# Patient Record
Sex: Male | Born: 1940 | Race: White | Hispanic: No | Smoking: Current every day smoker
Health system: Southern US, Community
[De-identification: ages and names within clinical notes are randomized; demographics above are authoritative.]

## PROBLEM LIST (undated history)

## (undated) DIAGNOSIS — F99 Mental disorder, not otherwise specified: Secondary | ICD-10-CM

## (undated) DIAGNOSIS — G629 Polyneuropathy, unspecified: Secondary | ICD-10-CM

## (undated) DIAGNOSIS — F32A Depression, unspecified: Secondary | ICD-10-CM

## (undated) DIAGNOSIS — F329 Major depressive disorder, single episode, unspecified: Secondary | ICD-10-CM

## (undated) DIAGNOSIS — F419 Anxiety disorder, unspecified: Secondary | ICD-10-CM

## (undated) DIAGNOSIS — F4310 Post-traumatic stress disorder, unspecified: Secondary | ICD-10-CM

## (undated) DIAGNOSIS — I10 Essential (primary) hypertension: Secondary | ICD-10-CM

---

## 1997-10-08 ENCOUNTER — Emergency Department (HOSPITAL_COMMUNITY): Admission: EM | Admit: 1997-10-08 | Discharge: 1997-10-08 | Payer: Self-pay | Admitting: Emergency Medicine

## 1997-11-05 ENCOUNTER — Emergency Department (HOSPITAL_COMMUNITY): Admission: EM | Admit: 1997-11-05 | Discharge: 1997-11-05 | Payer: Self-pay | Admitting: Emergency Medicine

## 1998-11-03 ENCOUNTER — Emergency Department (HOSPITAL_COMMUNITY): Admission: EM | Admit: 1998-11-03 | Discharge: 1998-11-03 | Payer: Self-pay | Admitting: Emergency Medicine

## 1998-11-04 ENCOUNTER — Encounter: Payer: Self-pay | Admitting: Emergency Medicine

## 1998-11-04 ENCOUNTER — Emergency Department (HOSPITAL_COMMUNITY): Admission: EM | Admit: 1998-11-04 | Discharge: 1998-11-04 | Payer: Self-pay | Admitting: Emergency Medicine

## 2001-10-12 ENCOUNTER — Emergency Department (HOSPITAL_COMMUNITY): Admission: EM | Admit: 2001-10-12 | Discharge: 2001-10-13 | Payer: Self-pay

## 2002-04-26 ENCOUNTER — Emergency Department (HOSPITAL_COMMUNITY): Admission: EM | Admit: 2002-04-26 | Discharge: 2002-04-26 | Payer: Self-pay | Admitting: Emergency Medicine

## 2002-08-14 ENCOUNTER — Emergency Department (HOSPITAL_COMMUNITY): Admission: EM | Admit: 2002-08-14 | Discharge: 2002-08-14 | Payer: Self-pay

## 2002-09-21 ENCOUNTER — Encounter: Payer: Self-pay | Admitting: Emergency Medicine

## 2002-09-21 ENCOUNTER — Inpatient Hospital Stay (HOSPITAL_COMMUNITY): Admission: EM | Admit: 2002-09-21 | Discharge: 2002-09-27 | Payer: Self-pay

## 2002-12-31 ENCOUNTER — Emergency Department (HOSPITAL_COMMUNITY): Admission: AD | Admit: 2002-12-31 | Discharge: 2002-12-31 | Payer: Self-pay

## 2003-01-21 ENCOUNTER — Emergency Department (HOSPITAL_COMMUNITY): Admission: EM | Admit: 2003-01-21 | Discharge: 2003-01-21 | Payer: Self-pay | Admitting: Emergency Medicine

## 2003-10-07 ENCOUNTER — Inpatient Hospital Stay (HOSPITAL_COMMUNITY): Admission: EM | Admit: 2003-10-07 | Discharge: 2003-10-08 | Payer: Self-pay | Admitting: Emergency Medicine

## 2003-10-08 ENCOUNTER — Inpatient Hospital Stay (HOSPITAL_COMMUNITY): Admission: AD | Admit: 2003-10-08 | Discharge: 2003-10-12 | Payer: Self-pay | Admitting: Psychiatry

## 2003-10-08 ENCOUNTER — Encounter (INDEPENDENT_AMBULATORY_CARE_PROVIDER_SITE_OTHER): Payer: Self-pay | Admitting: *Deleted

## 2003-10-19 ENCOUNTER — Inpatient Hospital Stay (HOSPITAL_COMMUNITY): Admission: RE | Admit: 2003-10-19 | Discharge: 2003-10-24 | Payer: Self-pay | Admitting: Psychiatry

## 2003-10-20 ENCOUNTER — Emergency Department (HOSPITAL_COMMUNITY): Admission: EM | Admit: 2003-10-20 | Discharge: 2003-10-20 | Payer: Self-pay | Admitting: Emergency Medicine

## 2003-11-02 ENCOUNTER — Emergency Department (HOSPITAL_COMMUNITY): Admission: EM | Admit: 2003-11-02 | Discharge: 2003-11-03 | Payer: Self-pay | Admitting: Emergency Medicine

## 2003-11-23 ENCOUNTER — Inpatient Hospital Stay (HOSPITAL_COMMUNITY): Admission: EM | Admit: 2003-11-23 | Discharge: 2003-11-30 | Payer: Self-pay | Admitting: Psychiatry

## 2006-11-30 ENCOUNTER — Emergency Department (HOSPITAL_COMMUNITY): Admission: EM | Admit: 2006-11-30 | Discharge: 2006-12-01 | Payer: Self-pay | Admitting: Emergency Medicine

## 2006-12-01 ENCOUNTER — Ambulatory Visit: Payer: Self-pay | Admitting: *Deleted

## 2006-12-01 ENCOUNTER — Inpatient Hospital Stay (HOSPITAL_COMMUNITY): Admission: AD | Admit: 2006-12-01 | Discharge: 2006-12-08 | Payer: Self-pay | Admitting: *Deleted

## 2007-01-28 ENCOUNTER — Other Ambulatory Visit: Payer: Self-pay | Admitting: Emergency Medicine

## 2007-01-29 ENCOUNTER — Ambulatory Visit: Payer: Self-pay | Admitting: Psychiatry

## 2007-01-29 ENCOUNTER — Inpatient Hospital Stay (HOSPITAL_COMMUNITY): Admission: AD | Admit: 2007-01-29 | Discharge: 2007-02-04 | Payer: Self-pay | Admitting: Psychiatry

## 2007-02-27 ENCOUNTER — Other Ambulatory Visit: Payer: Self-pay

## 2007-02-28 ENCOUNTER — Ambulatory Visit: Payer: Self-pay | Admitting: Psychiatry

## 2007-02-28 ENCOUNTER — Inpatient Hospital Stay (HOSPITAL_COMMUNITY): Admission: AD | Admit: 2007-02-28 | Discharge: 2007-03-06 | Payer: Self-pay | Admitting: Psychiatry

## 2007-06-16 ENCOUNTER — Emergency Department (HOSPITAL_COMMUNITY): Admission: EM | Admit: 2007-06-16 | Discharge: 2007-06-16 | Payer: Self-pay | Admitting: Emergency Medicine

## 2007-07-19 ENCOUNTER — Emergency Department (HOSPITAL_COMMUNITY): Admission: EM | Admit: 2007-07-19 | Discharge: 2007-07-19 | Payer: Self-pay | Admitting: Emergency Medicine

## 2007-08-04 ENCOUNTER — Other Ambulatory Visit: Payer: Self-pay

## 2007-08-05 ENCOUNTER — Ambulatory Visit: Payer: Self-pay | Admitting: Psychiatry

## 2007-08-05 ENCOUNTER — Inpatient Hospital Stay (HOSPITAL_COMMUNITY): Admission: AD | Admit: 2007-08-05 | Discharge: 2007-08-07 | Payer: Self-pay | Admitting: Psychiatry

## 2007-08-05 ENCOUNTER — Other Ambulatory Visit: Payer: Self-pay | Admitting: Emergency Medicine

## 2007-08-06 ENCOUNTER — Encounter (HOSPITAL_COMMUNITY): Payer: Self-pay | Admitting: Psychiatry

## 2007-08-09 ENCOUNTER — Emergency Department (HOSPITAL_COMMUNITY): Admission: EM | Admit: 2007-08-09 | Discharge: 2007-08-10 | Payer: Self-pay | Admitting: Emergency Medicine

## 2007-09-21 ENCOUNTER — Encounter: Payer: Self-pay | Admitting: Emergency Medicine

## 2007-09-23 ENCOUNTER — Ambulatory Visit: Payer: Self-pay | Admitting: Psychiatry

## 2007-09-23 ENCOUNTER — Inpatient Hospital Stay (HOSPITAL_COMMUNITY): Admission: AD | Admit: 2007-09-23 | Discharge: 2007-09-24 | Payer: Self-pay | Admitting: Psychiatry

## 2007-09-30 ENCOUNTER — Other Ambulatory Visit: Payer: Self-pay | Admitting: Emergency Medicine

## 2007-10-01 ENCOUNTER — Inpatient Hospital Stay (HOSPITAL_COMMUNITY): Admission: AD | Admit: 2007-10-01 | Discharge: 2007-10-09 | Payer: Self-pay | Admitting: Psychiatry

## 2007-11-04 ENCOUNTER — Encounter: Payer: Self-pay | Admitting: Emergency Medicine

## 2007-11-05 ENCOUNTER — Inpatient Hospital Stay (HOSPITAL_COMMUNITY): Admission: AD | Admit: 2007-11-05 | Discharge: 2007-11-13 | Payer: Self-pay | Admitting: *Deleted

## 2008-03-16 ENCOUNTER — Ambulatory Visit: Payer: Self-pay | Admitting: *Deleted

## 2008-03-16 ENCOUNTER — Encounter: Payer: Self-pay | Admitting: Emergency Medicine

## 2008-03-16 ENCOUNTER — Inpatient Hospital Stay (HOSPITAL_COMMUNITY): Admission: RE | Admit: 2008-03-16 | Discharge: 2008-03-21 | Payer: Self-pay | Admitting: *Deleted

## 2008-03-28 ENCOUNTER — Emergency Department (HOSPITAL_COMMUNITY): Admission: EM | Admit: 2008-03-28 | Discharge: 2008-03-28 | Payer: Self-pay | Admitting: Emergency Medicine

## 2008-05-23 ENCOUNTER — Emergency Department (HOSPITAL_COMMUNITY): Admission: EM | Admit: 2008-05-23 | Discharge: 2008-05-23 | Payer: Self-pay | Admitting: Emergency Medicine

## 2008-11-21 ENCOUNTER — Ambulatory Visit: Payer: Self-pay | Admitting: Psychiatry

## 2008-11-21 ENCOUNTER — Inpatient Hospital Stay (HOSPITAL_COMMUNITY): Admission: AD | Admit: 2008-11-21 | Discharge: 2008-11-24 | Payer: Self-pay | Admitting: Psychiatry

## 2008-11-27 ENCOUNTER — Inpatient Hospital Stay (HOSPITAL_COMMUNITY): Admission: EM | Admit: 2008-11-27 | Discharge: 2008-12-01 | Payer: Self-pay | Admitting: Emergency Medicine

## 2008-12-01 ENCOUNTER — Inpatient Hospital Stay (HOSPITAL_COMMUNITY): Admission: RE | Admit: 2008-12-01 | Discharge: 2008-12-07 | Payer: Self-pay | Admitting: Psychiatry

## 2008-12-13 ENCOUNTER — Emergency Department (HOSPITAL_COMMUNITY): Admission: EM | Admit: 2008-12-13 | Discharge: 2008-12-13 | Payer: Self-pay | Admitting: Emergency Medicine

## 2008-12-18 ENCOUNTER — Inpatient Hospital Stay (HOSPITAL_COMMUNITY): Admission: EM | Admit: 2008-12-18 | Discharge: 2008-12-22 | Payer: Self-pay | Admitting: Emergency Medicine

## 2008-12-22 ENCOUNTER — Ambulatory Visit: Payer: Self-pay | Admitting: Psychiatry

## 2008-12-22 ENCOUNTER — Inpatient Hospital Stay (HOSPITAL_COMMUNITY): Admission: AD | Admit: 2008-12-22 | Discharge: 2009-01-04 | Payer: Self-pay | Admitting: Psychiatry

## 2008-12-28 ENCOUNTER — Other Ambulatory Visit: Payer: Self-pay | Admitting: Emergency Medicine

## 2008-12-29 ENCOUNTER — Other Ambulatory Visit (HOSPITAL_COMMUNITY): Payer: Self-pay | Admitting: Psychiatry

## 2008-12-30 ENCOUNTER — Other Ambulatory Visit (HOSPITAL_COMMUNITY): Payer: Self-pay | Admitting: Psychiatry

## 2008-12-31 ENCOUNTER — Other Ambulatory Visit (HOSPITAL_COMMUNITY): Payer: Self-pay | Admitting: Psychiatry

## 2009-01-01 ENCOUNTER — Other Ambulatory Visit (HOSPITAL_COMMUNITY): Payer: Self-pay | Admitting: Psychiatry

## 2009-01-02 ENCOUNTER — Other Ambulatory Visit (HOSPITAL_COMMUNITY): Payer: Self-pay | Admitting: Psychiatry

## 2009-01-03 ENCOUNTER — Other Ambulatory Visit (HOSPITAL_COMMUNITY): Payer: Self-pay | Admitting: Psychiatry

## 2009-01-04 ENCOUNTER — Other Ambulatory Visit (HOSPITAL_COMMUNITY): Payer: Self-pay | Admitting: Psychiatry

## 2009-01-05 ENCOUNTER — Encounter: Payer: Self-pay | Admitting: Emergency Medicine

## 2009-01-07 ENCOUNTER — Ambulatory Visit: Payer: Self-pay | Admitting: Psychiatry

## 2009-01-08 ENCOUNTER — Inpatient Hospital Stay (HOSPITAL_COMMUNITY): Admission: AD | Admit: 2009-01-08 | Discharge: 2009-01-13 | Payer: Self-pay | Admitting: Psychiatry

## 2009-01-15 ENCOUNTER — Emergency Department (HOSPITAL_COMMUNITY): Admission: EM | Admit: 2009-01-15 | Discharge: 2009-01-17 | Payer: Self-pay | Admitting: Emergency Medicine

## 2009-02-08 ENCOUNTER — Emergency Department (HOSPITAL_COMMUNITY): Admission: EM | Admit: 2009-02-08 | Discharge: 2009-02-11 | Payer: Self-pay | Admitting: Emergency Medicine

## 2009-02-11 ENCOUNTER — Emergency Department (HOSPITAL_COMMUNITY): Admission: EM | Admit: 2009-02-11 | Discharge: 2009-02-13 | Payer: Self-pay | Admitting: Emergency Medicine

## 2010-01-08 ENCOUNTER — Encounter: Admission: RE | Admit: 2010-01-08 | Discharge: 2010-01-08 | Payer: Self-pay | Admitting: Oral Surgery

## 2010-01-20 IMAGING — CT CT HEAD W/O CM
1 series · 16 of 30 positions shown, 20 images · non-contrast
Comparison: 12/13/2008

CLINICAL DATA: Headache.

CT HEAD WITHOUT CONTRAST
TECHNIQUE: Contiguous axial images were obtained from the base of
the skull through the vertex without contrast.

[Series 2: headseq 4.8 h45s · axial · 0.43mm/px · z∈[-217,-84]mm · 16 of 30 slices shown, 20 images]
[im 2/30  brain]
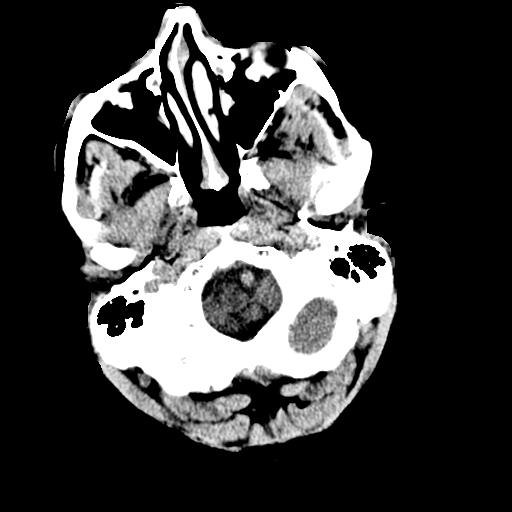
[im 2/30  bone]
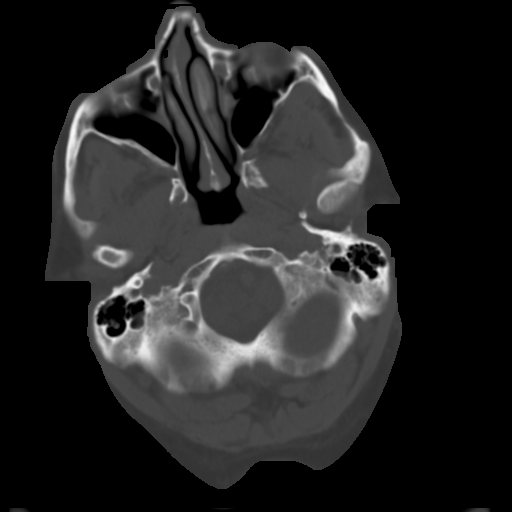
[im 4/30  brain]
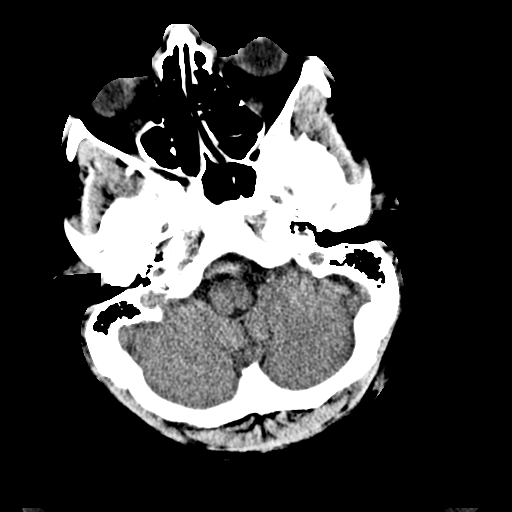
[im 6/30  brain]
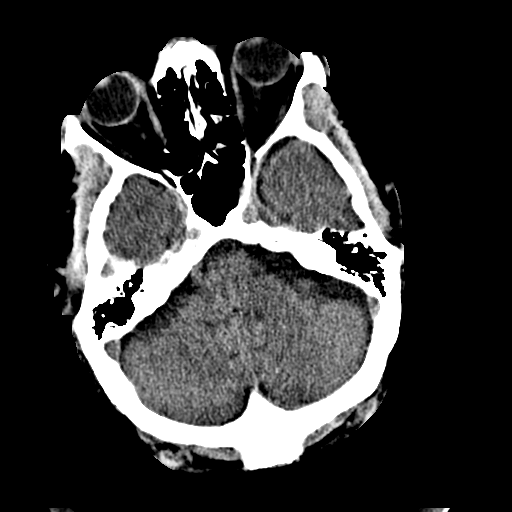
[im 8/30  brain]
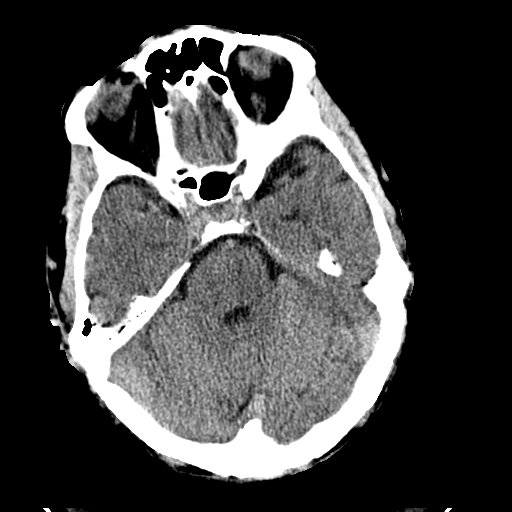
[im 9/30  brain]
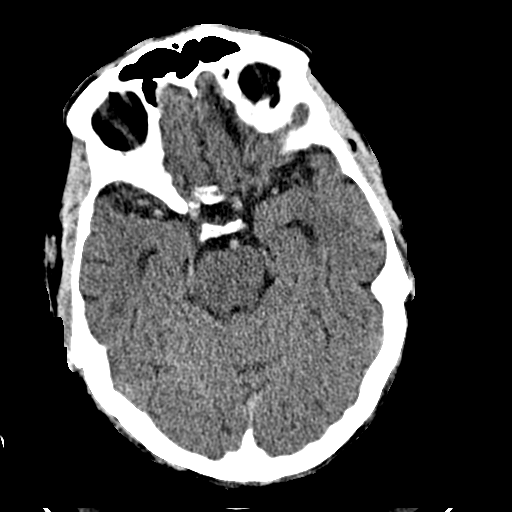
[im 9/30  bone]
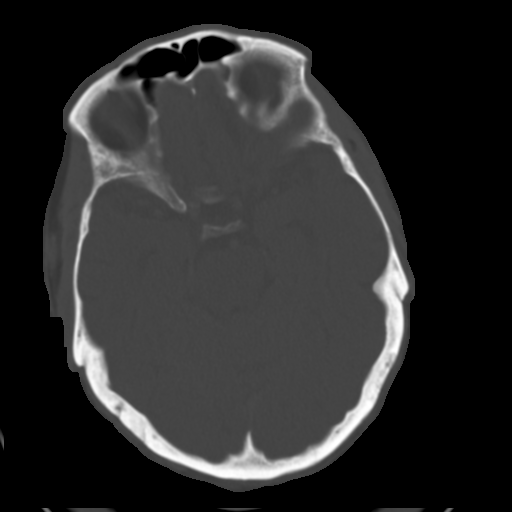
[im 11/30  brain]
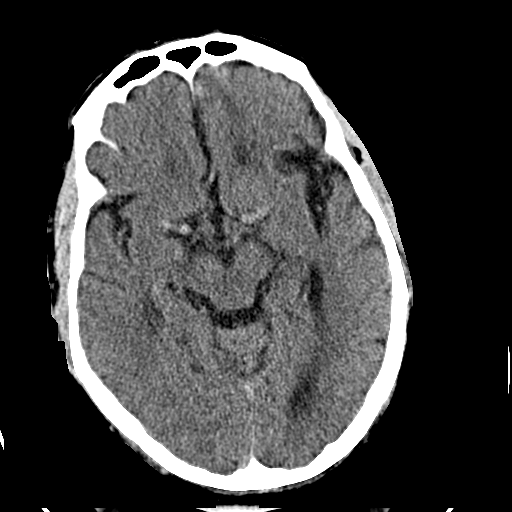
[im 13/30  brain]
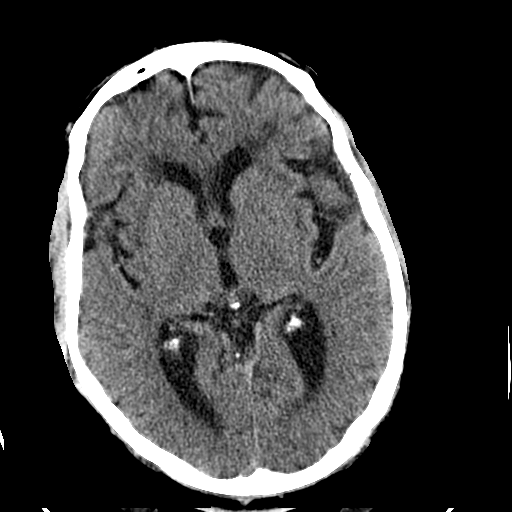
[im 15/30  brain]
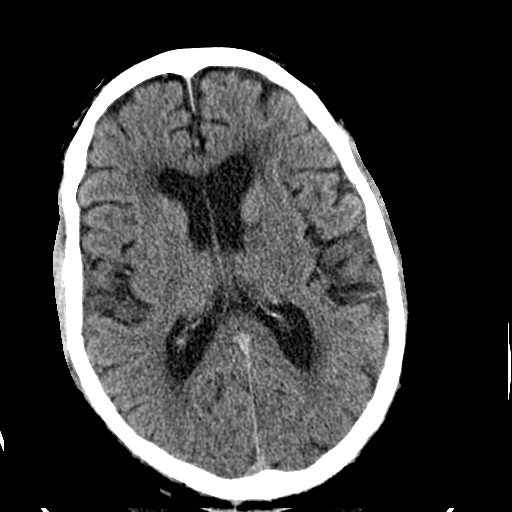
[im 16/30  brain]
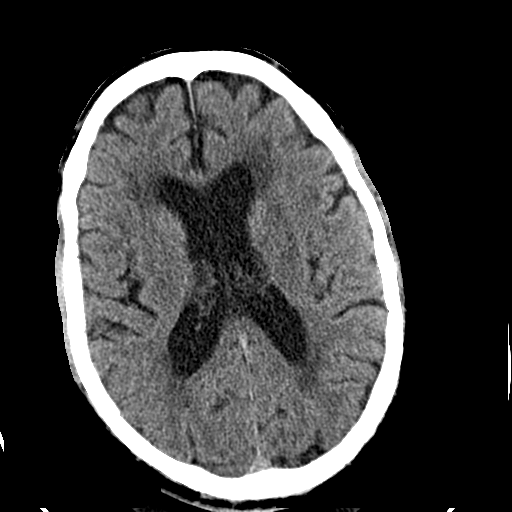
[im 16/30  bone]
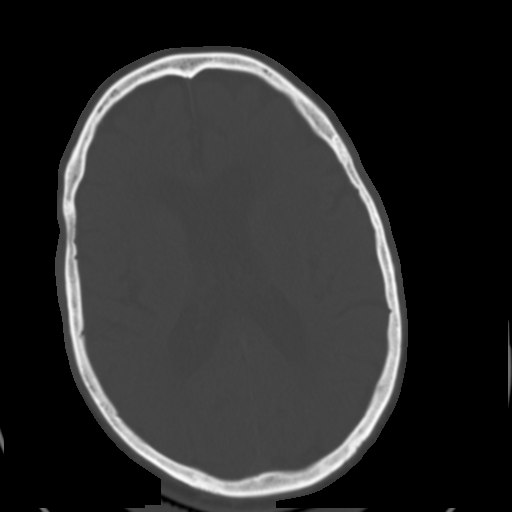
[im 18/30  brain]
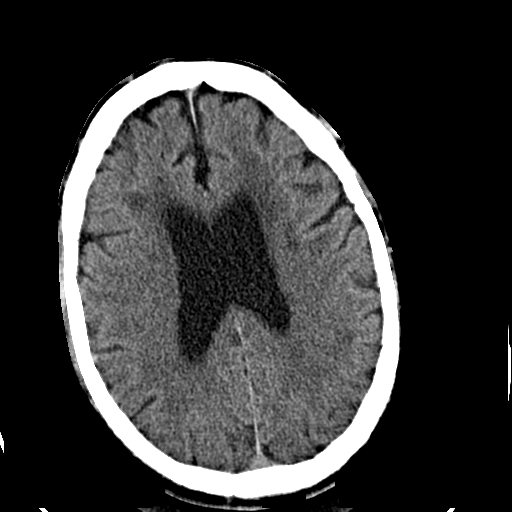
[im 20/30  brain]
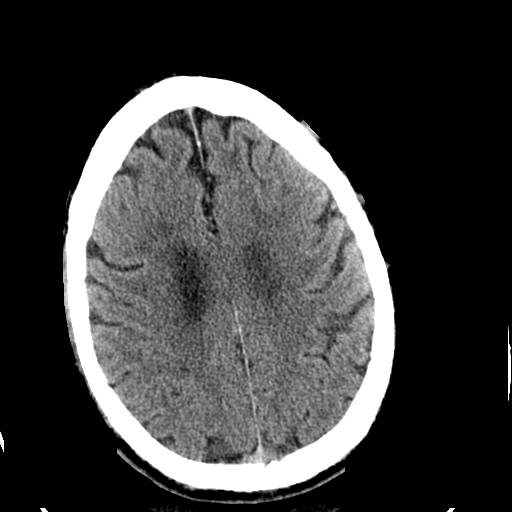
[im 22/30  brain]
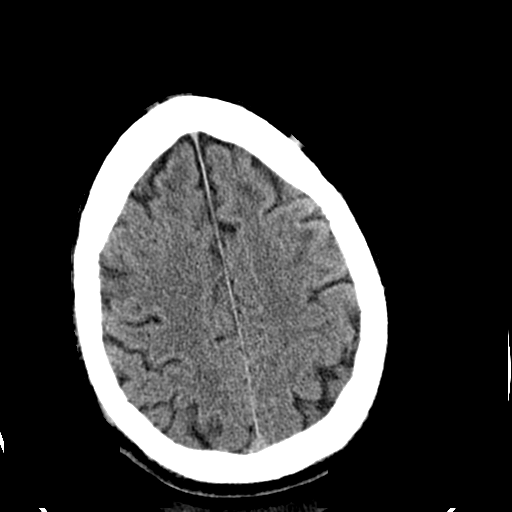
[im 23/30  brain]
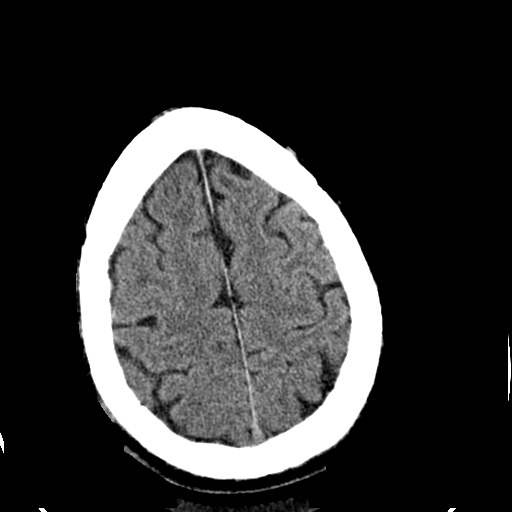
[im 23/30  bone]
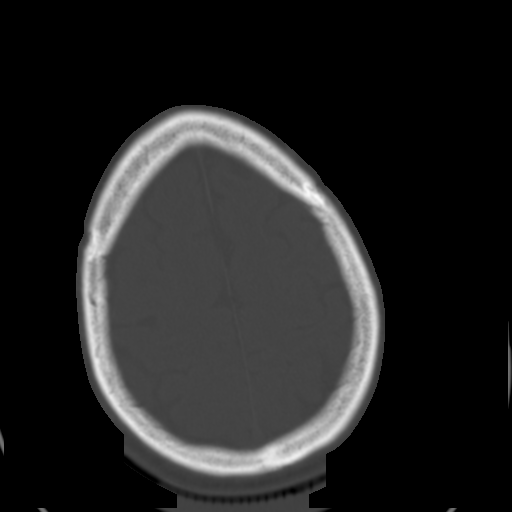
[im 25/30  brain]
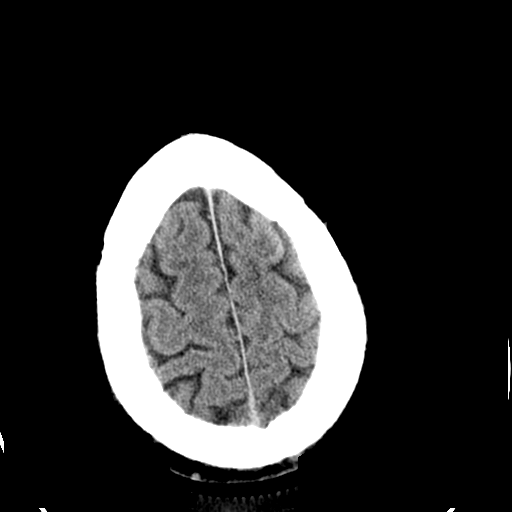
[im 27/30  brain]
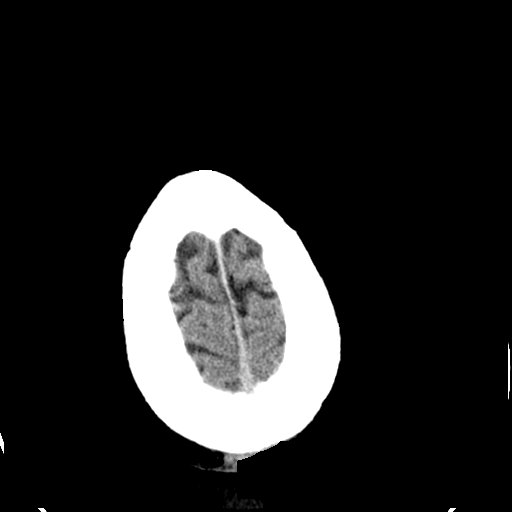
[im 29/30  brain]
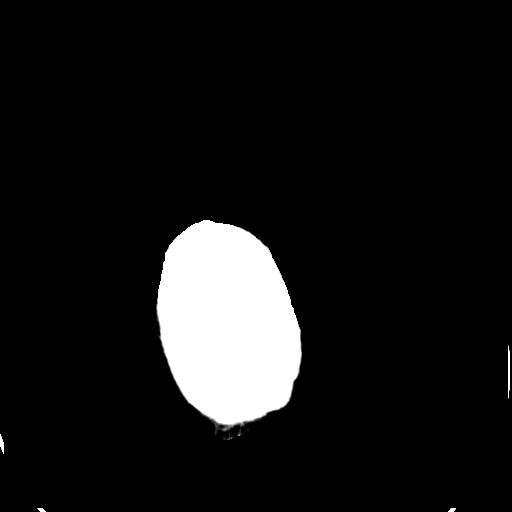

[16 of 30 positions shown; findings below may reference images not displayed]

FINDINGS: Inferior left frontal encephalomalacia and mild chronic
small vessel white matter ischemic changes are again noted.

No acute intracranial abnormalities are identified, including mass
lesion or mass effect, hydrocephalus, extra-axial fluid collection,
midline shift, hemorrhage, or acute infarction.  Please note that
acute infarction may be occult on CT for 24-48 hours.

The visualized bony calvarium is unremarkable.
IMPRESSION: No evidence of acute intracranial abnormality.

Stable chronic small vessel white matter ischemic changes and left
frontal encephalomalacia.

## 2010-02-19 ENCOUNTER — Inpatient Hospital Stay (HOSPITAL_COMMUNITY): Admission: EM | Admit: 2010-02-19 | Discharge: 2010-02-21 | Payer: Self-pay | Admitting: Emergency Medicine

## 2010-02-19 DIAGNOSIS — F1020 Alcohol dependence, uncomplicated: Secondary | ICD-10-CM

## 2010-02-21 DIAGNOSIS — F1020 Alcohol dependence, uncomplicated: Secondary | ICD-10-CM

## 2010-04-22 ENCOUNTER — Emergency Department (HOSPITAL_COMMUNITY)
Admission: EM | Admit: 2010-04-22 | Discharge: 2010-04-22 | Payer: Self-pay | Source: Home / Self Care | Admitting: Emergency Medicine

## 2010-04-23 ENCOUNTER — Emergency Department (HOSPITAL_COMMUNITY)
Admission: EM | Admit: 2010-04-23 | Discharge: 2010-04-23 | Payer: Self-pay | Source: Home / Self Care | Admitting: Emergency Medicine

## 2010-04-30 LAB — DIFFERENTIAL
Basophils Absolute: 0 10*3/uL (ref 0.0–0.1)
Basophils Relative: 0 % (ref 0–1)
Eosinophils Absolute: 0 10*3/uL (ref 0.0–0.7)
Eosinophils Relative: 0 % (ref 0–5)
Lymphocytes Relative: 14 % (ref 12–46)
Lymphs Abs: 1.7 10*3/uL (ref 0.7–4.0)
Monocytes Absolute: 0.7 10*3/uL (ref 0.1–1.0)
Monocytes Relative: 6 % (ref 3–12)
Neutro Abs: 9.5 10*3/uL — ABNORMAL HIGH (ref 1.7–7.7)
Neutrophils Relative %: 80 % — ABNORMAL HIGH (ref 43–77)

## 2010-04-30 LAB — CBC
HCT: 44.9 % (ref 39.0–52.0)
Hemoglobin: 15 g/dL (ref 13.0–17.0)
MCH: 29.6 pg (ref 26.0–34.0)
MCHC: 33.4 g/dL (ref 30.0–36.0)
MCV: 88.6 fL (ref 78.0–100.0)
Platelets: 324 10*3/uL (ref 150–400)
RBC: 5.07 MIL/uL (ref 4.22–5.81)
RDW: 18.2 % — ABNORMAL HIGH (ref 11.5–15.5)
WBC: 11.9 10*3/uL — ABNORMAL HIGH (ref 4.0–10.5)

## 2010-04-30 LAB — GLUCOSE, CAPILLARY: Glucose-Capillary: 169 mg/dL — ABNORMAL HIGH (ref 70–99)

## 2010-04-30 LAB — URINALYSIS, ROUTINE W REFLEX MICROSCOPIC
Bilirubin Urine: NEGATIVE
Hgb urine dipstick: NEGATIVE
Ketones, ur: NEGATIVE mg/dL
Leukocytes, UA: NEGATIVE
Nitrite: NEGATIVE
Protein, ur: NEGATIVE mg/dL
Specific Gravity, Urine: 1.027 (ref 1.005–1.030)
Urine Glucose, Fasting: >1000 mg/dL — AB
Urobilinogen, UA: 0.2 mg/dL (ref 0.0–1.0)
pH: 7 (ref 5.0–8.0)

## 2010-04-30 LAB — ETHANOL: Alcohol, Ethyl (B): <5 mg/dL (ref 0–10)

## 2010-04-30 LAB — URINE MICROSCOPIC-ADD ON

## 2010-04-30 LAB — BASIC METABOLIC PANEL
BUN: 10 mg/dL (ref 6–23)
CO2: 26 mEq/L (ref 19–32)
Calcium: 10 mg/dL (ref 8.4–10.5)
Chloride: 97 mEq/L (ref 96–112)
Creatinine, Ser: 0.98 mg/dL (ref 0.4–1.5)
GFR calc Af Amer: >60 mL/min (ref >60)
GFR calc non Af Amer: >60 mL/min (ref >60)
Glucose, Bld: 212 mg/dL — ABNORMAL HIGH (ref 70–99)
Potassium: 5 mEq/L (ref 3.5–5.1)
Sodium: 132 mEq/L — ABNORMAL LOW (ref 135–145)

## 2010-04-30 LAB — RAPID URINE DRUG SCREEN, HOSP PERFORMED
Amphetamines: NOT DETECTED
Barbiturates: NOT DETECTED
Benzodiazepines: POSITIVE — AB
Cocaine: NOT DETECTED
Opiates: NOT DETECTED
Tetrahydrocannabinol: NOT DETECTED

## 2010-05-04 ENCOUNTER — Inpatient Hospital Stay (HOSPITAL_COMMUNITY)
Admission: EM | Admit: 2010-05-04 | Discharge: 2010-05-10 | Disposition: A | Discharge status: Other Acute Inpatient Hospital | Payer: Self-pay | Source: Home / Self Care | Attending: Internal Medicine | Admitting: Internal Medicine

## 2010-05-06 ENCOUNTER — Encounter: Payer: Self-pay | Admitting: Emergency Medicine

## 2010-05-06 DIAGNOSIS — F1020 Alcohol dependence, uncomplicated: Secondary | ICD-10-CM

## 2010-05-06 DIAGNOSIS — F39 Unspecified mood [affective] disorder: Secondary | ICD-10-CM

## 2010-05-06 NOTE — Consult Note (Addendum)
NAME:  Ernest [name], FAUBLE NO.:  [id number]  MEDICAL RECORD NO.:  [id number]          PATIENT TYPE:  INP  LOCATION:  0102                         FACILITY:  [facility]  PHYSICIAN:  Orbie Hurst, Naiya Corral         DATE OF BIRTH:  [birth year]  DATE OF CONSULTATION: DATE OF DISCHARGE:                                CONSULTATION   REFERRING PHYSICIAN:  April Palumbo-Rasch, Dylin Ihnen  REASON FOR ADMISSION:  Acute respiratory failure, benzodiazepine overdose, and alcohol abuse.  HISTORY OF PRESENT ILLNESS:  The patient is a 70 year old Caucasian male with a past medical history of COPD, diabetes, dyslipidemia, hypertension, osteoarthritis, peripheral neuropathy, and history of chronic hyponatremia and mood disorder NOS, and history of suicidal attempts, who was admitted to [facility] due to altered mental status.  As per the medical records, the patient has history of alcohol abuse/dependence and a bottle of Xanax with prescription for 90 pills filled yesterday and only 12 pills remained in the bottle.  The patient presented to [facility] with altered mental status and he was intubated for airway protection.  Further workup showed urine tox screen positive for benzodiazepines and negative for alcohol.  There are no records of seizures or alcohol withdrawal symptoms.  He was recently admitted to the hospital in November/2011 due to suicidal ideation and relapse of alcohol abuse.  The ABG after intubation showed pH 7.31, pCO2 41, pO2 96, bicarb 22 on AC 15, respiratory rate 15, tidal volume 550, PEEP of 5, and FiO2 of 50%. Initial chest x-ray showed bibasilar infiltrates.  The patient was started on broad-spectrum antibiotics with vancomycin and Zosyn.  A pulmonary critical care consult was requested due to acute respiratory failure secondary to benzodiazepine overdose, and history of alcohol abuse.  REVIEW OF SYSTEMS:  All disease systems were negative except as  above in HPI.  PAST MEDICAL HISTORY: 1. COPD. 2. Diabetes. 3. Dyslipidemia. 4. Hypertension. 5. Osteoarthritis. 6. History of peripheral neuropathy. 7. History of hypernatremia. 8. History of suicidal attempts requiring hospital admissions and mood     disorder NOS. 9. History of alcohol abuse/dependence.  ALLERGIES:  No known drug allergies.  SOCIAL HISTORY:  The patient smokes as per medical records.  He drinks alcohol daily and there is no history of previous drug use.  He lives in nursing home.  FAMILY HISTORY:  Noncontributory.  HOME MEDICATIONS: 1. Advair Diskus. 2. Aleve. 3. Celebrex. 4. Cymbalta. 5. Folic acid. 6. Glipizide. 7. Lisinopril. 8. Magnesium oxide. 9. Metformin. 10.Neurontin. 11.Omega-3 fish oil. 12.Percocet. 13.Protonix. 14.Thiamine. 15.Trazodone. 16.Xanax.  PHYSICAL EXAM:  GENERAL:  The patient is intubated, on ventilatory support, critically ill,  on sedation. VITAL SIGNS:  Blood pressure 108/70, respiratory rate 20, heart rate 88, temperature 37.  O2 saturation 98% on AC 15/550/5/50% of O2. HEENT:  Oral mucosa is moist without JVD.  No cervical or supraclavicular lymphadenopathy. CARDIOVASCULAR:  Regular rhythm.  S1 and S2  normal. LUNGS:  Decreased breath sounds in pulmonary bases with positive rhonchi bilaterally and mild wheezing. ABDOMEN:  Soft, nontender, nondistended.  Bowel sounds active.  No peritoneal signs. EXTREMITIES:  No lower extremity  edema.  No calf tenderness. NEUROLOGIC:  The patient is under sedation with no focal symptoms.  LABORATORY AND IMAGING DATA:  CBC; WBC 9.4, hemoglobin 12.4, platelet count 240,000.  Urine drug screen positive for benzodiazepines, negative for amphetamines, cocaine, opiate, or tetrahydrocannabinol.  Alcohol level less than 6. CMP; sodium 129, potassium 4.4, chloride 100, bicarb 25, glucose 189, BUN 10, creatinine 0.83, total bilirubin 0.4, alkaline phosphatase 50, AST 14, ALT 18, calcium  9.3, albumin 3.6.  Acetaminophen less than 10, salicylate less than 4.  ABG 7.31, pCO2 45, pO2 96, bicarb 22 on AC 15/550/5/50%.  Chest x-ray portable 1 view  May 04, 2010 with endotracheal tube in appropriate position with bilateral lower lobe infiltrates.  ASSESSMENT:  The patient is a 70 year old male with past medical history of chronic obstructive pulmonary disease, alcohol abuse/dependence, history of diabetes, dyslipidemia, hypertension, osteoarthritis, who comes to [facility] due to altered mental status with benzodiazepines overdose and who needed to be intubated for airway protection and due to acute respiratory failure.  The initial chest x- ray shows bibasilar infiltrates and the patient was started on broad- spectrum antibiotics. 1. Acute respiratory failure.  Likely due to benzo overdose.  The     patient if now under sedation with propofol and he will be started     on fentanyl drip.  He will continue on ventilatory support for now.     We will recheck a chest x-ray in the morning.  We will get     respiratory cultures and blood cultures.  The patient will continue     vancomycin and Zosyn due to bibasilar infiltrates.  We have to rule    out aspiration pneumonitis due to altered mental status and history     of alcohol abuse.  The patient will get ABG in the morning. 2. Pneumonia.  We will get tracheal aspirate and sent for culture and     blood cultures.  Continue vancomycin and Zosyn for now.  Will     follow chest x-ray and ABG.  The patient will be treated for     hospital-acquired pneumonia because he was admitted to the hospital     in November of 2011.  Again, we have to rule out aspiration     pneumonitis due to history of altered mental status and alcohol     abuse. 3. History of chronic obstructive pulmonary disease.  The patient will     be started on Solu-Medrol and we will continue     albuterol/ipratropium.  Will continue pulmonary toilet. 4.  Diabetes.  The patient will be started on the ICU hyperglycemia     protocol.  We will get a hemoglobin A1c.  We will hold oral     medications for now. 5. Hyponatremia.  The patient has the most recent sodium level of 134     after IV fluids hydration.  Likely due to dehydration.  We will     follow BMP in the morning. 6. History of alcohol abuse.  The patient will continue on sedation     with propofol and fentanyl.  Then, the patient will be started on     CIWA protocol.  Will start a banana bag with thiamine and     multivitamins and folic acid.  We will check neuro checks every     hour.  As per medical records, the patient had the last drink on     June 14, 2010.  We will  be monitoring for alcohol withdrawal. 7. DVT and GI prophylaxis, Lovenox and Protonix IV.  CODE STATUS:  Full code.  TOTAL CRITICAL CARE TIME:  60 minutes.     Orbie Hurst, Hue Steveson     JR/MEDQ  D:  05/05/2010  T:  05/05/2010  Job:  [id number]  Electronically Signed by Orbie Hurst M.D. on 05/06/2010 10:35:46 PM

## 2010-05-07 LAB — BLOOD GAS, ARTERIAL
Bicarbonate: 22.2 mEq/L (ref 20.0–24.0)
Drawn by: 257701
FIO2: 0.8 %
MECHVT: 550 mL
O2 Saturation: 97.2 %
Patient temperature: 98.6
TCO2: 20.3 mmol/L (ref 0–100)
pCO2 arterial: 45.1 mmHg — ABNORMAL HIGH (ref 35.0–45.0)

## 2010-05-07 LAB — RAPID URINE DRUG SCREEN, HOSP PERFORMED
Amphetamines: NOT DETECTED
Benzodiazepines: POSITIVE — AB

## 2010-05-07 LAB — DIFFERENTIAL
Basophils Relative: 0 % (ref 0–1)
Lymphocytes Relative: 21 % (ref 12–46)
Monocytes Absolute: 0.6 10*3/uL (ref 0.1–1.0)
Monocytes Relative: 6 % (ref 3–12)
Neutro Abs: 6.4 10*3/uL (ref 1.7–7.7)

## 2010-05-07 LAB — URINALYSIS, ROUTINE W REFLEX MICROSCOPIC
Bilirubin Urine: NEGATIVE
Bilirubin Urine: NEGATIVE
Hgb urine dipstick: NEGATIVE
Hgb urine dipstick: NEGATIVE
Ketones, ur: NEGATIVE mg/dL
Protein, ur: NEGATIVE mg/dL
Protein, ur: NEGATIVE mg/dL
Urine Glucose, Fasting: 500 mg/dL — AB
Urobilinogen, UA: 0.2 mg/dL (ref 0.0–1.0)

## 2010-05-07 LAB — COMPREHENSIVE METABOLIC PANEL
Albumin: 3.6 g/dL (ref 3.5–5.2)
BUN: 10 mg/dL (ref 6–23)
Creatinine, Ser: 0.83 mg/dL (ref 0.4–1.5)
Glucose, Bld: 189 mg/dL — ABNORMAL HIGH (ref 70–99)
Total Bilirubin: 0.4 mg/dL (ref 0.3–1.2)
Total Protein: 6.7 g/dL (ref 6.0–8.3)

## 2010-05-07 LAB — CBC
HCT: 37.4 % — ABNORMAL LOW (ref 39.0–52.0)
Hemoglobin: 12.4 g/dL — ABNORMAL LOW (ref 13.0–17.0)
MCH: 29 pg (ref 26.0–34.0)
MCHC: 33.2 g/dL (ref 30.0–36.0)
MCV: 87.6 fL (ref 78.0–100.0)

## 2010-05-07 LAB — HEPATIC FUNCTION PANEL
ALT: 20 U/L (ref 0–53)
AST: 17 U/L (ref 0–37)
Albumin: 3.8 g/dL (ref 3.5–5.2)
Alkaline Phosphatase: 59 U/L (ref 39–117)
Total Protein: 7.1 g/dL (ref 6.0–8.3)

## 2010-05-07 LAB — GLUCOSE, CAPILLARY: Glucose-Capillary: 153 mg/dL — ABNORMAL HIGH (ref 70–99)

## 2010-05-07 LAB — POCT I-STAT, CHEM 8
BUN: 11 mg/dL (ref 6–23)
Creatinine, Ser: 1 mg/dL (ref 0.4–1.5)
Sodium: 134 mEq/L — ABNORMAL LOW (ref 135–145)
TCO2: 24 mmol/L (ref 0–100)

## 2010-05-07 LAB — ACETAMINOPHEN LEVEL: Acetaminophen (Tylenol), Serum: <10 ug/mL — ABNORMAL LOW (ref 10–30)

## 2010-05-07 LAB — SALICYLATE LEVEL: Salicylate Lvl: <4 mg/dL (ref 2.8–20.0)

## 2010-05-07 LAB — ETHANOL: Alcohol, Ethyl (B): 6 mg/dL (ref 0–10)

## 2010-05-08 LAB — BLOOD GAS, ARTERIAL
Acid-Base Excess: 0.1 mmol/L (ref 0.0–2.0)
Drawn by: 317871
FIO2: 0.5 %
MECHVT: 0.55 mL
O2 Saturation: 98.5 %
RATE: 18 resp/min
pO2, Arterial: 101 mmHg — ABNORMAL HIGH (ref 80.0–100.0)

## 2010-05-08 LAB — CBC
HCT: 36.7 % — ABNORMAL LOW (ref 39.0–52.0)
HCT: 35.9 % — ABNORMAL LOW (ref 39.0–52.0)
MCV: 87.4 fL (ref 78.0–100.0)
Platelets: 250 10*3/uL (ref 150–400)
RBC: 4.11 MIL/uL — ABNORMAL LOW (ref 4.22–5.81)
RDW: 17.6 % — ABNORMAL HIGH (ref 11.5–15.5)
RDW: 17.8 % — ABNORMAL HIGH (ref 11.5–15.5)
WBC: 9.3 10*3/uL (ref 4.0–10.5)
WBC: 11.3 10*3/uL — ABNORMAL HIGH (ref 4.0–10.5)

## 2010-05-08 LAB — GLUCOSE, CAPILLARY
Glucose-Capillary: 200 mg/dL — ABNORMAL HIGH (ref 70–99)
Glucose-Capillary: 202 mg/dL — ABNORMAL HIGH (ref 70–99)
Glucose-Capillary: 211 mg/dL — ABNORMAL HIGH (ref 70–99)
Glucose-Capillary: 228 mg/dL — ABNORMAL HIGH (ref 70–99)
Glucose-Capillary: 271 mg/dL — ABNORMAL HIGH (ref 70–99)
Glucose-Capillary: 188 mg/dL — ABNORMAL HIGH (ref 70–99)
Glucose-Capillary: 201 mg/dL — ABNORMAL HIGH (ref 70–99)
Glucose-Capillary: 204 mg/dL — ABNORMAL HIGH (ref 70–99)
Glucose-Capillary: 243 mg/dL — ABNORMAL HIGH (ref 70–99)
Glucose-Capillary: 251 mg/dL — ABNORMAL HIGH (ref 70–99)

## 2010-05-08 LAB — BASIC METABOLIC PANEL
BUN: 7 mg/dL (ref 6–23)
Chloride: 105 mEq/L (ref 96–112)
GFR calc non Af Amer: >60 mL/min (ref >60)
GFR calc non Af Amer: >60 mL/min (ref >60)
Glucose, Bld: 155 mg/dL — ABNORMAL HIGH (ref 70–99)
Potassium: 4.4 mEq/L (ref 3.5–5.1)
Potassium: 4.5 mEq/L (ref 3.5–5.1)
Sodium: 139 mEq/L (ref 135–145)

## 2010-05-08 LAB — CULTURE, RESPIRATORY W GRAM STAIN

## 2010-05-08 LAB — HEMOGLOBIN A1C
Hgb A1c MFr Bld: 7.5 % — ABNORMAL HIGH (ref <5.7)
Mean Plasma Glucose: 169 mg/dL — ABNORMAL HIGH (ref <117)

## 2010-05-08 LAB — URINE CULTURE: Colony Count: NO GROWTH

## 2010-05-09 DIAGNOSIS — Z046 Encounter for general psychiatric examination, requested by authority: Secondary | ICD-10-CM

## 2010-05-09 LAB — CBC
Platelets: 291 10*3/uL (ref 150–400)
RBC: 4.81 MIL/uL (ref 4.22–5.81)
RDW: 17 % — ABNORMAL HIGH (ref 11.5–15.5)
WBC: 8.8 10*3/uL (ref 4.0–10.5)

## 2010-05-09 LAB — DIFFERENTIAL
Basophils Absolute: 0 10*3/uL (ref 0.0–0.1)
Basophils Relative: 0 % (ref 0–1)
Eosinophils Absolute: 0.3 10*3/uL (ref 0.0–0.7)
Lymphs Abs: 2.4 10*3/uL (ref 0.7–4.0)
Neutrophils Relative %: 61 % (ref 43–77)

## 2010-05-09 LAB — COMPREHENSIVE METABOLIC PANEL
AST: 13 U/L (ref 0–37)
Albumin: 3.4 g/dL — ABNORMAL LOW (ref 3.5–5.2)
Alkaline Phosphatase: 37 U/L — ABNORMAL LOW (ref 39–117)
Chloride: 100 mEq/L (ref 96–112)
GFR calc Af Amer: >60 mL/min (ref >60)
Potassium: 4.1 mEq/L (ref 3.5–5.1)
Sodium: 135 mEq/L (ref 135–145)
Total Bilirubin: 0.6 mg/dL (ref 0.3–1.2)
Total Protein: 6.9 g/dL (ref 6.0–8.3)

## 2010-05-09 LAB — GLUCOSE, CAPILLARY
Glucose-Capillary: 307 mg/dL — ABNORMAL HIGH (ref 70–99)
Glucose-Capillary: 217 mg/dL — ABNORMAL HIGH (ref 70–99)
Glucose-Capillary: 217 mg/dL — ABNORMAL HIGH (ref 70–99)
Glucose-Capillary: 257 mg/dL — ABNORMAL HIGH (ref 70–99)
Glucose-Capillary: 213 mg/dL — ABNORMAL HIGH (ref 70–99)
Glucose-Capillary: 339 mg/dL — ABNORMAL HIGH (ref 70–99)
Glucose-Capillary: 162 mg/dL — ABNORMAL HIGH (ref 70–99)

## 2010-05-09 LAB — HEMOGLOBIN A1C: Hgb A1c MFr Bld: 7.8 % — ABNORMAL HIGH (ref <5.7)

## 2010-05-09 NOTE — Consult Note (Addendum)
NAME:  SELIG, [name] NO.:  [id number]  MEDICAL RECORD NO.:  [id number]          PATIENT TYPE:  INP  LOCATION:  1235                         FACILITY:  [facility]  PHYSICIAN:  Marlis Edelson, DO        DATE OF BIRTH:  [birth year]  DATE OF CONSULTATION:  05/06/2010 DATE OF DISCHARGE:                                CONSULTATION   REFERRING PHYSICIAN:  Dr. Delford Field.  REASON FOR CONSULTATION:  Involuntary commitment.  HISTORY OF CURRENT ILLNESS:  Ernest [name] is a 70 year old Caucasian male admitted to the [facility] on May 05, 2010 status post overdose..  The patient had been violent and aggressive in behaviors, required intubation due to acute respiratory failure secondary to benzodiazepine overdose.  It is reported that the gentleman apparently had taken a bottle of Xanax.  There was a prescription for 90 pills which had been filled prior to his admission and only 12 pills were remaining in the bottle.  He has had a history of numerous suicidal attempts and psychiatric admissions in the past and a longstanding history of alcohol dependence.  His urine drug screen was positive for benzodiazepines at the time of admission.  He had no alcohol in his system.  There was no known history of DTs or alcohol withdrawal seizures.  He had most recently been in the hospital in November 2011 due to suicidal ideation and relapsing to his alcohol abuse.  Ernest [name] was extubated on the morning of consultation.  He has been quite angry and cantankerous, had been progressive towards staff with a great deal of profanity and racial slurs towards minorities.  He reported to this examiner that he is currently been staying at [facility] and that he takes Cymbalta for depression and that it helps.  He had also been taking Percocet for pain and Xanax for his nerves, trazodone for sleep.  His biggest focus at the time of the interview was a  concern over his electric power chair.  He did not knew where the chair was at.  He was asking nursing staff to call the facility where he was residing in order to see if they could obtain the whereabouts of his chair.  He was minimally cooperative with the assessment.  He was a poor reporter.  PAST PSYCHIATRIC HISTORY:  Alcohol dependency, longstanding history of mood disorder likely secondary to his alcohol dependence, history of suicidal attempts,  numerous psychiatric admissions to the [facility].  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  ALLERGIES:  He is previously listed as having no known drug allergies. At present he has been restricted from MORPHINE SULFATE.  MEDICATIONS:  His home medications included Advair Diskus, Aleve, Celebrex, Cymbalta, folic acid, glipizide, lisinopril, magnesium oxide, metformin, Neurontin, omega-3 fish oil, Percocet, Protonix, thiamine, trazodone and Xanax.  SOCIAL HISTORY:  He relates that he is a widower.  He has no children. He is retired having formerly worked as a Psychologist, occupational.  He currently resides at the [facility].  He is a [place] [employer] veteran, having served 2 tours in [place].  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  Per the chart, he has had no significant legal history.  SUBSTANCE ABUSE HISTORY:  In addition to chronic alcohol consumption, the patient has been a smoker.  It does not appear that he has used illicit drugs.  FAMILY HISTORY:  When asking about family history, he stated that "I did not need to know about his family," and then he commented "they died of strokes."  MENTAL STATUS EXAM:  The patient was examined in his hospital room where he was  lying in the bed.  He had IV medications infusing.  He was wearing glasses and a gown.  His eye contact was fair.  Speech was clear, coherent; regular in rate, rhythm, volume and tone.  His mood was "I am not a happy camper."  His affect was constricted but he was noted to be quite labile.  He could become easily agitated and became cursing at any provocation.  His thought process was superficial.  Thought content, he denies current suicidal or homicidal thought, intent or plan.  He was denying perceptual symptoms, ideas of reference, or paranoia.  Judgment is impaired.  Insight shallow.  Psychomotor activity was within normal limits.  Orientation, he read from a calendar stating it is either the May 05, 2010 or May 06, 2010.  He did state it was 2012 and that we were in [facility].  He did not know the name the facility where he was currently at, nor the floor that he was on. He did state he was in [place], [place].  ASSESSMENT:  AXIS I: 1. Status post benzodiazepine overdose with respiratory failure. 2. Delirium secondary to overdose. 3. Chronic alcohol dependence. 4. Substance-induced mood disorder, chronic. AXIS II:  Deferred. AXIS III:  Per past medical history. AXIS IV:  Currently living in a health care facility.  I am uncertain if this is a nursing home or an assisted living facility. AXIS V:  20  RECOMMENDATIONS:  I do agree with his current medical management.  He will need ongoing hospitalization until stable.  In addition, he will need a social work consultation to determine his current living situation.  It appears by this gentleman's past history and current level of functioning that he needs to be in a facility where medications can be administered to him.  I am in disagreement with the use of the Xanax particularly given his history of overdose, his repetitive history of suicidal ideation and his longstanding history of drug dependency.  If he  was to require a benzodiazepine for anxiety, then medicine should be administered only if he is in a care facility where that can be administered by professional staff.  It is my understanding that the patient has been involuntarily committed and I concur with that decision based on his current mental status.  Thank you for allowing me to see Ernest [name].          ______________________________ Marlis Edelson, DO     DB/MEDQ  D:  05/06/2010  T:  05/06/2010  Job:  [id number]  Electronically Signed by Marlis Edelson MD on 05/09/2010 10:14:30 PM

## 2010-05-10 ENCOUNTER — Inpatient Hospital Stay (HOSPITAL_COMMUNITY)
Admission: AD | Admit: 2010-05-10 | Discharge: 2010-05-11 | Payer: Self-pay | Source: Ambulatory Visit | Attending: Psychiatry | Admitting: Psychiatry

## 2010-05-10 LAB — GLUCOSE, CAPILLARY: Glucose-Capillary: 212 mg/dL — ABNORMAL HIGH (ref 70–99)

## 2010-05-10 NOTE — Discharge Summary (Addendum)
NAME:  Ernest [name], Ernest [name] NO.:  [id number]  MEDICAL RECORD NO.:  [id number]          PATIENT TYPE:  INP  LOCATION:  1517                         FACILITY:  [facility]  PHYSICIAN:  Rock Nephew, MD       DATE OF BIRTH:  04-01-1941  DATE OF ADMISSION:  05/05/2010 DATE OF DISCHARGE:  05/10/2010                        DISCHARGE SUMMARY - REFERRING   PRIMARY CARE PHYSICIAN:  [facility] [place], [place].  DISCHARGE DIAGNOSES: 1. Status post ventilator-dependent respiratory failure secondary to benzodiazepine overdose, presumed suicide attempt. 2. Status post suicide attempt, no longer suicidal. 3. Alcohol dependence, stable. 4. History of aggressive behavior. 5. History of chronic obstructive pulmonary disease.  PRESUMED DIAGNOSES: 1. Diabetes mellitus. 2. Chronic pain. 3. History of hypertension. 4. History of tobacco abuse. 5. History of hyperlipidemia.  DISCHARGE MEDICATIONS: 1. Ipratropium bromide one-half mg inhaled every 6 hours as needed. 2. Multivitamin 1 tablet p.o. daily. 3. Nicotine patch 21 mg 24 hours. 4. Oxycodone 5 mg by mouth every 4 hours as needed for pain. 5. Advair 250/50 two puffs inhaled twice daily. 6. Aleve 220 mg 1 tablet every 8 hours as needed. 7. Biofreeze one application topically 3 times a day. 8. Cymbalta 60 mg 1 capsule by mouth daily. 9. Folic acid 1 mg p.o. daily. 10.Gabapentin 300 mg 2 capsules by mouth daily. 11.Garlic extract 5284 mg 1 tablet by mouth daily. 12.Ginger root 550 mg 1 tablet by mouth daily. 13.Glipizide 5 mg p.o. daily. 14.Lisinopril 10 mg p.o. daily. 15.Magnesium oxide 400 mg p.o. daily. 16.Metformin 500 mg 1 tablet by mouth twice daily. 17.Omega 3 acid esterase 1 tablet p.o. daily. 18.Protonix 40 mg p.o. daily. 19.Trazodone 100 mg p.o. q.h.s. p.r.n. sleep. 20.Vitamin B1 100 mg p.o. daily.  DISPOSITION:  The patient is discharged to [facility].  The patient's diet  is carbohydrate-modified.  FOLLOW UP:  The patient should follow up with Dr. Eustaquio Maize in about 1 week and further recommendations per [facility].  The patient should also have an outpatient follow up with a pulmonologist to have PFTs to establish a diagnosis of COPD.  CONSULTATIONS:  Eulogio Ditch, MD  Patient was previously on the pulmonary critical care service.  The patient has procedure is performed.  The patient has been intubated. The patient has had multiple chest x-rays done.  The patient was also on Precedex drip.  The patient has had a CT scan of the head, which showed no acute intracranial abnormality.  INITIAL HISTORY AND PHYSICAL:  A 70 year old Caucasian male with past medical history of COPD, diabetes, dyslipidemia, hypertension, osteoarthritis, peripheral neuropathy, history of chronic hyponatremia, mood disorder not otherwise specified.  History of suicidal attempts in the past was admitted to [facility] due to altered mental status.  The patient had history alcohol abuse and dependence.  He had monos Xanax prescription 90 pills filled on May 04, 2010.  The patient only had 12 pills remaining.  The patient came in to the hospital.  The patient was intubated for airway protection.  HOSPITAL COURSE:  1. Status post ventilator-dependent respiratory failure. The patient is intubated for airway protection.  He  was intubated for a repression secondary to benzos overdose.  The patient was then extubated without difficulty.  The patient was briefly on Precedex drip and the patient was transferred to the hospital service.  2.   Suicide attempt.  The patient carries a diagnosis of mood disorder, not      otherwise specified.  The patient is no longer suicidal.  He was      declared no longer suicidal by Dr. Rogers Blocker on May 09, 2009.  The      patient was agreeable to go to [facility] and the plan to send the patient to     [facility] further care.  3. Alcohol dependence.   The patient is no longer has any withdrawal    symptoms from alcohol right now.  4. History of aggressive behavior.  The patient has a history aggressive    behavior.  However, I have not observed during aggressive behavior the    patient in the 2 days and 2 days saw him on the on January 25 and    January 26.   5. Chronic obstructive pulmonary disease, chronic this is a     presumptive diagnosis. It is unclear, the patient has ever had     PFTs.  The patient's chest x-ray does not show hyperinflation and     does not look whether the typical COPD.  Patient takes Advair at     home.  The patient should have a follow-up with a pulmonologist,     have outpatient PFTs to establish a diagnosis of chronic     obstructive pulmonary disease. 6  Diabetes.  The patient has a history of diabetes mellitus.  The     patient's hemoglobin A1c was 7.7.  The patient takes oral     hypoglycemics at home and we will continue that when the patient     leaves the internal medicine service. 7  Chronic pain.  The patient history of chronic pain.  The patient     takes p.r.n. oxycodone. 8  Hypertension.  The patient has history of hypertension and the     patient takes antihypertensives of lisinopril at     home.  We will continue those when the patient leaves the medicine     service. 9   Tobacco abuse.  Patient is tobacco smoker a pack and half a day.     The patient was given a nicotine patch and counseled on tobacco     abuse.     Rock Nephew, MD     NH/MEDQ  D:  05/10/2010  T:  05/10/2010  Job:  [id number]  cc:   Parke Simmers  Eulogio Ditch, MD  Dr. Shan Levans  Electronically Signed by Rock Nephew MD on 05/10/2010 04:37:30 PM

## 2010-05-11 LAB — GLUCOSE, CAPILLARY: Glucose-Capillary: 225 mg/dL — ABNORMAL HIGH (ref 70–99)

## 2010-05-14 NOTE — H&P (Signed)
NAME:  Ernest [name], Ernest [name] NO.:  [id number]  MEDICAL RECORD NO.:  [id number]          PATIENT TYPE:  IPS  LOCATION:  0401                          FACILITY:  [facility]  PHYSICIAN:  Eulogio Ditch, MD DATE OF BIRTH:  [birth year]  DATE OF ADMISSION:  05/10/2010 DATE OF DISCHARGE:                      PSYCHIATRIC ADMISSION ASSESSMENT   This is on a 70 year old male who was admitted on May 10, 2010.  HISTORY OF PRESENT ILLNESS:  The patient was seen in conjunction with Dr. Rogers Blocker.  Patient is here after transfer from the medical unit after the patient had overdosed on benzodiazepines and requiring intubation due to acute respiratory failure.  It is reported that the patient had taken approximately a bottle of Xanax.  The patient states he did relapse on alcohol.  The patient states he has "no intention" to kill himself.  He states that he is having problems with his dexterity and had dropped the pills and did not actually take as many as is stated.  He again denies any suicidal thoughts and is anxious to go home.  PAST PSYCHIATRIC HISTORY:  The patient has had several admissions to [facility] for alcohol use.  No current outpatient mental health therapy.  SOCIAL HISTORY:  The patient lives alone, single.  FAMILY HISTORY:  None.  ALCOHOL/DRUG HISTORY:  Again, a history of alcohol use.  Denies any other substance use.  PRIMARY CARE PROVIDER:  Dr. Parke Simmers in [place], [place].  MEDICAL PROBLEMS:  A history of back surgery, diabetes, hypertension and hyperlipidemia.  MEDICATIONS:  The patient was continued on his transfer medications which include:  1. Multivitamin 1 tablet daily. 2. Nicotine patch daily. 3. Oxycodone 5 mg q.4 h p.r.n. for pain. 4. Advair 250/50 2 puffs b.i.d. 5. Aleve 250 one tablets q.8 h p.r.n. 6. Cymbalta 60 mg once daily. 7. Folic acid 1 mg daily. 8. Gabapentin 300 mg 2 capsules by mouth daily. 9.  Glipizide 5 mg daily. 10.Lisinopril 10 mg daily. 11.Magnesium oxide 400 mg daily. 12.Metformin 500 mg 1 tablet b.i.d. 13.Omega 3 one tablet daily. 14.Protonix 40 mg daily. 15.Trazodone 100 mg at bedtime p.r.n. sleep. 16.Vitamin B1 daily.  MENTAL STATUS EXAM:  The patient is fully alert, disheveled, has an open shirt.  He has a wet stain on his trousers.  States he had spilled coffee earlier.  He has good eye contact.  His speech is loud.  He is very angry.  He states he has been disturbed over lies and the treatment that he has received.  States they had taken away his radio and he has to wear only certain clothing.  His thought processes are coherent, goal directed.  He promises safety.  Does not appear psychotic.  We also met with Dr. Electa Sniff and he felt the patient could be discharged.  His cognitive functioning is intact.  He appears well aware of himself and situation and place.  Insight is poor as to alcohol use.  AXIS I:  Alcohol dependence.  Mood disorder. AXIS II:  Deferred.AXIS III:  History of hypertension, hyperlipidemia, diabetes and back pain. AXIS IV:  Medical problems.  Other psychosocial problems related to chronic  substance use. AXIS V:  Current is 45.  PLAN:  Our plan is to continue patient's transfer medications, clarify his living situation.  The patient is to follow up with his primary providers as needed and to be abstinent from alcohol.  Tentative length of stay is 1-2 days.     Landry Corporal, N.P.   ______________________________ Eulogio Ditch, MD    JO/MEDQ  D:  05/11/2010  T:  05/11/2010  Job:  [id number]  Electronically Signed by Limmie PatriciaP. on 05/14/2010 03:43:30 PM Electronically Signed by Eulogio Ditch  on 05/14/2010 06:22:50 PM

## 2010-05-15 ENCOUNTER — Emergency Department (HOSPITAL_COMMUNITY)
Admission: EM | Admit: 2010-05-15 | Discharge: 2010-05-15 | Payer: Self-pay | Source: Home / Self Care | Admitting: Emergency Medicine

## 2010-05-15 LAB — ETHANOL: Alcohol, Ethyl (B): 130 mg/dL — ABNORMAL HIGH (ref 0–10)

## 2010-05-15 LAB — CBC
Hemoglobin: 13.3 g/dL (ref 13.0–17.0)
MCH: 29.2 pg (ref 26.0–34.0)
MCHC: 33.9 g/dL (ref 30.0–36.0)
Platelets: 320 10*3/uL (ref 150–400)
RBC: 4.56 MIL/uL (ref 4.22–5.81)

## 2010-05-15 LAB — BASIC METABOLIC PANEL
BUN: 7 mg/dL (ref 6–23)
Calcium: 9.3 mg/dL (ref 8.4–10.5)
Chloride: 97 mEq/L (ref 96–112)
Creatinine, Ser: 0.87 mg/dL (ref 0.4–1.5)
GFR calc Af Amer: >60 mL/min (ref >60)
GFR calc non Af Amer: >60 mL/min (ref >60)

## 2010-06-06 NOTE — Discharge Summary (Signed)
NAME:  Ernest [name], Ernest [name] NO.:  [id number]  MEDICAL RECORD NO.:  [id number]           PATIENT TYPE:  LOCATION:                                 FACILITY:  PHYSICIAN:  Eulogio Ditch, MD DATE OF BIRTH:  [birth year]  DATE OF ADMISSION: DATE OF DISCHARGE:                              DISCHARGE SUMMARY   IDENTIFYING INFORMATION:  This is a 70 year old male that was admitted on May 10, 2010.  REASON FOR HOSPITALIZATION:  This is a patient that has had multiple admissions to [facility] for alcohol abuse. Pt was a transfer from the medical unit after the patient had overdosed on benzodiazepines which required intubation due to acute respiratory failure.  The patient had also taken approximately a bottle of Xanax although the patient states that he had no intention to kill himself.  He states that he was having some problems with his dexterity and had actually dropped the pills and did not take as many as it is stated.  He denied any suicidal thoughts.  Significant laboratory data: Hemoglobin A1c of 7.5, sodium of 126, hemoglobin 12.4, 37.4 hematocrit, alcohol level was 6, glucose of 173.  FINAL DIAGNOSES:  AXIS I:  Mood disorder NOS, alcohol dependence. AXIS II:  Deferred. AXIS III:  Back pain, diabetes, peripheral neuropathy, hypertension, diabetes, chronic obstructive pulmonary disease, hyperlipidemia. AXIS IV:  Medical problems, possible other psychosocial problems. AXIS V: Current is 60.  SIGNIFICANT FINDINGS:  This is a fully alert, disheveled male with good eye contact and loud speech but clear and not pressured.  Did not all appear psychotic and he was  coherent and goal-directed was well aware of himself, situation and place.  Judgment was fair.  Insight does seem to be poor as to his use of alcohol and a history of relapsing.  CONDITION OF THE PATIENT ON DISCHARGE:  The patient again was fully alert, disheveled. Was seen in conjunction with  Dr. Rogers Blocker and Dr. Electa Sniff.  We felt the patient was stable enough to be discharge. He was denying any suicidal thoughts.  He had a plan for safety.  The patient was to follow up with his primary care provider.  Again, he became more agreeable when he was known to be discharged and he was agreeable to be abstinent from alcohol.  Instructions given.  The patient was discharged on the following medications:  A multivitamin 1 daily, oxycodone 5 mg q.4 hours p.r.n. for pain, Advair 250/50 two puffs b.i.d., Aleve 250 one q.8 hours p.r.n. for pain, Cymbalta 60 mg daily, folic acid 1 mg daily, gabapentin 300 mg 2 capsules by mouth daily, glipizide 5 mg daily, lisinopril 10 mg daily, magnesium oxide 400 mg daily, metformin 500 mg 1 tablet b.i.d., Omega-3 one tablet daily, Protonix 40 mg daily, trazodone 100 mg q.h.s. p.r.n. for sleep, vitamin B one daily.  The patient was also to follow up with his primary care providers as needed, to remain abstinent from alcohol, to return for any thoughts of suicidal or homicidal ideation.     Landry Corporal, N.P.   ______________________________ Eulogio Ditch, MD    JO/MEDQ  D:  05/30/2010  T:  05/30/2010  Job:  [id number]  Electronically Signed by Limmie Patricia.P. on 06/04/2010 09:30:46 AM Electronically Signed by Eulogio Ditch  on 06/06/2010 04:47:22 AM

## 2010-06-14 ENCOUNTER — Emergency Department (HOSPITAL_COMMUNITY)
Admission: EM | Admit: 2010-06-14 | Discharge: 2010-06-15 | Discharge status: Against Medical Advice | Payer: Medicare Other | Attending: Emergency Medicine | Admitting: Emergency Medicine

## 2010-06-14 DIAGNOSIS — M549 Dorsalgia, unspecified: Secondary | ICD-10-CM | POA: Insufficient documentation

## 2010-06-19 ENCOUNTER — Emergency Department (HOSPITAL_COMMUNITY)
Admission: EM | Admit: 2010-06-19 | Discharge: 2010-06-21 | Disposition: A | Discharge status: Other Acute Inpatient Hospital | Payer: Medicare Other | Attending: Emergency Medicine | Admitting: Emergency Medicine

## 2010-06-19 DIAGNOSIS — R45851 Suicidal ideations: Secondary | ICD-10-CM | POA: Insufficient documentation

## 2010-06-19 DIAGNOSIS — Z5900 Homelessness unspecified: Secondary | ICD-10-CM | POA: Insufficient documentation

## 2010-06-19 DIAGNOSIS — I10 Essential (primary) hypertension: Secondary | ICD-10-CM | POA: Insufficient documentation

## 2010-06-19 DIAGNOSIS — E7800 Pure hypercholesterolemia, unspecified: Secondary | ICD-10-CM | POA: Insufficient documentation

## 2010-06-19 DIAGNOSIS — F3289 Other specified depressive episodes: Secondary | ICD-10-CM | POA: Insufficient documentation

## 2010-06-19 DIAGNOSIS — M1990 Unspecified osteoarthritis, unspecified site: Secondary | ICD-10-CM | POA: Insufficient documentation

## 2010-06-19 DIAGNOSIS — Z79899 Other long term (current) drug therapy: Secondary | ICD-10-CM | POA: Insufficient documentation

## 2010-06-19 DIAGNOSIS — F329 Major depressive disorder, single episode, unspecified: Secondary | ICD-10-CM | POA: Insufficient documentation

## 2010-06-19 DIAGNOSIS — E119 Type 2 diabetes mellitus without complications: Secondary | ICD-10-CM | POA: Insufficient documentation

## 2010-06-19 LAB — DIFFERENTIAL
Basophils Absolute: 0 10*3/uL (ref 0.0–0.1)
Basophils Relative: 0 % (ref 0–1)
Lymphocytes Relative: 23 % (ref 12–46)
Monocytes Absolute: 1 10*3/uL (ref 0.1–1.0)
Neutro Abs: 8.6 10*3/uL — ABNORMAL HIGH (ref 1.7–7.7)
Neutrophils Relative %: 68 % (ref 43–77)

## 2010-06-19 LAB — CBC
HCT: 38.8 % — ABNORMAL LOW (ref 39.0–52.0)
Hemoglobin: 13.3 g/dL (ref 13.0–17.0)
MCHC: 34.3 g/dL (ref 30.0–36.0)
RBC: 4.48 MIL/uL (ref 4.22–5.81)
WBC: 12.6 10*3/uL — ABNORMAL HIGH (ref 4.0–10.5)

## 2010-06-19 LAB — POCT I-STAT, CHEM 8
HCT: 44 % (ref 39.0–52.0)
Hemoglobin: 15 g/dL (ref 13.0–17.0)
Potassium: 4.2 mEq/L (ref 3.5–5.1)
Sodium: 133 mEq/L — ABNORMAL LOW (ref 135–145)
TCO2: 25 mmol/L (ref 0–100)

## 2010-06-19 LAB — ETHANOL: Alcohol, Ethyl (B): <5 mg/dL (ref 0–10)

## 2010-06-20 DIAGNOSIS — F1910 Other psychoactive substance abuse, uncomplicated: Secondary | ICD-10-CM

## 2010-06-20 LAB — RAPID URINE DRUG SCREEN, HOSP PERFORMED
Amphetamines: NOT DETECTED
Barbiturates: NOT DETECTED
Benzodiazepines: NOT DETECTED
Opiates: NOT DETECTED

## 2010-06-20 LAB — GLUCOSE, CAPILLARY: Glucose-Capillary: 268 mg/dL — ABNORMAL HIGH (ref 70–99)

## 2010-06-21 LAB — GLUCOSE, CAPILLARY: Glucose-Capillary: 288 mg/dL — ABNORMAL HIGH (ref 70–99)

## 2010-06-26 LAB — BASIC METABOLIC PANEL
CO2: 18 mEq/L — ABNORMAL LOW (ref 19–32)
CO2: 24 mEq/L (ref 19–32)
CO2: 24 mEq/L (ref 19–32)
Calcium: 8.9 mg/dL (ref 8.4–10.5)
Calcium: 9.6 mg/dL (ref 8.4–10.5)
Chloride: 92 mEq/L — ABNORMAL LOW (ref 96–112)
Chloride: 92 mEq/L — ABNORMAL LOW (ref 96–112)
Chloride: 98 mEq/L (ref 96–112)
Creatinine, Ser: 0.66 mg/dL (ref 0.4–1.5)
Creatinine, Ser: 0.7 mg/dL (ref 0.4–1.5)
Creatinine, Ser: 1.15 mg/dL (ref 0.4–1.5)
GFR calc Af Amer: >60 mL/min (ref >60)
GFR calc Af Amer: >60 mL/min (ref >60)
GFR calc Af Amer: >60 mL/min (ref >60)
GFR calc Af Amer: >60 mL/min (ref >60)
GFR calc non Af Amer: >60 mL/min (ref >60)
Glucose, Bld: 158 mg/dL — ABNORMAL HIGH (ref 70–99)
Potassium: 4.4 mEq/L (ref 3.5–5.1)
Potassium: 4.5 mEq/L (ref 3.5–5.1)
Sodium: 124 mEq/L — ABNORMAL LOW (ref 135–145)
Sodium: 129 mEq/L — ABNORMAL LOW (ref 135–145)

## 2010-06-26 LAB — COMPREHENSIVE METABOLIC PANEL
ALT: 20 U/L (ref 0–53)
Albumin: 3.6 g/dL (ref 3.5–5.2)
Albumin: 3.7 g/dL (ref 3.5–5.2)
BUN: 5 mg/dL — ABNORMAL LOW (ref 6–23)
Calcium: 9.4 mg/dL (ref 8.4–10.5)
Chloride: 90 mEq/L — ABNORMAL LOW (ref 96–112)
Creatinine, Ser: 0.67 mg/dL (ref 0.4–1.5)
GFR calc Af Amer: >60 mL/min (ref >60)
GFR calc non Af Amer: >60 mL/min (ref >60)
Glucose, Bld: 193 mg/dL — ABNORMAL HIGH (ref 70–99)
Potassium: 4.6 mEq/L (ref 3.5–5.1)
Sodium: 128 mEq/L — ABNORMAL LOW (ref 135–145)
Total Bilirubin: 0.6 mg/dL (ref 0.3–1.2)
Total Protein: 6.7 g/dL (ref 6.0–8.3)

## 2010-06-26 LAB — DIFFERENTIAL
Basophils Absolute: 0 10*3/uL (ref 0.0–0.1)
Basophils Absolute: 0.1 10*3/uL (ref 0.0–0.1)
Basophils Relative: 1 % (ref 0–1)
Eosinophils Absolute: 0.1 10*3/uL (ref 0.0–0.7)
Lymphocytes Relative: 24 % (ref 12–46)
Monocytes Absolute: 0.5 10*3/uL (ref 0.1–1.0)
Neutro Abs: 4.6 10*3/uL (ref 1.7–7.7)
Neutrophils Relative %: 56 % (ref 43–77)

## 2010-06-26 LAB — PROTIME-INR
INR: 0.98 (ref 0.00–1.49)
INR: 1 (ref 0.00–1.49)
Prothrombin Time: 13.2 seconds (ref 11.6–15.2)
Prothrombin Time: 13.4 seconds (ref 11.6–15.2)

## 2010-06-26 LAB — CBC
HCT: 40 % (ref 39.0–52.0)
MCH: 27.3 pg (ref 26.0–34.0)
MCH: 27.5 pg (ref 26.0–34.0)
MCHC: 32.8 g/dL (ref 30.0–36.0)
MCHC: 33.1 g/dL (ref 30.0–36.0)
MCHC: 33.2 g/dL (ref 30.0–36.0)
MCV: 82.7 fL (ref 78.0–100.0)
Platelets: 344 10*3/uL (ref 150–400)
Platelets: 349 10*3/uL (ref 150–400)
Platelets: 361 10*3/uL (ref 150–400)
RBC: 5.01 MIL/uL (ref 4.22–5.81)
RDW: 16.3 % — ABNORMAL HIGH (ref 11.5–15.5)
WBC: 8.6 10*3/uL (ref 4.0–10.5)

## 2010-06-26 LAB — RAPID URINE DRUG SCREEN, HOSP PERFORMED
Cocaine: NOT DETECTED
Tetrahydrocannabinol: NOT DETECTED

## 2010-06-26 LAB — MAGNESIUM: Magnesium: 1.8 mg/dL (ref 1.5–2.5)

## 2010-06-26 LAB — GLUCOSE, CAPILLARY
Glucose-Capillary: 137 mg/dL — ABNORMAL HIGH (ref 70–99)
Glucose-Capillary: 155 mg/dL — ABNORMAL HIGH (ref 70–99)
Glucose-Capillary: 174 mg/dL — ABNORMAL HIGH (ref 70–99)
Glucose-Capillary: 184 mg/dL — ABNORMAL HIGH (ref 70–99)
Glucose-Capillary: 189 mg/dL — ABNORMAL HIGH (ref 70–99)
Glucose-Capillary: 204 mg/dL — ABNORMAL HIGH (ref 70–99)
Glucose-Capillary: 209 mg/dL — ABNORMAL HIGH (ref 70–99)
Glucose-Capillary: 222 mg/dL — ABNORMAL HIGH (ref 70–99)
Glucose-Capillary: 230 mg/dL — ABNORMAL HIGH (ref 70–99)

## 2010-06-26 LAB — LIPID PANEL
HDL: 35 mg/dL — ABNORMAL LOW (ref >39)
Total CHOL/HDL Ratio: 4.7 RATIO
Triglycerides: 181 mg/dL — ABNORMAL HIGH (ref <150)
VLDL: 36 mg/dL (ref 0–40)

## 2010-06-26 LAB — HEPATIC FUNCTION PANEL
Albumin: 3.8 g/dL (ref 3.5–5.2)
Total Protein: 7.3 g/dL (ref 6.0–8.3)

## 2010-06-26 LAB — URINALYSIS, ROUTINE W REFLEX MICROSCOPIC
Ketones, ur: NEGATIVE mg/dL
Leukocytes, UA: NEGATIVE
Nitrite: NEGATIVE
Protein, ur: NEGATIVE mg/dL
pH: 6 (ref 5.0–8.0)

## 2010-06-26 LAB — HEMOGLOBIN A1C: Hgb A1c MFr Bld: 7.2 % — ABNORMAL HIGH (ref <5.7)

## 2010-06-26 LAB — OSMOLALITY, URINE: Osmolality, Ur: 410 mOsm/kg (ref 390–1090)

## 2010-06-26 LAB — ACETAMINOPHEN LEVEL: Acetaminophen (Tylenol), Serum: <10 ug/mL — ABNORMAL LOW (ref 10–30)

## 2010-06-26 LAB — URINE MICROSCOPIC-ADD ON: Urine-Other: NONE SEEN

## 2010-06-26 LAB — OSMOLALITY: Osmolality: 278 mOsm/kg (ref 275–300)

## 2010-06-26 LAB — PHOSPHORUS: Phosphorus: 2 mg/dL — ABNORMAL LOW (ref 2.3–4.6)

## 2010-07-19 LAB — GLUCOSE, CAPILLARY
Glucose-Capillary: 159 mg/dL — ABNORMAL HIGH (ref 70–99)
Glucose-Capillary: 169 mg/dL — ABNORMAL HIGH (ref 70–99)
Glucose-Capillary: 98 mg/dL (ref 70–99)
Glucose-Capillary: 188 mg/dL — ABNORMAL HIGH (ref 70–99)

## 2010-07-19 LAB — DIFFERENTIAL
Basophils Relative: 0 % (ref 0–1)
Basophils Relative: 0 % (ref 0–1)
Basophils Relative: 1 % (ref 0–1)
Eosinophils Absolute: 0.1 10*3/uL (ref 0.0–0.7)
Eosinophils Absolute: 0.1 10*3/uL (ref 0.0–0.7)
Eosinophils Absolute: 0 10*3/uL (ref 0.0–0.7)
Eosinophils Relative: 1 % (ref 0–5)
Eosinophils Relative: 1 % (ref 0–5)
Lymphs Abs: 1.4 10*3/uL (ref 0.7–4.0)
Lymphs Abs: 2.8 10*3/uL (ref 0.7–4.0)
Monocytes Absolute: 0.4 10*3/uL (ref 0.1–1.0)
Monocytes Absolute: 0.6 10*3/uL (ref 0.1–1.0)
Monocytes Relative: 4 % (ref 3–12)
Monocytes Relative: 5 % (ref 3–12)
Monocytes Relative: 6 % (ref 3–12)
Neutrophils Relative %: 71 % (ref 43–77)
Neutrophils Relative %: 78 % — ABNORMAL HIGH (ref 43–77)

## 2010-07-19 LAB — BASIC METABOLIC PANEL
BUN: 10 mg/dL (ref 6–23)
BUN: 11 mg/dL (ref 6–23)
CO2: 25 mEq/L (ref 19–32)
CO2: 23 mEq/L (ref 19–32)
Chloride: 100 mEq/L (ref 96–112)
Chloride: 99 mEq/L (ref 96–112)
Chloride: 94 mEq/L — ABNORMAL LOW (ref 96–112)
Creatinine, Ser: 0.83 mg/dL (ref 0.4–1.5)
GFR calc Af Amer: >60 mL/min (ref >60)
GFR calc Af Amer: >60 mL/min (ref >60)
GFR calc non Af Amer: >60 mL/min (ref >60)
Glucose, Bld: 145 mg/dL — ABNORMAL HIGH (ref 70–99)
Potassium: 4.2 mEq/L (ref 3.5–5.1)
Potassium: 4 mEq/L (ref 3.5–5.1)
Sodium: 133 mEq/L — ABNORMAL LOW (ref 135–145)

## 2010-07-19 LAB — COMPREHENSIVE METABOLIC PANEL
ALT: 21 U/L (ref 0–53)
AST: 24 U/L (ref 0–37)
Albumin: 4.2 g/dL (ref 3.5–5.2)
Alkaline Phosphatase: 59 U/L (ref 39–117)
Calcium: 9.2 mg/dL (ref 8.4–10.5)
GFR calc Af Amer: >60 mL/min (ref >60)
Glucose, Bld: 144 mg/dL — ABNORMAL HIGH (ref 70–99)
Potassium: 4.2 mEq/L (ref 3.5–5.1)
Sodium: 129 mEq/L — ABNORMAL LOW (ref 135–145)
Total Protein: 7.3 g/dL (ref 6.0–8.3)

## 2010-07-19 LAB — CBC
HCT: 45 % (ref 39.0–52.0)
Hemoglobin: 14.2 g/dL (ref 13.0–17.0)
MCHC: 34.6 g/dL (ref 30.0–36.0)
MCHC: 33.3 g/dL (ref 30.0–36.0)
MCV: 89.3 fL (ref 78.0–100.0)
MCV: 89.7 fL (ref 78.0–100.0)
Platelets: 262 10*3/uL (ref 150–400)
RBC: 5.02 MIL/uL (ref 4.22–5.81)
RDW: 18.1 % — ABNORMAL HIGH (ref 11.5–15.5)
RDW: 18.2 % — ABNORMAL HIGH (ref 11.5–15.5)
WBC: 9.7 10*3/uL (ref 4.0–10.5)

## 2010-07-19 LAB — MAGNESIUM: Magnesium: 2.5 mg/dL (ref 1.5–2.5)

## 2010-07-19 LAB — URINALYSIS, ROUTINE W REFLEX MICROSCOPIC
Bilirubin Urine: NEGATIVE
Glucose, UA: >1000 mg/dL — AB
Ketones, ur: NEGATIVE mg/dL
Leukocytes, UA: NEGATIVE
Nitrite: NEGATIVE
Protein, ur: NEGATIVE mg/dL
pH: 6 (ref 5.0–8.0)

## 2010-07-19 LAB — RAPID URINE DRUG SCREEN, HOSP PERFORMED
Amphetamines: NOT DETECTED
Amphetamines: NOT DETECTED
Barbiturates: NOT DETECTED
Opiates: NOT DETECTED
Tetrahydrocannabinol: NOT DETECTED
Tetrahydrocannabinol: NOT DETECTED
Tetrahydrocannabinol: NOT DETECTED

## 2010-07-19 LAB — FOLATE: Folate: >20 ng/mL

## 2010-07-19 LAB — ETHANOL
Alcohol, Ethyl (B): 131 mg/dL — ABNORMAL HIGH (ref 0–10)
Alcohol, Ethyl (B): 221 mg/dL — ABNORMAL HIGH (ref 0–10)

## 2010-07-19 LAB — VITAMIN B1: Vitamin B1 (Thiamine): 23 nmol/L (ref 9–44)

## 2010-07-20 LAB — GLUCOSE, CAPILLARY
Glucose-Capillary: 153 mg/dL — ABNORMAL HIGH (ref 70–99)
Glucose-Capillary: 170 mg/dL — ABNORMAL HIGH (ref 70–99)
Glucose-Capillary: 181 mg/dL — ABNORMAL HIGH (ref 70–99)
Glucose-Capillary: 74 mg/dL (ref 70–99)
Glucose-Capillary: 133 mg/dL — ABNORMAL HIGH (ref 70–99)
Glucose-Capillary: 143 mg/dL — ABNORMAL HIGH (ref 70–99)
Glucose-Capillary: 151 mg/dL — ABNORMAL HIGH (ref 70–99)
Glucose-Capillary: 156 mg/dL — ABNORMAL HIGH (ref 70–99)
Glucose-Capillary: 163 mg/dL — ABNORMAL HIGH (ref 70–99)
Glucose-Capillary: 188 mg/dL — ABNORMAL HIGH (ref 70–99)
Glucose-Capillary: 228 mg/dL — ABNORMAL HIGH (ref 70–99)
Glucose-Capillary: 78 mg/dL (ref 70–99)
Glucose-Capillary: 92 mg/dL (ref 70–99)
Glucose-Capillary: 129 mg/dL — ABNORMAL HIGH (ref 70–99)
Glucose-Capillary: 136 mg/dL — ABNORMAL HIGH (ref 70–99)
Glucose-Capillary: 158 mg/dL — ABNORMAL HIGH (ref 70–99)
Glucose-Capillary: 161 mg/dL — ABNORMAL HIGH (ref 70–99)
Glucose-Capillary: 165 mg/dL — ABNORMAL HIGH (ref 70–99)
Glucose-Capillary: 169 mg/dL — ABNORMAL HIGH (ref 70–99)
Glucose-Capillary: 197 mg/dL — ABNORMAL HIGH (ref 70–99)
Glucose-Capillary: 79 mg/dL (ref 70–99)
Glucose-Capillary: 149 mg/dL — ABNORMAL HIGH (ref 70–99)
Glucose-Capillary: 175 mg/dL — ABNORMAL HIGH (ref 70–99)
Glucose-Capillary: 182 mg/dL — ABNORMAL HIGH (ref 70–99)
Glucose-Capillary: 216 mg/dL — ABNORMAL HIGH (ref 70–99)

## 2010-07-20 LAB — URINALYSIS, ROUTINE W REFLEX MICROSCOPIC
Bilirubin Urine: NEGATIVE
Glucose, UA: 500 mg/dL — AB
Glucose, UA: NEGATIVE mg/dL
Hgb urine dipstick: NEGATIVE
Ketones, ur: NEGATIVE mg/dL
Nitrite: NEGATIVE
Nitrite: NEGATIVE
Protein, ur: NEGATIVE mg/dL
Protein, ur: NEGATIVE mg/dL
Specific Gravity, Urine: 1.01 (ref 1.005–1.030)
Specific Gravity, Urine: 1.009 (ref 1.005–1.030)
Urobilinogen, UA: 0.2 mg/dL (ref 0.0–1.0)
Urobilinogen, UA: 0.2 mg/dL (ref 0.0–1.0)
pH: 5.5 (ref 5.0–8.0)

## 2010-07-20 LAB — NA AND K (SODIUM & POTASSIUM), RAND UR
Potassium Urine: 5 mEq/L
Sodium, Ur: 35 mEq/L

## 2010-07-20 LAB — COMPREHENSIVE METABOLIC PANEL
ALT: 22 U/L (ref 0–53)
ALT: 23 U/L (ref 0–53)
AST: 38 U/L — ABNORMAL HIGH (ref 0–37)
Albumin: 4.2 g/dL (ref 3.5–5.2)
Alkaline Phosphatase: 56 U/L (ref 39–117)
BUN: 10 mg/dL (ref 6–23)
BUN: 10 mg/dL (ref 6–23)
CO2: 20 mEq/L (ref 19–32)
CO2: 23 mEq/L (ref 19–32)
Calcium: 9.1 mg/dL (ref 8.4–10.5)
Calcium: 9.2 mg/dL (ref 8.4–10.5)
Chloride: 101 mEq/L (ref 96–112)
Creatinine, Ser: 0.74 mg/dL (ref 0.4–1.5)
Creatinine, Ser: 0.62 mg/dL (ref 0.4–1.5)
Creatinine, Ser: 0.78 mg/dL (ref 0.4–1.5)
GFR calc Af Amer: >60 mL/min (ref >60)
GFR calc non Af Amer: >60 mL/min (ref >60)
GFR calc non Af Amer: >60 mL/min (ref >60)
Glucose, Bld: 102 mg/dL — ABNORMAL HIGH (ref 70–99)
Glucose, Bld: 79 mg/dL (ref 70–99)
Potassium: 4 mEq/L (ref 3.5–5.1)
Sodium: 120 mEq/L — ABNORMAL LOW (ref 135–145)
Total Bilirubin: 0.5 mg/dL (ref 0.3–1.2)
Total Protein: 7.3 g/dL (ref 6.0–8.3)

## 2010-07-20 LAB — BASIC METABOLIC PANEL
BUN: 11 mg/dL (ref 6–23)
BUN: 7 mg/dL (ref 6–23)
BUN: 8 mg/dL (ref 6–23)
BUN: 8 mg/dL (ref 6–23)
CO2: 25 mEq/L (ref 19–32)
Calcium: 9.4 mg/dL (ref 8.4–10.5)
Chloride: 93 mEq/L — ABNORMAL LOW (ref 96–112)
Chloride: 97 mEq/L (ref 96–112)
Chloride: 99 mEq/L (ref 96–112)
Creatinine, Ser: 0.69 mg/dL (ref 0.4–1.5)
Creatinine, Ser: 0.79 mg/dL (ref 0.4–1.5)
Creatinine, Ser: 0.93 mg/dL (ref 0.4–1.5)
GFR calc Af Amer: >60 mL/min (ref >60)
GFR calc non Af Amer: >60 mL/min (ref >60)
GFR calc non Af Amer: >60 mL/min (ref >60)
Glucose, Bld: 149 mg/dL — ABNORMAL HIGH (ref 70–99)
Glucose, Bld: 161 mg/dL — ABNORMAL HIGH (ref 70–99)
Potassium: 4.2 mEq/L (ref 3.5–5.1)
Potassium: 4.5 mEq/L (ref 3.5–5.1)

## 2010-07-20 LAB — DIFFERENTIAL
Basophils Absolute: 0.2 10*3/uL — ABNORMAL HIGH (ref 0.0–0.1)
Basophils Relative: 1 % (ref 0–1)
Eosinophils Absolute: 0.2 10*3/uL (ref 0.0–0.7)
Eosinophils Absolute: 0.3 10*3/uL (ref 0.0–0.7)
Eosinophils Relative: 2 % (ref 0–5)
Lymphocytes Relative: 20 % (ref 12–46)
Lymphocytes Relative: 29 % (ref 12–46)
Lymphs Abs: 2.7 10*3/uL (ref 0.7–4.0)
Lymphs Abs: 2.9 10*3/uL (ref 0.7–4.0)
Monocytes Absolute: 0.5 10*3/uL (ref 0.1–1.0)
Monocytes Absolute: 0.9 10*3/uL (ref 0.1–1.0)
Monocytes Relative: 7 % (ref 3–12)
Neutro Abs: 9.1 10*3/uL — ABNORMAL HIGH (ref 1.7–7.7)
Neutro Abs: 6 10*3/uL (ref 1.7–7.7)
Neutrophils Relative %: 71 % (ref 43–77)
Neutrophils Relative %: 61 % (ref 43–77)

## 2010-07-20 LAB — HEMOGLOBIN A1C: Hgb A1c MFr Bld: 7.5 % — ABNORMAL HIGH (ref 4.6–6.1)

## 2010-07-20 LAB — CBC
HCT: 42.4 % (ref 39.0–52.0)
HCT: 40.7 % (ref 39.0–52.0)
HCT: 40.9 % (ref 39.0–52.0)
Hemoglobin: 14.1 g/dL (ref 13.0–17.0)
Hemoglobin: 13.6 g/dL (ref 13.0–17.0)
MCHC: 33.4 g/dL (ref 30.0–36.0)
MCHC: 33.7 g/dL (ref 30.0–36.0)
MCHC: 34 g/dL (ref 30.0–36.0)
MCV: 89.5 fL (ref 78.0–100.0)
MCV: 88.9 fL (ref 78.0–100.0)
MCV: 89 fL (ref 78.0–100.0)
MCV: 89.5 fL (ref 78.0–100.0)
Platelets: 234 10*3/uL (ref 150–400)
Platelets: 294 10*3/uL (ref 150–400)
Platelets: 373 10*3/uL (ref 150–400)
RBC: 4.57 MIL/uL (ref 4.22–5.81)
RBC: 4.89 MIL/uL (ref 4.22–5.81)
RDW: 18.9 % — ABNORMAL HIGH (ref 11.5–15.5)
WBC: 12.9 10*3/uL — ABNORMAL HIGH (ref 4.0–10.5)
WBC: 6.6 10*3/uL (ref 4.0–10.5)

## 2010-07-20 LAB — RAPID URINE DRUG SCREEN, HOSP PERFORMED
Amphetamines: NOT DETECTED
Amphetamines: NOT DETECTED
Barbiturates: NOT DETECTED
Benzodiazepines: NOT DETECTED
Benzodiazepines: NOT DETECTED
Cocaine: NOT DETECTED
Cocaine: NOT DETECTED
Opiates: NOT DETECTED

## 2010-07-20 LAB — URINE CULTURE: Colony Count: NO GROWTH

## 2010-07-20 LAB — OSMOLALITY, URINE: Osmolality, Ur: 156 mOsm/kg — ABNORMAL LOW (ref 390–1090)

## 2010-07-20 LAB — LIPASE, BLOOD: Lipase: 18 U/L (ref 11–59)

## 2010-07-20 LAB — ETHANOL: Alcohol, Ethyl (B): 169 mg/dL — ABNORMAL HIGH (ref 0–10)

## 2010-07-21 LAB — COMPREHENSIVE METABOLIC PANEL
ALT: 24 U/L (ref 0–53)
AST: 24 U/L (ref 0–37)
Albumin: 3.7 g/dL (ref 3.5–5.2)
Albumin: 3.4 g/dL — ABNORMAL LOW (ref 3.5–5.2)
Alkaline Phosphatase: 52 U/L (ref 39–117)
Alkaline Phosphatase: 57 U/L (ref 39–117)
BUN: 10 mg/dL (ref 6–23)
BUN: 8 mg/dL (ref 6–23)
BUN: 5 mg/dL — ABNORMAL LOW (ref 6–23)
Calcium: 9 mg/dL (ref 8.4–10.5)
Chloride: 108 mEq/L (ref 96–112)
Chloride: 96 mEq/L (ref 96–112)
Creatinine, Ser: 0.83 mg/dL (ref 0.4–1.5)
Creatinine, Ser: 0.71 mg/dL (ref 0.4–1.5)
GFR calc Af Amer: >60 mL/min (ref >60)
GFR calc Af Amer: >60 mL/min (ref >60)
GFR calc non Af Amer: >60 mL/min (ref >60)
Glucose, Bld: 126 mg/dL — ABNORMAL HIGH (ref 70–99)
Potassium: 3.7 mEq/L (ref 3.5–5.1)
Potassium: 4.1 mEq/L (ref 3.5–5.1)
Sodium: 132 mEq/L — ABNORMAL LOW (ref 135–145)
Total Bilirubin: 0.5 mg/dL (ref 0.3–1.2)
Total Bilirubin: 0.4 mg/dL (ref 0.3–1.2)
Total Protein: 6.7 g/dL (ref 6.0–8.3)
Total Protein: 6.8 g/dL (ref 6.0–8.3)

## 2010-07-21 LAB — DIFFERENTIAL
Basophils Absolute: 0.1 10*3/uL (ref 0.0–0.1)
Basophils Absolute: 0 10*3/uL (ref 0.0–0.1)
Basophils Absolute: 0.1 10*3/uL (ref 0.0–0.1)
Basophils Relative: 1 % (ref 0–1)
Basophils Relative: 1 % (ref 0–1)
Eosinophils Absolute: 0 10*3/uL (ref 0.0–0.7)
Eosinophils Relative: 1 % (ref 0–5)
Eosinophils Relative: 1 % (ref 0–5)
Lymphocytes Relative: 26 % (ref 12–46)
Lymphs Abs: 1.7 10*3/uL (ref 0.7–4.0)
Monocytes Absolute: 0.7 10*3/uL (ref 0.1–1.0)
Neutro Abs: 5.1 10*3/uL (ref 1.7–7.7)
Neutro Abs: 7 10*3/uL (ref 1.7–7.7)
Neutrophils Relative %: 64 % (ref 43–77)
Neutrophils Relative %: 70 % (ref 43–77)

## 2010-07-21 LAB — CBC
HCT: 39.2 % (ref 39.0–52.0)
HCT: 38.9 % — ABNORMAL LOW (ref 39.0–52.0)
HCT: 40.6 % (ref 39.0–52.0)
HCT: 42.9 % (ref 39.0–52.0)
Hemoglobin: 13.2 g/dL (ref 13.0–17.0)
Hemoglobin: 14.1 g/dL (ref 13.0–17.0)
MCHC: 32.9 g/dL (ref 30.0–36.0)
MCHC: 33.5 g/dL (ref 30.0–36.0)
MCHC: 33.6 g/dL (ref 30.0–36.0)
MCV: 89.5 fL (ref 78.0–100.0)
MCV: 89.7 fL (ref 78.0–100.0)
MCV: 90.2 fL (ref 78.0–100.0)
Platelets: 289 10*3/uL (ref 150–400)
Platelets: 264 10*3/uL (ref 150–400)
Platelets: 307 10*3/uL (ref 150–400)
RBC: 4.34 MIL/uL (ref 4.22–5.81)
RDW: 18.6 % — ABNORMAL HIGH (ref 11.5–15.5)
RDW: 18.8 % — ABNORMAL HIGH (ref 11.5–15.5)
RDW: 17.2 % — ABNORMAL HIGH (ref 11.5–15.5)
RDW: 18.9 % — ABNORMAL HIGH (ref 11.5–15.5)
WBC: 8 10*3/uL (ref 4.0–10.5)

## 2010-07-21 LAB — GLUCOSE, CAPILLARY
Glucose-Capillary: 116 mg/dL — ABNORMAL HIGH (ref 70–99)
Glucose-Capillary: 130 mg/dL — ABNORMAL HIGH (ref 70–99)
Glucose-Capillary: 150 mg/dL — ABNORMAL HIGH (ref 70–99)
Glucose-Capillary: 164 mg/dL — ABNORMAL HIGH (ref 70–99)
Glucose-Capillary: 145 mg/dL — ABNORMAL HIGH (ref 70–99)
Glucose-Capillary: 161 mg/dL — ABNORMAL HIGH (ref 70–99)
Glucose-Capillary: 162 mg/dL — ABNORMAL HIGH (ref 70–99)
Glucose-Capillary: 166 mg/dL — ABNORMAL HIGH (ref 70–99)
Glucose-Capillary: 182 mg/dL — ABNORMAL HIGH (ref 70–99)
Glucose-Capillary: 225 mg/dL — ABNORMAL HIGH (ref 70–99)
Glucose-Capillary: 104 mg/dL — ABNORMAL HIGH (ref 70–99)
Glucose-Capillary: 146 mg/dL — ABNORMAL HIGH (ref 70–99)
Glucose-Capillary: 168 mg/dL — ABNORMAL HIGH (ref 70–99)
Glucose-Capillary: 92 mg/dL (ref 70–99)
Glucose-Capillary: 96 mg/dL (ref 70–99)
Glucose-Capillary: 107 mg/dL — ABNORMAL HIGH (ref 70–99)
Glucose-Capillary: 122 mg/dL — ABNORMAL HIGH (ref 70–99)
Glucose-Capillary: 166 mg/dL — ABNORMAL HIGH (ref 70–99)

## 2010-07-21 LAB — POCT I-STAT, CHEM 8
Calcium, Ion: 1.11 mmol/L — ABNORMAL LOW (ref 1.12–1.32)
Chloride: 90 mEq/L — ABNORMAL LOW (ref 96–112)
Glucose, Bld: 120 mg/dL — ABNORMAL HIGH (ref 70–99)
HCT: 42 % (ref 39.0–52.0)
Hemoglobin: 14.3 g/dL (ref 13.0–17.0)
TCO2: 17 mmol/L (ref 0–100)

## 2010-07-21 LAB — LIPID PANEL
HDL: 41 mg/dL (ref >39)
Triglycerides: 96 mg/dL (ref <150)

## 2010-07-21 LAB — OSMOLALITY
Osmolality: 276 mOsm/kg (ref 275–300)
Osmolality: 280 mOsm/kg (ref 275–300)

## 2010-07-21 LAB — HEPATIC FUNCTION PANEL
Alkaline Phosphatase: 52 U/L (ref 39–117)
Bilirubin, Direct: <0.1 mg/dL (ref 0.0–0.3)
Total Bilirubin: 0.5 mg/dL (ref 0.3–1.2)

## 2010-07-21 LAB — RAPID URINE DRUG SCREEN, HOSP PERFORMED
Benzodiazepines: NOT DETECTED
Cocaine: NOT DETECTED
Tetrahydrocannabinol: NOT DETECTED

## 2010-07-21 LAB — TRICYCLICS SCREEN, URINE: TCA Scrn: NOT DETECTED

## 2010-07-21 LAB — HEMOGLOBIN A1C: Mean Plasma Glucose: 151 mg/dL

## 2010-07-21 LAB — CREATININE, URINE, RANDOM: Creatinine, Urine: 27.7 mg/dL

## 2010-07-21 LAB — ETHANOL
Alcohol, Ethyl (B): 165 mg/dL — ABNORMAL HIGH (ref 0–10)
Alcohol, Ethyl (B): <5 mg/dL (ref 0–10)

## 2010-07-21 LAB — BASIC METABOLIC PANEL
CO2: 21 mEq/L (ref 19–32)
Calcium: 9.5 mg/dL (ref 8.4–10.5)
Glucose, Bld: 134 mg/dL — ABNORMAL HIGH (ref 70–99)
Sodium: 127 mEq/L — ABNORMAL LOW (ref 135–145)

## 2010-07-21 LAB — TSH
TSH: 4.114 u[IU]/mL (ref 0.350–4.500)
TSH: 1.084 u[IU]/mL (ref 0.350–4.500)

## 2010-07-21 LAB — SALICYLATE LEVEL: Salicylate Lvl: <4 mg/dL (ref 2.8–20.0)

## 2010-07-31 LAB — PROTIME-INR
INR: 0.9 (ref 0.00–1.49)
Prothrombin Time: 12.3 seconds (ref 11.6–15.2)

## 2010-07-31 LAB — CBC
MCHC: 33.6 g/dL (ref 30.0–36.0)
RDW: 13.5 % (ref 11.5–15.5)

## 2010-07-31 LAB — ETHANOL: Alcohol, Ethyl (B): 239 mg/dL — ABNORMAL HIGH (ref 0–10)

## 2010-08-19 ENCOUNTER — Emergency Department: Payer: Self-pay | Admitting: Unknown Physician Specialty

## 2010-08-20 ENCOUNTER — Emergency Department: Payer: Self-pay | Admitting: Emergency Medicine

## 2010-08-22 ENCOUNTER — Inpatient Hospital Stay: Payer: Self-pay | Admitting: Psychiatry

## 2010-08-28 NOTE — Discharge Summary (Signed)
NAME:  Ernest [name], Ernest [name] NO.:  [id number]   MEDICAL RECORD NO.:  [id number]          PATIENT TYPE:  IPS   LOCATION:  0405                          FACILITY:  [facility]   PHYSICIAN:  Geoffery Lyons, M.D.      DATE OF BIRTH:  1940/08/06   DATE OF ADMISSION:  09/23/2007  DATE OF DISCHARGE:  09/24/2007                               DISCHARGE SUMMARY   CHIEF COMPLAINT/HISTORY OF PRESENT ILLNESS:  This was one of several  admissions to [facility] for this 70 year old male who  relapsed on alcohol, made a statement about suicidal ideations, on  several drinking binges.  Alcohol use for the last 40 years.  Denied  suicidal ideas.  Endorsed that it is only when he gets intoxicated that  he starts talking like that.  He had been drinking six 40 ounces for the  last 4 weeks, also using Xanax p.r.n.   PAST PSYCHIATRIC HISTORY:  Last admission April 2009.   ALCOHOL AND DRUG HISTORY:  Persistent use of alcohol.   MEDICAL HISTORY:  Hypertension, non-insulin-dependent diabetes mellitus,  gastroesophageal reflux.   MEDICATIONS:  Celebrex 200 mg per day, Zantac 150 mg per day, Xanax that  he takes on a p.r.n. basis.   PHYSICAL EXAMINATION:  Failed to show any acute findings.   LABORATORY WORKUP:  Results not available in the chart.   MENTAL STATUS EXAM:  Alert, cooperative male.  Mood anxious.  Affect  anxious.  Thought processes logical, coherent and relevant.  Endorsed  that he relapsed,  but he was feeling stable enough that he wanted to be  discharged.  There were no active suicidal or homicidal ideas, no  hallucinations or  delusions.  Cognition well-preserved.   ADMISSION DIAGNOSES:  AXIS I:  Alcohol dependence, alcohol-induced  depression.  AXIS II:  No diagnosis.  AXIS III:  Hypertension, non-insulin-dependent diabetes mellitus,  gastroesophageal reflux.  AXIS IV:  Moderate.  AXIS V:  On admission 45, highest GAF in the last year 60.   COURSE IN THE  HOSPITAL:  He was admitted, started in individual and  group psychotherapy.  We pursued detox with Librium.  He was given  trazodone for sleep.  In less than 24 hours he endorsed that he was  ready to go, had some business to take care of, claimed that he was  committed.  Endorsed that he knew everything that had to be known to  stay sober, he just needed to do it.  As he was not in active  withdrawal, he was not suicidal or homicidal, we went ahead and  discharged to outpatient followup.   DISCHARGE DIAGNOSES:  AXIS I:  Alcohol dependence, alcohol-induced  depression.  AXIS II:  No diagnosis.  AXIS III:  Non-insulin-dependent diabetes mellitus, gastroesophageal  reflux, hypertension.  AXIS IV:  Moderate.  AXIS V:  On discharge 55.   Discharged on Celebrex 200 mg per day, glipizide, metformin 5/500 twice  daily, Zantac 150 mg per day, aspirin 81 mg per day, Exforge 5/160  daily.   Follow up with local mental health center, going to AA and reconnecting  with the sponsor.      Geoffery Lyons, M.D.  Electronically Signed     IL/MEDQ  D:  10/21/2007  T:  10/21/2007  Job:  [id number]

## 2010-08-28 NOTE — H&P (Signed)
NAME:  Ernest [name], Ernest [name]            ACCOUNT NO.:  [id number]   MEDICAL RECORD NO.:  [id number]          PATIENT TYPE:  IPS   LOCATION:  0306                          FACILITY:  [facility]   PHYSICIAN:  Anselm Jungling, MD  DATE OF BIRTH:  [birth year]   DATE OF ADMISSION:  12/01/2006  DATE OF DISCHARGE:                       PSYCHIATRIC ADMISSION ASSESSMENT   TIME:  10:30 a.m.   IDENTIFYING INFORMATION:  This is a 70 year old widowed white male.  This is a voluntary admission.   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  Today, he says that he has  been drinking for about the past 6 weeks and is not sure what caused him  to relapse.  His drinking was sharply escalated about 10 days ago with  the death of his ex-wife.  He reports even though they have been apart  for many, many, many years, they had still been recently speaking to  each other daily.  She had been hospitalized for a lung disease and her  death was a shock to him.  Alcohol level in the emergency room was 229.  His urine drug screen was positive for benzodiazepines and barbiturates.  He is denying suicidal thought today,  denying hallucinations.  Denying  formication.  He does admit to being feeling anxious, tremulous and  having some withdrawal symptoms.  No homicidal thoughts.   PAST PSYCHIATRIC HISTORY:  This is the patient's third or fourth  admission to [facility], with his last one  being November 23, 2003 to November 30, 2003 for a mood disorder and alcohol  abuse.  At that time, he was detoxed on Librium and had been stabilized  with lithium carbonate for mood swings, and also had some issues with  agitation that was treated with some Seroquel.  He reports he has not  seen a  psychiatrist in many years and had been treated with Lexapro by  his primary care physician, Dr. Archie Balboa, who prescribed him Lexapro.  He  says he has taken no medications regularly in some time.  He has a  history of prior admissions to [facility], [facility], and [facility].  In 2005, we referred  him to [facility] in [place] where he stayed until  [facility] came.  He has a history of alcohol dependence since  age 1 with a history of blackouts and severe alcohol abuse.  He does  have a history of longest abstinence 7 years from alcohol.   SOCIAL HISTORY:  A former [place] [name].  Lives in [place]  currently in his own apartment; has not been paying the rent and they  have asked him to leave.  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  He has [employer].  He  has now been widowed twice, and  he denies any current legal problems.  He has 2 sisters in the area that are supportive of him.   FAMILY HISTORY:  Father was an alcoholic.   MEDICAL HISTORY:  The patient is followed by Dr. Archie Balboa, MD at [facility].   CURRENT MEDICAL PROBLEMS:  1. Hypertension.  2. Diabetes mellitus type 2.  3. Tobacco abuse, 1-1/2 half packs per day.  4. Also has history of a hepatitis C.  5. History of peptic ulcer.  6. Possible history of chronic obstructive pulmonary disease.   CURRENT MEDICATIONS:  1. Lexapro, dose unclear.  2. Ranitidine hydrochloride twice a day.  3. Celebrex 200 mg daily.  4. Glipizide 5/500 one twice a day.  5. Metformin 500 mg 1 twice a day.  6. Also on Advicor 500/20.  7. Mucinex DM.  8. He has use p.r.n. indiplon tab 7.5 mg daily.   DRUG ALLERGIES:  None.   Medication compliance is unclear.   POSITIVE PHYSICAL FINDINGS:  A full physical exam was done in the  emergency room where his alcohol level was 229 mg/dL, temperature  04.5,  pulse 78, respirations 18, blood pressure 106/65.   DIAGNOSTIC STUDIES:  CBC:  WBC 7.8, hemoglobin 12.7, hematocrit 38.2,  platelets 331,000 and MCV 86.9.  His chemistry:  sodium 134, potassium  3.4, chloride 101, carbon dioxide 23, BUN 5, creatinine 0.74, random  glucose 86.  He actually at one point was hypoglycemic in the emergency  room, shaking and sweating, and random CBG was 57.  His liver enzymes  are within normal limits.   MENTAL STATUS EXAM:  Fully alert male, blunted affect.  Some psychomotor  slowing, but pleasant, cooperative.  He is complaining of feeling rather  shaky and anxious, but has been up and has been in the shower.  A bit  disheveled but cooperative.  Affect appropriate.  Speech is normal in  pace and tone.  He is forthcoming with his history, relatively  articulate.  Mood is depressed, ashamed of his drinking.  Quite a bit of  grief.  Still trying to recover from the shock of his ex-wife's death  which came rather suddenly.  Thought process is coherent.  Insight is  adequate.  He is denying any suicidal thought today.  Denying homicidal  thought. No evidence of psychosis.  His insight is adequate.  He is  requesting the support of maybe going to a 30-day program.  He feels  that [facility] well for him before and he was able  to get several months of sobriety together.  So we will look into a 30-  day program.  Cognition is completely intact.   AXIS I:  Depressive disorder not otherwise specified; rule out major  depression, recurrent, severe.  EtOH abuse and dependence.  AXIS II:  No diagnosis.  AXIS III:  Diabetes mellitus type 2, history of hepatitis C, history of  peptic ulcer, hypertension.  AXIS IV:  Severe issues with social isolation.  AXIS V:  Current 30, past year 35.   PLAN:  Voluntarily admit the patient with q. 15-minute checks in place.  We are going to hold because he was quite hypoglycemic in the emergency  room,  and he is still not eating properly.  We are going to hold off on  his glyburide today and reevaluate him tomorrow.  We are going to  decrease his Lexapro 5 mg daily and hold off on restarting his Advicor,  which he has  not been taking regularly.  We will restart him on 1  enteric-coated aspirin and Lexapro 5 mg  daily, trazodone 100 mg h.s. p.r.n. insomnia.  He has already spoken  with his sister and left her a message, and he hopes to get her involved  in helping close out his apartment.  We will also check a TSH on him.  Hopefully, his sister can come to a family session.  Estimated length of  stay is 5 days.      Margaret A. Lorin Picket, N.P.      Anselm Jungling, MD  Electronically Signed    MAS/MEDQ  D:  12/01/2006  T:  12/02/2006  Job:  [id number]

## 2010-08-28 NOTE — H&P (Signed)
NAME:  Ernest [name], Ernest [name] NO.:  [id number]   MEDICAL RECORD NO.:  [id number]          PATIENT TYPE:  IPS   LOCATION:  0403                          FACILITY:  [facility]   PHYSICIAN:  Anselm Jungling, MD  DATE OF BIRTH:  1940/05/20   DATE OF ADMISSION:  12/01/2008  DATE OF DISCHARGE:                       PSYCHIATRIC ADMISSION ASSESSMENT   IDENTIFICATION:  This is a 70 year old Caucasian male.  This is a  voluntary admission.   HISTORY OF PRESENT ILLNESS:  This is one of several [facility] admissions for  this 70 year old with a history of alcohol dependence.  He recently was  discharged from our unit on November 24, 2008.  He went out and  immediately relapsed on alcohol, drinking heavily, and presented back in  the emergency room.  At that time he was found to be hyponatremic with a  sodium of 121, and was admitted to the medical unit for stabilization.  They have evaluated his medication regimen and made some changes.  Today  he is fully alert, asking for help with his alcohol dependency.  No  active suicidal thoughts.   PAST PSYCHIATRIC HISTORY:  Multiple admissions to be [facility] for alcohol  abuse.  He is widowed.  Has several sisters in town.  One is listed as  his emergency contact.  States that he has a burned bridges with his  family, due to his alcohol use.  Has lived previously in assisted  living.  He is currently homeless.  No legal charges.  He endorses very  little support system.   FAMILY HISTORY:  He denies a family history of mental illness or  substance abuse.   PAST MEDICAL HISTORY:  No regular primary care [facility].  1. Medical problems include diabetes mellitus, type 2, now stabilized.  2. Previously uncontrolled hyperlipidemia.  3. Hypertension.  4. Chronic osteoarthritis.  5. Chronic pain.  6. History of gastroesophageal reflux disease.  7. Hepatitis-C per history.  8. Peripheral neuropathy.   PHYSICAL EXAMINATION:  Was done in the emergency room and  is noted in  the record.   DIAGNOSTIC STUDIES:  Were remarkable for a sodium of 121.  His CBG in  the medical unit ranged from 116 to 164.  Hemoglobin A1c of 6.9.  His  cholesterol was evaluated and it was determined that he did not require  a medication regimen for his cholesterol, so his statin was  discontinued.  He was not taking it regularly.  TSH 4.114.   MEDICATIONS:  1. At the time of transfer were aspirin:  It is discontinued.  2. Cymbalta 30 mg daily.  3. B+, one daily.  4. Prilosec 20 mg daily.  5. Celebrex 200 mg daily.  6. Glucophage 500 mg twice a day.  7. Neurontin 300 mg daily.  8. Trazodone 50 mg q.h.s.  9. Clonidine 0.1 mg twice a day.  10.Vistaril 25 mg q.4 h. p.r.n. for itching.   NOTATION:  It should be noted that his glipizide was discontinued.  Exforge was discontinued and Fioricet was discontinued.  His ranitidine  was discontinued, and he was started on Prilosec.   DRUG ALLERGIES:  Are none.   MENTAL STATUS EXAM:  A fully alert male, pleasant cooperative, asking  for help with his alcohol addiction, fully oriented.  No signs of  delirium.  No acute signs of withdrawal.  Insight is adequate.  Cognition preserved.   DIAGNOSES:  AXIS I:  Alcohol dependence, rule out depressive disorder  not otherwise specified.  AXIS II:  No diagnosis.  AXIS III:  1.  Diabetes mellitus, type 2.  1. Hypertension.  2. Osteoarthritis with chronic pain.  3. Gastroesophageal reflux disease.  4. Hepatitis-C by history.  5. Peripheral neuropathy.  AXIS IV:  Severe issues with homelessness, of lack of social supports,  social issues.  AXIS V:  Current 46.  Highest in the past year not known.   PLAN:  Is to voluntarily admit him to our dual-diagnosis unit with a  goal of stabilizing his mood and setting him up on a relapse prevention  program.      Young Berry. Lorin Picket, N.P.      Anselm Jungling, MD  Electronically Signed    MAS/MEDQ  D:  12/01/2008  T:   12/01/2008  Job:  [id number]

## 2010-08-28 NOTE — Discharge Summary (Signed)
NAME:  Ernest [name], Ernest [name] NO.:  [id number]   MEDICAL RECORD NO.:  [id number]          PATIENT TYPE:  IPS   LOCATION:  0305                          FACILITY:  [facility]   PHYSICIAN:  Jasmine Pang, M.D. DATE OF BIRTH:  Mar 13, 1941   DATE OF ADMISSION:  03/16/2008  DATE OF DISCHARGE:  03/21/2008                               DISCHARGE SUMMARY   IDENTIFICATION:  This is a 70 year old widowed white male who was  admitted on a voluntary basis on March 16, 2008.   HISTORY OF PRESENT ILLNESS:  The patient presents with a history of  depression and suicidal thoughts stating that he I just gave up on  life.  He states he was in an assisted living facility, was unhappy  there.  He decided to leave and got a ride to [facility] to look for  another place.  He was staying with friends and began drinking.  He  states he did drink four 40-ounce beers.  He states he has been  depressed for the past 3 months and states that it is a progressive  thing.  His sleep has been decreased.  His appetite has been  satisfactory.   PAST PSYCHIATRIC HISTORY:  The patient was here in the past for alcohol  use and detox.  He does not have any psychiatric followup at this time.   FAMILY HISTORY:  Father had alcohol problems.   ALCOHOL AND DRUG HISTORY:  As above.  He denies any drug use.  Urine  drug screen was positive for cocaine, however.   MEDICAL PROBLEMS:  Type 2 diabetes, hypertension, arthritis, and acid  reflux.   MEDICATIONS:  1. Glipizide 5 mg p.o. b.i.d.  2. Pravastatin 40 mg p.o. q.h.s.  3. Amlodipine - valsartan  5/160 mg daily.  4. Niacin and lovastatin 500/20 mg daily.  5. Campral 666 mg 3 times daily.  6. Aspirin 81 mg daily.  7. Celebrex 200 mg b.i.d.  8. Gabapentin 300 mg t.i.d.  9. Metformin 1000 mg daily.  10.Trazodone 100 mg at bedtime.  11.Lorazepam 1 mg p.o. q.i.d.  12.Oxycodone 7.5 mg/325 mL t.i.d.  13.Ranitidine 150 mg daily.   The patient reports  compliance with his medications.   DRUG ALLERGIES:  No known drug allergies.   PHYSICAL FINDINGS:  The patient was fully assessed at [facility].  There were no acute physical or medical problems noted.   ADMISSION LABORATORY:  CBC was within normal limits.  Alcohol level was  136, BUN was 5.  Urinalysis was negative.  UDS was positive for cocaine.  Chest x-ray revealed right lower lobe infiltrate or atelectasis.   HOSPITAL COURSE:  Upon admission, the patient was continued on  ranitidine 150 mg p.o. daily, oxycodone/APAP 7.5/325 mg p.o. t.i.d.,  trazodone 100 mg p.o. q.h.s., aspirin 81 mg daily, Celebrex 200 mg  daily, Exforge 5/160 mg daily, glipizide 5/500 mg twice daily, fish oil  1000 mg daily, metformin 500 mg p.o. b.i.d., gabapentin 100 mg t.i.d.,  trazodone 100 mg p.o. q.h.s., and Celexa 10 mg daily was begun.  The  patient tolerated these medications well  with no significant side  effects though he was somewhat constipated.  He required MiraLax 1  packet daily p.r.n. constipation.  In individual sessions, the patient  was friendly and cooperative.  He states he has been depressed over 3  months.  He began to drink a lot of beer.  He went to the ED because he  felt suicidal.  He feels like burned his bridges with his 2 sisters.  He  states at this time, he just used alcohol for a day and half.  On  March 18, 2008, the patient was still anxious and depressed.  He  discussed how he left his assisted-living and began to drink.  He feels  bad that he had done this and states he does not want to drink again.  On March 19, 2008, the patient's Celexa was increased to 20 mg p.o.  daily to get to a therapeutic dose.  He was also started on Nature's  Tear eye drops p.r.n. to his eyes for which he states he uses at home.  On March 20, 2008, the patient was less depressed, less anxious.  He  planned to return to his assisted living in [place].  However, on  March 21, 2008, the assisted living stated they did not want him to  return there.  They were going to hold his clothes for him and he was  going to be able to pick them up.  On March 21, 2008, mental status  had improved markedly from admission status.  Sleep was good, appetite  was good.  Mood was less depressed, less anxious.  Affect consistent  with mood.  There was no suicidal or homicidal ideation.  No thoughts of  self-injurious behavior.  No auditory or visual hallucinations.  No  paranoia or delusions.  Thoughts were logical and goal-directed, thought  content.  No predominant theme.  Cognitive was grossly intact.  Insight  good, judgment good, impulse control good.  The patient felt ready for  discharge.  As indicated above, his assisted-living [facility] stated that  he could not return to their establishment.  He was going to be  interviewed by [facility] and was planning to go there  instead.  He felt happy with this decision as he had wanted to move out  of them, [facility] any way.   DISCHARGE DIAGNOSES:  Axis I:  Major depressive disorder, recurrent,  severe, without psychosis.  Alcohol abuse.  AXIS II:  Features of personality disorder, not otherwise specified.  Axis III:  Hypertension, type 2 diabetes, arthritis, and acid reflux.  Axis IV:  Severe (problems with housing, psychosocial problems, medical  problems, burden of psychiatric and chemical dependence illness).  Axis V:  Global assessment of functioning was 50 upon discharge.  GAF  was 40 upon admission.  GAF highest past year was 65.   DISCHARGE PLANS:  His activity level was restricted by his need to walk  with a cane.  Diet is restricted to an ADA diabetic diet due to his  diabetes mellitus.   POSTHOSPITAL CARE PLANS:  The patient will go to [facility] for assisted  living today.  He will follow up at the [facility] for medication  management with a psychiatrist.   DISCHARGE  MEDICATIONS:  1. Glipizide/metformin 5/500 mg 1 twice a day.  2. Celebrex 200 mg daily.  3. Aspirin 81 mg daily over-the-counter.  4. Fish oil one daily.  5. Exforge 5/160 mg daily.  6.  Neurontin 100 mg 3 times a day.  7. Trazodone 100 mg at bedtime as needed for sleep.  8. Ranitidine 150 mg daily.  9. Celexa 20 mg daily.   The patient is to see his doctor for medical problems in 1 to 2 weeks  and further refills of his pain medications and resuming niacin and  pravastatin.  He is to stop the Campral and Ativan.      Jasmine Pang, M.D.  Electronically Signed     BHS/MEDQ  D:  03/21/2008  T:  03/22/2008  Job:  [id number]

## 2010-08-28 NOTE — Discharge Summary (Signed)
NAME:  Ernest [name], Ernest [name]            ACCOUNT NO.:  [id number]   MEDICAL RECORD NO.:  [id number]          PATIENT TYPE:  INP   LOCATION:  1520                         FACILITY:  [facility]   PHYSICIAN:  Renee Ramus, MD       DATE OF BIRTH:  July 13, 1940   DATE OF ADMISSION:  11/27/2008  DATE OF DISCHARGE:                               DISCHARGE SUMMARY   PRIMARY DISCHARGE DIAGNOSIS:  Alcohol use and dependence as well as  alcohol abuse.   SECONDARY DIAGNOSES:  1. Depression.  2. Diabetes mellitus type 2, uncontrolled.  3. Hyperlipidemia.  4. Hypertension.  5. Chronic osteoarthritis with chronic pain.  6. Gastroesophageal reflux disease.  7. Hepatitis C per history.  8. Peripheral neuropathy.   HOSPITAL COURSE:  1. Alcohol abuse and dependence.  The patient is a 70 year old male      with longstanding history of alcohol abuse, recently discharged      from the psychiatric center.  The patient's left the psychiatric      center and went on a drinking binge, and came to the emergency      department.  He was found to be hyponatremic with sodium of 121,      this is now corrected and he is being discharged back to the      psychiatric center.  Psychiatry saw him in consult and believed      that he did need to return for further evaluation and treatment.      The patient likely suffering from depression and posttraumatic      stress disorder.  The patient is now stable for discharge and we      are arranging his discharge pending on psychiatric bed.  2. Depression.  The patient will continue his SSRI.  3. Diabetes mellitus type 2.  The patient will continue his metformin.      We are discontinuing his other medications in an attempt to      simplify his drug regimen.  The patient's sugars just on metformin      have ran between 116-150 and believe this is adequately controlled      currently.  The patient did have a hemoglobin A1c of 6.9 which we      feel is close to baseline.  4.  Hyperlipidemia.  The patient is not currently taking statin      therapy.  He did have a full lipid panel done that showed no      evidence of elevated cholesterol to a degree that required      treatment.  The patient is not currently on a statin and will not      require statin treatment at this time.  5. Hypertension.  The patient will continue on clonidine, but we will      not continue him on Exforge.  We have had relatively good control      of his blood pressure with this and believe that we should simplify      his medications list as much as possible.  6. Osteoarthritis.  The patient will be on pain medication,  but would      strongly urge an interval approach, otherwise the patient has a      very likelihood of developing tolerance and addiction to higher and      higher levels of pain medication.  7. Gastroesophageal reflux disease.  The patient will continue proton      pump inhibitor.  8. Migraine headaches.  We are going to hold Fioricet at this time.   LABORATORY DATA:  Labs of note:  1. Normal CBC.  2. Blood glucose ranging between 116-164.  3. Initial hyponatremia with sodium 127 increasing to 132.  4. Hemoglobin A1c of 6.9.  5. Cholesterol panel with total cholesterol 145, HDL 41, LDL 85,      triglycerides 96.  6. TSH of 1.084.  7. Cortisol level 7.9.  8. Alcohol initially 126 decreasing to less than 5 in 24 hours.  9. Urine osmolality showing low at 114 consistent with dilutional      effects of alcohol.   DISCHARGE MEDICATIONS:  1. Aspirin which we are discontinuing.  The patient does not have      known coronary artery disease history.  Although he has diabetes      which is a risk factor, he also has a history of peptic ulcer      disease and believe that the harm would outweigh benefit on this      medication.  2. Cymbalta 30 mg p.o. daily.  3. Exforge 5/320 which we are discontinuing.  4. Folbee Plus 1 p.o. daily.  5. Prilosec 20 mg p.o. daily.  6.  Celebrex 200 mg p.o. daily.  7. Glipizide which we are asking him to discontinue.  8. Glucophage 500 mg p.o. b.i.d.  9. Neurontin 300 mg p.o. daily.  10.Trazodone 50 mg p.o. q.h.s.  11.Catapres 0.1 mg which we are changing to Clonidine 0.1 mg p.o.      b.i.d.  12.Fioricet which we are discontinuing.  13.Vistaril 25 mg p.o. q.6 h. p.r.n. pruritus.   There are no labs or studies pending at the time of discharge.   CONDITION ON DISCHARGE:  Stable condition and cranky.   Time spent 35 minutes.      Renee Ramus, MD  Electronically Signed     JF/MEDQ  D:  11/29/2008  T:  11/29/2008  Job:  [id number]

## 2010-08-28 NOTE — Discharge Summary (Signed)
NAME:  Ernest [name], Ernest [name]            ACCOUNT NO.:  [id number]   MEDICAL RECORD NO.:  [id number]          PATIENT TYPE:  INP   LOCATION:                                 FACILITY:   PHYSICIAN:  Isidor Holts, M.D.  DATE OF BIRTH:  [birth year]   DATE OF ADMISSION:  11/05/2007  DATE OF DISCHARGE:                               DISCHARGE SUMMARY   DATE OF DISCHARGE:  To be determined.   DISCHARGE DIAGNOSES:  1. Acute alcoholic intoxication.  2. Chronic alcohol abuse.  3. Alcohol withdrawal syndrome.  4. Depression/suicidal ideation.  5. Hypertension.  6. Osteoarthritis.  7. Type 2 diabetes mellitus.   DISCHARGE MEDICATIONS:  1. Multivitamin one p.o. daily.  2. Thiamine 100 mg p.o. daily.  3. Visine eye drops 0.05% solution 1 drop each eye as needed daily.  4. Exforge (5/160) one p.o. daily..  5. Aspirin 81 mg p.o. daily.  6. Protonix 40 mg p.o. daily.  7. Glipizide/metformin 5/500 one p.o. b.i.d.  8. Darvocet-N 100 one p.o. p.r.n. t.i.d.  9. Ativan 1 mg p.o. b.i.d. on November 09, 2007, then 1 mg on November 10, 2007, x1 dose then stop.  10.Celexa 20 mg p.o. daily.  11.Fish oil 1000 mg p.o. daily.   Note:  Ranitidine and Lexapro have been discontinued.   PROCEDURES:  1. Chest x-ray dated November 04, 2007.  This showed stable mild      cardiomegaly, no active lung disease.  2. Maxillofacial CT scan dated November 04, 2007.  This showed no acute      fracture maxillofacial region.  Nasal bone appears intact.      Zygomatic arches and orbital rims are intact.  Paranasal sinuses      are clear.  Mandibular condyle seen to be in normal position.  3. Head CT scan dated November 04, 2007. No acute intracranial      abnormalilites.   CONSULTATION:  [name], psychiatrist.   ADMISSION HISTORY:  As in H and P notes of November 05, 2007, dictated by  Dr. Lucita Ferrara.  However, in brief this is a 70 year old male, with  known history of multiple psychiatric admissions for alcohol abuse,  intoxication and withdrawal, hypertension, GERD, osteoarthritis,  transferred from [facility] ED to [facility], for alcoholic  intoxication and severe hyponatremia.  Reportedly, the patient had been  drinking constantly for the 4 days prior, and at the time of initial  evaluation in the emergency department expressed suicidal ideation.  He  was admitted for further evaluation, investigation and management.   CLINICAL COURSE:  1. Acute alcoholic intoxication.  The patient at the time of      presentation, had a blood alcohol level of 186.  He also reeked of      alcohol.  He was managed with intravenous fluid hydration, vitamin      supplementation, and by November 05, 2007, he appeared much more lucid      and oriented.  Alcohol level dropped to <5 on November 06, 2007, and      acute intoxication had resolved.  1. Chronic alcohol abuse/alcohol withdrawal syndrome.  The patient is      known to be a chronic alcohol abuser, and has had multiple      hospitalizations with alcoholic intoxication and withdrawal      phenomena.  He was therefore started on alcohol withdrawal      protocol.  He appeared quite disoriented on November 06, 2007, but      subsequently became much less agitated, more lucid, calm and      oriented.  By November 08, 2007, he was no longer tremulous and showed      no symptoms whatsoever of alcohol withdrawal.  It is anticipated      that Ativan protocol will be completed by November 10, 2007.   1. Hyponatremia.  This was likely secondary to volume depletion      against a background of chronic alcoholism.  The patient's serum      sodium level was 122 at the time of presentation with a chloride      level of 89.  He was managed with intravenous infusion of normal      saline and we are pleased to note that on November 08, 2007, sodium was      much improved at 130 and chloride was 98. Intravenous fluids have      been discontinued.   1. Hypertension.  This required multiple  antihypertensive medications      to control, including Norvasc, Diovan as well as Clonidine patch.      However, we were able to satisfactorily control the patient's BP      and on November 08, 2007, his blood pressure was normal at 137/80 mmHg.   1. Type 2 diabetes mellitus.  This was managed with carbohydrate      modified diet, as well as oral hypoglycemic medications in pre-      admission dosage.  The patient remained euglycemic during the      course of this hospitalization.   1. GERD.  The patient was asymptomatic from this viewpoint.  He      continues on proton pump inhibitor.   1. Suicidal ideation/depression.  As mentioned in admission history      above, the patient expressed suicidal ideation at the time of      initial evaluation in the emergency department.  He was therefore      placed on one-on-one sitter.  Psychiatric consultation was called,      which was kindly provided by [name].  For details of      his consultation, refer to consultation notes of November 05, 2007, and      November 06, 2007.  Conclusion, was that the patient had mood disorder      otherwise unspecified, was depressed and had alcohol dependence as      well as major depressive disorder recurrent and severe.  He      recommended commencing the patient on Celexa.  He initially      considered admitting the patient to the psychiatric ward for      antidepression and alcohol dependence treatment.  However, on      reevaluation November 06, 2007, he has opined that the patient may be      managed in a facility such as [facility], for residential chemical      dependency rehabilitation with a 12-step program.  He opined also,      that the patient did not appear to  be at risk of harming himself or      others and discontinued one-on-one sitter.   DISPOSITION:  The patient was on November 08, 2007, sufficiently clinically  recovered and stable for discharge to be contemplated.   PLAN:  The plan is to  discharge him as soon as appropriate placement is  effected.   DIET:  No restrictions.   ACTIVITY:  As tolerated.   FOLLOWUP INSTRUCTIONS:  The patient is recommended to continue to follow  up with his primary MD in [place], [place], or alternatively  establish a primary MD in [place].      Isidor Holts, M.D.  Electronically Signed     CO/MEDQ  D:  11/08/2007  T:  11/08/2007  Job:  [id number]

## 2010-08-28 NOTE — H&P (Signed)
NAME:  Ernest [name], Ernest [name] NO.:  [id number]   MEDICAL RECORD NO.:  [id number]          PATIENT TYPE:  IPS   LOCATION:  0305                          [facility]ACILITY:  [facility]   PHYSICIAN:  Jasmine Pang, M.D. DATE OF BIRTH:  March 09, 1941   DATE OF ADMISSION:  03/16/2008  DATE OF DISCHARGE:                       PSYCHIATRIC ADMISSION ASSESSMENT   PATIENT IDENTIFICATION:  A 70 year old male voluntarily admitted.   HISTORY OF PRESENT ILLNESS:  The patient presents with a history of  depression and suicidal thoughts stating that he just gave up on life.  He states he was in assisted living facility, unhappy there.  Went ahead  and got a ride to [place] to look for another place, was staying  with some friends, began drinking.  He states he did drink four 40-ounce  beers.  He states that he has been depressed for the past 3 months and  states that it is a progressive thing.  His sleep has been decreased,  his appetite has been satisfactory.   PAST PSYCHIATRIC HISTORY:  The patient has been here prior for alcohol  use and detox.   SOCIAL HISTORY:  This is a 70 year old male.  He is widowed.  He states  he has sisters that are supportive, but states he has burned bridges  with them.  [place]   FAMILY HISTORY:  Father with alcohol problems.   ALCOHOL AND DRUG HABITS:  As above.  Denies any drug use.  Urine drug  screen is positive for cocaine.   PRIMARY CARE Pragya Lofaso:  Dr. Noelle Penner.  Medical Director at the assisted  living facility.   MEDICAL PROBLEMS:  Type 2 diabetes, hypertension, arthritis and acid  reflux.   MEDICATIONS:  1. Glipizide 5 mg b.i.d.  2. Pravastatin 40 mg at bedtime.  3. Amlodipine.  4. Valsartan 5/160 daily.  5. Niacin.  6. Lovastatin 500/20 daily.  7. Campral 333 mg 2 tablets t.i.d.  8. Aspirin 81 mg daily.  9. Celebrex 200 mg b.i.d.  10.Gabapentin 300 mg t.i.d.  11.Metformin 1000 mg daily.  12.Trazodone 100 mg at bedtime.  13.Lorazepam 1 mg q.i.d.  14.Oxycodone 7.5/325 t.i.d.  15.Ranitidine 150 mg daily.   The patient reports compliance with his medications.   DRUG ALLERGIES:  No known allergies.   PHYSICAL EXAMINATION:  GENERAL:  Patient was fully assessed at [facility].  The patient did receive IV fluids Rocephin IM and Zofran.  X-ray  that showed a right lower lobe infiltrate or atelectasis.  His physical  exam was reviewed.  VITAL SIGNS:  Temperature 97.6, 69 heart rate, 21 respirations, blood  pressure is 123/75, 202 pounds, 6 feet 2 inches tall.  His laboratory  data shows a CBC within normal limits.  Alcohol level of 136, BUN 5.  Urine drug screen is positive for cocaine.  His chest x-ray right lower  lobe infiltrate or atelectasis.   MENTAL STATUS EXAM:  The patient at this time is in the bed.  He is  cooperative, fair eye contact.  He is shirtless and in jeans.  Speech is  clear, normal  pace and tone.  The patient's mood is depressed,  complaining of pain as well.  Affect again is cooperative, flat.  Thought process coherent.  No evidence of any psychotic symptoms.  Cognitive function intact.  Memory appears to be fair.  Judgment and  insight is fair.   DIAGNOSES:  AXIS I:  Major depressive disorder and alcohol abuse rule  out dependence, cocaine abuse.  AXIS II:  Deferred.  AXIS III:  Hypertension, type 2 diabetes, arthritis and acid reflux.  AXIS IV:  Problems with housing, psychosocial problems, medical  problems.  AXIS V:  Current is 40.   PLAN:  Detox the patient and work on relapse prevention.  Patient is  considered a fall risk.  Will address his substance use and medication  compliance and follow-up.  Case manager will review placement to another  facility if the patient desires.  We will initiate an antidepressant.  The patient reports he has not been on an antidepressant in the past.  We will start Celexa with risk and benefits discussed.   Will also check  a CBG during his stay.  His tentative length of stay at this time is 3-5  days.      Landry Corporal, N.P.      Jasmine Pang, M.D.  Electronically Signed    JO/MEDQ  D:  03/17/2008  T:  03/17/2008  Job:  [id number]

## 2010-08-28 NOTE — Discharge Summary (Signed)
NAME:  Ernest, [name] NO.:  [id number]   MEDICAL RECORD NO.:  [id number]          PATIENT TYPE:  IPS   LOCATION:  0403                          [facility]ACILITY:  [facility]   PHYSICIAN:  Ernest Jungling, MD  DATE OF BIRTH:  05-18-1940   DATE OF ADMISSION:  12/01/2008  DATE OF DISCHARGE:  12/07/2008                               DISCHARGE SUMMARY   IDENTIFYING DATA AND REASON FOR ADMISSION:  This is one of many  inpatient psychiatric admissions for Ernest [name], a 70 year old, unmarried  Caucasian male who again presented with depression and, as he put it  self-medication with beer.  He came to [place] via the Emergency Department  in [facility].  Please refer to the admission note for further  details pertaining to the symptoms, circumstances and history that led  to his hospitalization.  He was given an initial Axis I diagnosis of  alcohol dependence and depressive disorder NOS.   MEDICAL AND LABORATORY:  The patient came to [place] with a history of  diabetes mellitus, GERD, hypertension and chronic pain secondary to  osteoarthritis.  He was continued on his usual Glucophage, Prilosec,  Catapres, Neurontin, Celebrex, fish oil, and p.r.n. oxycodone.  There  were no acute medical issues during his stay.   HOSPITAL COURSE:  The patient was admitted to the adult inpatient  psychiatric service.  He presented as an affable, pleasant gentleman who  was mildly tremulous, alert, fully oriented and nonpsychotic.  He  reported that he had been drinking approximately 10 beers per day.  He  denied suicidal ideation and verbalized a strong desire for help.  He  was placed on a Librium based withdrawal protocol.  He participated in  therapeutic groups and activities.  He worked closely with case  management towards finding an assisted-living facility for him.  His  detoxification proceeded uneventfully and he remained absent of suicidal  ideation.  On the 8th hospital day, he was  discharged, having been  offered a bed at [facility].  He was to follow-up with Dr. Benna [name],  the in-house physician at [facility].  He agreed to the following  aftercare plan, otherwise.   AFTERCARE:  The patient is to follow-up with Dr. Benna [name] at [facility], first contact to be December 07, 2008 in the afternoon.   DISCHARGE MEDICATIONS:  1. Glucophage 500 mg b.i.d.  2. Prilosec 20 mg daily.  3. Catapres 0.1 mg b.i.d.  4. Trazodone 50 mg q.h.s.  5. Cymbalta 30 mg daily.  6. Neurontin 400 mg t.i.d.  7. Celebrex 200 mg b.i.d.  8. Fish oil 1 gram, 3 capsules daily.  9. Vistaril 25 mg t.i.d. as needed for agitation.  10.Oxycodone 5 mg q.6 h as needed for pain, not to exceed 2 per day.  11.Multivitamin with iron daily.   DISCHARGE DIAGNOSES:  AXIS I: Alcohol dependence, early remission and  substance-induced mood disorder.  AXIS II: Deferred.  AXIS III: History of diabetes mellitus, gastroesophageal reflux disease,  hypertension, chronic pain, osteoarthritis.  AXIS IV: Stressors severe.  AXIS V: Global assessment of functioning on discharge 55.  Ernest Jungling, MD  Electronically Signed     SPB/MEDQ  D:  12/08/2008  T:  12/08/2008  Job:  [id number]

## 2010-08-28 NOTE — Consult Note (Signed)
NAME:  Ernest [name]            ACCOUNT NO.:  [id number]   MEDICAL RECORD NO.:  [id number]          PATIENT TYPE:  INP   LOCATION:  1504                         FACILITY:  [facility]   PHYSICIAN:  Antonietta Breach, M.D.  DATE OF BIRTH:  Jan 16, 1941   DATE OF CONSULTATION:  11/13/2007  DATE OF DISCHARGE:  11/13/2007                                 CONSULTATION   INPATIENT CONSULTATION FOLLOWUP   Ernest [name] slept well with trazodone 25 mg q.h.s.   The patient has no thoughts of harming himself.  No thoughts of harming  others.  He has no delusions or hallucinations.  He is socially  appropriate and cooperative.   His interests are intact.  He has constructive future goals.  He is  motivated for chemical dependency rehabilitation.   REVIEW OF SYSTEMS:  GASTROINTESTINAL:  No loose stools or nausea from  his psychotropic agents, trazodone and Celexa.   LABORATORY DATA:  Sodium 134, potassium 4.6, BUN 14, creatinine 1.04.   PHYSICAL EXAMINATION:  VITAL SIGNS:  Temperature 97, pulse 74,  respiratory rate 18, blood pressure 124/72, O2 saturation on room air  96%.   MENTAL STATUS EXAM:  [name] is alert.  He is oriented to all  spheres.  His eye contact is good.  His affect is broad and appropriate.  Mood within normal limits.  Memory function is intact.  Speech is  normal.  Thought process logical, coherent, goal-directed.  No looseness  of associations.  Thought content:  No thoughts of harming himself.  No  thoughts of harming others.  No delusions, no hallucinations.  Judgment  is intact.  Insight is intact.  The patient is socially appropriate and  cooperative.   ASSESSMENT:  AXIS I:  1. 293.83, mood disorder not otherwise specified.  2. 296.35, major depressive disorder, recurrent, in partial remission.  3. Alcohol dependence.   RECOMMENDATIONS:  1. Would continue his Celexa trial at 20 mg daily for anti-depression.  2. Would continue his trazodone at 50 mg q.h.s. for  insomnia.  No      driving if drowsy.  3. Preliminary discharge planning:  The patient is psychiatrically      cleared for discharge.  Would recommend that he attend an inpatient      chemical dependency program and proceed with 12-step method and      group.   The patient agrees to call emergency services immediately for thoughts  of harming himself, thoughts of harming others or other psychiatric  emergency symptoms.      Antonietta Breach, M.D.  Electronically Signed     JW/MEDQ  D:  11/13/2007  T:  11/13/2007  Job:  [id number]

## 2010-08-28 NOTE — H&P (Signed)
NAME:  Ernest [name], Ernest [name]            ACCOUNT NO.:  [id number]   MEDICAL RECORD NO.:  [id number]          PATIENT TYPE:  IPS   LOCATION:  0404                          FACILITY:  [facility]   PHYSICIAN:  Anselm Jungling, MD  DATE OF BIRTH:  Feb 16, 1941   DATE OF ADMISSION:  02/28/2007  DATE OF DISCHARGE:                       PSYCHIATRIC ADMISSION ASSESSMENT   HISTORY OF PRESENT ILLNESS:  The patient reports with a history of  alcohol dependence. He states he relapsed on the day of discharge when  he was last hospitalized in October.  He reports a fall at home, hurting  his left arm.  He denies any history of seizures.  He denies any  suicidal thoughts but when he did present in the emergency room he  states that he guessed he was suicidal.  He reports drinking a 40-  ounce beer.   PAST PSYCHIATRIC HISTORY:  The patient has had several admissions to  [facility] for alcohol, recently discharged in October 2008 for  exacerbation of alcohol use.   PSYCHOSOCIAL HISTORY:  This is a 70 year old male who is widowed.  He  has been living in a boarding house on disability.   FAMILY HISTORY:  None.   ALCOHOL AND DRUG HISTORY:  As above.  No drug use.   PRIMARY CARE Tashon Capp:  In October the patient was seeing a Dr. Ivory Broad at  [facility].   MEDICAL PROBLEMS:  1. Non-insulin-dependent diabetes mellitus.  2. Hypertension.  3. GERD.  4. Knee pain.   MEDICATIONS:  The patient has been on:  1. Lexapro 20 mg daily.  2. Vistaril 25 mg b.i.d.  3. Deplin 7.5 daily.  4. Protonix 40 daily.  5. Singulair 10 daily.  6. Glucophage 500 mg b.i.d.  7. Norvasc 10 mg daily.   DRUG ALLERGIES:  NO KNOWN ALLERGIES.   PHYSICAL EXAM:  The patient is very sleepy.  He is resting in the bed at  this time.  Purplish bruises noted to his left arm.  He is not in any  other distress at this time.  The patient was fully assessed at [facility] where he received dextrose 50%.  His temperature is  97, 16  respirations, blood pressure 111/67.  His alcohol level was 293.  He was  hyponatremic with a sodium of 128.  CBG was 70.   MENTAL STATUS EXAM:  Again, he is resting, his speech is clear, he does  state a few words.  No overt evidence of any thought disorder.  He seems  aware of his situation.   Axis I:  Alcohol dependence; benzodiazepine abuse; depressive disorder,  not otherwise specified; rule out substance use; mood disorder.  Axis II:  Deferred.  Axis III:  Non-insulin-dependent diabetes mellitus, hypertension,  arthritis, hyponatremia, and gastroesophageal reflux disease.  Axis IV:  Psychosocial problems, medical problems.  Axis V:  Current is 35-40.   PLAN:  To detox the patient, work on relapse prevention.  Initiate the  Librium protocol, encourage fluids.  Will check a CBG.  The patient is  considered a fall risk.  Will also recheck his sodium.  Case  manager is  to assess his living situation and will obtain more history, identify  triggers.  Tentative length of stay is 5-7 days.      Landry Corporal, N.P.      Anselm Jungling, MD  Electronically Signed    JO/MEDQ  D:  03/01/2007  T:  03/02/2007  Job:  [id number]
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
## 2010-08-28 NOTE — Consult Note (Signed)
NAME:  Ernest Ernest [name], Ernest [name] NO.:  [id number]   MEDICAL RECORD NO.:  [id number]          PATIENT TYPE:  INP   LOCATION:  1504                         FACILITY:  [facility]   PHYSICIAN:  Antonietta Breach, M.D.  DATE OF BIRTH:  1940/12/01   DATE OF CONSULTATION:  11/11/2007  DATE OF DISCHARGE:                                 CONSULTATION   Mr. Ernest Ernest [name] Ernest [name] is still having insomnia.  He has not received trazodone  yet. He has continued to require Ativan 1 mg today and yesterday for  feeling on edge.   He has intact orientation and memory function.  He is cooperative with  healthcare. He has no thoughts of harming himself or others.  He has no  delusions or hallucinations.   REVIEW OF SYSTEMS:  GASTROINTESTINAL:  No nausea or loose stools with  the Celexa.   LABORATORY DATA:  Sodium 129, BUN 12, creatinine 0.85, glucose 151.   EXAMINATION:  VITAL SIGNS:  Temperature 97.8, pulse 70, respiratory rate  20, blood pressure 139/89, O2 saturation on room air 97%.   MENTAL STATUS EXAM:  Mr. Ernest Ernest [name] is alert.  He is oriented to all  spheres.  Eye contact is good.  Affect still mildly constricted.  Mood  is within normal limits.  His memory function is intact to immediate  recent and remote.  Thought process is logical, coherent, goal-directed.  Thought content- no thoughts of harming himself.  No thoughts of harming  others. No delusions, no hallucinations.  Insight intact. Judgment  intact.   ASSESSMENT:  1.)  293.83  Mood disorder, not otherwise specified.  2.)  296.3  Major depressive disorder.  3.)  293.84  Anxiety disorder, not otherwise specified.  4.)  Alcohol dependence.   RECOMMENDATIONS:  1. Will start trazodone 25 mg nightly with 25 mg repeated in 1 hour if      insomnia still present.  2. No change in Celexa 20 mg daily for antidepression/antianxiety.      Antonietta Breach, M.D.  Electronically Signed     JW/MEDQ  D:  11/11/2007  T:  11/11/2007  Job:  [id number]

## 2010-08-28 NOTE — Discharge Summary (Signed)
NAME:  Ernest [name], Ernest [name]            ACCOUNT NO.:  [id number]   MEDICAL RECORD NO.:  [id number]          PATIENT TYPE:  INP   LOCATION:  1520                         FACILITY:  [facility]   PHYSICIAN:  Peggye Pitt, M.D. DATE OF BIRTH:  1940/06/21   DATE OF ADMISSION:  11/27/2008  DATE OF DISCHARGE:  12/01/2008                               DISCHARGE SUMMARY   ADDENDUM:  This is an addendum to a discharge summary dictated by Dr.  Janice Norrie on November 29, 2008.  This is just to amend the discharge date.  Patient stayed in the hospital an extra 2 days secondary to no beds  being available at [facility].  A bed has opened today and he  will be discharged to [facility] today.      Peggye Pitt, M.D.  Electronically Signed     EH/MEDQ  D:  12/01/2008  T:  12/01/2008  Job:  [id number]
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
## 2010-08-28 NOTE — H&P (Signed)
NAME:  REMY, VOILES NO.:  [id number]   MEDICAL RECORD NO.:  [id number]          PATIENT TYPE:  INP   LOCATION:  0101                         FACILITY:  [facility]   PHYSICIAN:  Massie Maroon, MD        DATE OF BIRTH:  November 03, 1940   DATE OF ADMISSION:  11/27/2008  DATE OF DISCHARGE:                              HISTORY & PHYSICAL   The patient is unassigned.   CHIEF COMPLAINT:  Hyponatremia, alcohol intoxication.   HISTORY OF PRESENT ILLNESS:  A 70 year old male with a history of  alcohol abuse, last dependence, depression, complains of worsening  depression.  He wants alcohol detox and so he came to the ER.  He was  noted to have a sodium of 121.  Psychiatry would not accept the patient  and need medical clearance.  His alcohol level was 126.  The patient  notes that he does occasionally get shaky when he stops drinking.  He is  a heavy drinker.   The patient was noted to have a clean urine drug screen.  There was no  tricyclics in his urine drug screen as well.  His salicylate level was  normal.  His Tylenol level was normal.  His BUN and creatinine were  within normal limits and his hemoglobin was intact to 14.3.  The patient  noted that he had thought about hurting himself but was not really  serious about the situation; however, we will direct him to one-to-one  observation for safety.  The patient has no suicidal ideation or  homicidal ideation at the current time.   PAST MEDICAL HISTORY:  1. Alcohol abuse/dependence.  2. Depression.  3. Diabetes.  4. Hyperlipidemia.  5. Hypertension.  6. Osteoarthritis.  7. GERD.  8. (?)  Hepatitis C per history and physical September 21, 2002.  9. History of fracture of the right C3 articular pill and right      lamina.  10.Lumbar 1 and lumbar 2 transverse process fractures.  11.Neuropathy.   PAST SURGICAL HISTORY:  Unknown, patient unable to provide due to  intoxication.   SOCIAL HISTORY:  The patient is a current  smoker.  He is a heavy  drinker.   FAMILY HISTORY:  The patient is unable to provide clearly due to  intoxication.   REVIEW OF SYSTEMS:  Negative for all 10 organ systems except for  pertinent positives stated above.   ALLERGIES:  No known drug allergies.   MEDICATIONS:  Per ER MEDICAL RECORD NUMBER 1. Glipizide.  2. Metformin,  3. Exforge.  4. Percocet.  5. Lipitor  (All unknown doses and once again the patient is unable to provide clear  information due to intoxication).   PHYSICAL EXAMINATION:  VITAL SIGNS:  Temperature 98.5, pulse 79,  respiratory rate 20, blood pressure 100/56, pulse ox 92% on room air.  HEENT: Anicteric, EOMI, no nystagmus, pupils 1.5 mm, symmetric, direct,  consensual, near reflexes intact.  Mucous membranes moist.  NECK:  No JVD, no bruit, no thyromegaly, no adenopathy.  HEART:  Regular rate and rhythm.  S1-S2. No murmurs, gallops or rubs.  LUNGS:  Lungs are clear to auscultation bilaterally.  ABDOMEN: Soft, nontender, nondistended.  Positive bowel sounds.  EXTREMITIES: No cyanosis, clubbing or edema.  SKIN: No rashes.  LYMPH NODES:  No adenopathy.  NEURO EXAM:  Nonfocal, cranial nerves II-XII intact, reflexes 2+,  symmetric, diffuse with downgoing toes bilaterally, motor strength 5/5  in all four extremities, pinprick intact   LABORATORY DATA:  Urine drug screen negative. Salicylate less than 4.0.  Alcohol level 126.  Tylenol level less than 10.0. Sodium 121, potassium  3.8, chloride 90, bicarb 17, BUN 5, creatinine 0.7.  WBC 8.8, hemoglobin  13.1, platelet count 307.   ASSESSMENT/PLAN:  1. Hyponatremia:  Probably secondary to alcohol intoxication; however,      will check the serum osmolarity, urine osmolarity, urine sodium,      cortisol, and TSH.  Will hydrate gently with normal saline.  2. Alcohol intoxication:  Librium 50 mg p.o. x1 and then Librium 25 mg      p.o. q. day x1 day also Ativan 1 mg IV/p.o. q.4 h. p.r.n.      agitation/withdrawal  symptoms.  3. Diabetes:  The patient is possibly on some oral agents; however, we      are not sure about his medications so we will cover with      fingerstick blood sugars a.c. and h.s. and insulin sliding scale      with NovoLog sensitive sliding scale.  4. Hyperlipidemia:  The patient appears to be on Lipitor; however,      once again unknown dose.  We will hold off on ordering any      cholesterol medication until we find out what his liver function      tests appear like.  Physicians in the morning can determine what      his clear medications are when he is less intoxicated.  5. Hypertension:  The patient appears to be on Exforge; however, once      again he is not clear about his medications.  We will cover with      hydralazine 10 mg IV q.6 h p.r.n. systolic blood pressure greater      than 160/110.  6. Depression/?  bipolar disorder:  The patient does not have been      depression medication listed on his med rec.  We will abstain from      providing him with any medications at this time.  Physicians in the      morning can obtain a psychiatry consult.  He also needs psychiatric      input so he can go to detox.  There is some question early about      some suicidal ideation.  He does not appear suicidal at this time.      However, we will have him sit with a 1:1 direct observation.  7. DVT prophylaxis:  SCDs and TEDs.      Massie Maroon, MD  Electronically Signed     JYK/MEDQ  D:  11/27/2008  T:  11/27/2008  Job:  [id number]

## 2010-08-28 NOTE — Discharge Summary (Signed)
NAME:  Ernest [name], Ernest [name] NO.:  [id number]   MEDICAL RECORD NO.:  [id number]          PATIENT TYPE:  IPS   LOCATION:  0403                          [facility]ACILITY:  [facility]   PHYSICIAN:  Anselm Jungling, MD  DATE OF BIRTH:  Apr 14, 1941   DATE OF ADMISSION:  02/28/2007  DATE OF DISCHARGE:  03/06/2007                               DISCHARGE SUMMARY   IDENTIFYING DATA AND REASON FOR ADMISSION:  This was an inpatient  psychiatric admission for Ernest [name], a 70 year old white male, one of  many admissions to our inpatient psychiatric facility.  He had most  recently been treated here in October 2008 for alcohol dependence and  detoxification.  He returned this time, having relapsed on alcohol, and  having depression and suicidal ideation.  Please refer to the admission  note for further details pertaining to the symptoms, circumstances and  history that led to his hospitalization.  He was given an initial Axis I  diagnosis of alcohol dependence.   MEDICAL AND LABORATORY:  The patient was medically and physically  assessed by the psychiatric nurse practitioner.  He came to [place] with a  history of GERD, chronic knee pain, seasonal allergies, non-insulin  dependent diabetes mellitus, and hypertension.  He was continued on his  usual __________  7.5 mg daily, Protonix, Singulair, Glucophage, and  Norvasc.   HOSPITAL COURSE:  The patient was admitted to the adult inpatient  psychiatric service.  He presented as a well-nourished, well-developed  male who walked with a cane.  He was initially disheveled and  disorganized.  He was placed on a Librium withdrawal protocol, and his  detoxification proceeded uneventfully.  He participated in various  therapeutic groups and activities including those geared towards 12-step  recovery.   The patient worked closely with case manager and the undersigned towards  realizing a plan in which he could go directly to a sober living  facility.  This took  some doing, but it was felt that the additional  time he spent in our program was put to good use.  By the time of his  discharge, he was looking much better, stronger, clearer, and in good  spirits.   He had been continued on Lexapro 10 mg daily to address depressive  symptoms.   AFTERCARE:  The patient was to follow-up with Dr. Ivory Broad at [facility]  family practice for walk-in appointment following discharge.  This was  to address his medical problems.  He was encouraged to  attend 12-step  meetings as often as possible.   DISCHARGE DIAGNOSES:  AXIS I: Alcohol dependence, and depressive  disorder NOS.  AXIS II: Deferred.  AXIS III: History of GERD, seasonal allergies, non-insulin-dependent  diabetes mellitus, hypertension, chronic pain.  AXIS IV: Stressors severe.  AXIS V: GAF on discharge 55.      Anselm Jungling, MD  Electronically Signed     SPB/MEDQ  D:  03/06/2007  T:  03/06/2007  Job:  [id number]

## 2010-08-28 NOTE — Consult Note (Signed)
NAME:  Ernest [name], Ernest [name]            ACCOUNT NO.:  [id number]   MEDICAL RECORD NO.:  [id number]          PATIENT TYPE:  IPS   LOCATION:  NA                            FACILITY:  [facility]   PHYSICIAN:  Antonietta Breach, M.D.  DATE OF BIRTH:  1940/04/20   DATE OF CONSULTATION:  09/23/2007  DATE OF DISCHARGE:  09/21/2007                                 CONSULTATION   REASON FOR CONSULTATION:  1. Depression.  2. Anxiety.  3. Alcohol dependence.   HISTORY OF PRESENT ILLNESS:  Ernest [name] is a 71 year old male  presenting with several weeks of depressed mood, decreased energy,  difficulty concentrating, anhedonia and not wanting to live anymore.  He  has been drinking six 40 ounces beers per day.  He has also been taking  Xanax, an unknown amount.   He is socially appropriate and cooperative.  He does not have any  hallucinations or delusions.  He is motivated for care.   His depressive symptoms have been occurring for approximately 4 weeks  and increasing.   The patient has been started on some Ativan 2 mg q.6 h., which he is  responding to.   PAST PSYCHIATRIC HISTORY:  The patient was admitted to the [facility] in April 2009.  At that time, he had relapsed  on alcohol, and he was also saying that he had, had 5-6 months of  sobriety before his binge.   The patient has had multiple admissions to the [facility].  He has been removed from a halfway house in the past due to alcohol  relapse.   FAMILY PSYCHIATRIC HISTORY:  His father was an alcoholic.   SOCIAL HISTORY:  Ernest [name] has been living in a house with housemates.  He is widowed.  He is retired.  He has no history of illegal drugs.   PAST MEDICAL HISTORY:  Alcohol intoxication.  The patient has also been  having epigastric pain.  He has a history of asthma, hypertension,  diabetes and arthritis.   ALLERGIES:  NO KNOWN DRUG ALLERGIES.   MEDICATIONS:  MAR is reviewed.  The patient is on Ativan  2 mg q.6 h.,  hold for sedation.   LABORATORY DATA:  The alcohol was 268 when first arriving.  BUN 7,  creatinine is 0.64, SGOT 31, SGPT 35.   REVIEW OF SYSTEMS:  Constitutional, head, eyes, ears, nose, throat,  mouth neurologic, psychiatric cardiovascular, respiratory,  gastrointestinal, genitourinary, skin, musculoskeletal, hematologic  lymphatic, endocrine and metabolic all unremarkable.   PHYSICAL EXAMINATION:  VITAL SIGNS:  Temperature 97.3, pulse 70,  respiratory rate 17, blood pressure 112/71.  GENERAL APPEARANCE:  On hand extension, Ernest [name] does not have any  tremor.  He is not having any sweats.   MENTAL STATUS EXAM:  Thought content, please see above.  Ernest [name] is  oriented to all spheres.  His eye contact is good.  His attention span  is mildly decreased.  Concentration mildly decreased.  Memory is intact  to immediate, recent and remote.  Affect constricted.  Mood depressed.  Speech is mildly impoverished with a mildly flat  prosody.  No  dysarthria.  Thought process logical, coherent, goal-directed.  No  looseness of associations.  Insight is partial, judgment is intact.   ASSESSMENT:  Axis I:  1. 293.83, mood disorder, not otherwise specified (functional and      general medical factors), depressed.  2. 296.33, major depressive disorder, recurrent, severe.  3. 293.84, anxiety disorder, not otherwise specified.  4. Alcohol dependence  Axis II:  Deferred.  Axis III:  See past medical history.  Axis IV:  Primary support group.  Axis V:  Global Assessment of Functioning 30.   Ernest [name] would benefit from an inpatient dual diagnosis psychiatric  program.  He is at risk to harm self.   RECOMMENDATIONS:  1. Would continue with the Ativan protocol, including thiamine 100 mg      daily.  2. Will defer other psychotropic medications at this time until he is      on the inpatient psychiatric unit.  3. The patient will have eventual access to 12-step groups and  would      be a candidate for a chemical dependency inpatient residential      program after his acute inpatient psychiatric stay.      Antonietta Breach, M.D.  Electronically Signed     JW/MEDQ  D:  09/23/2007  T:  09/23/2007  Job:  [id number]
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
## 2010-08-28 NOTE — H&P (Signed)
NAME:  Ernest [name], POWER NO.:  [id number]   MEDICAL RECORD NO.:  [id number]          PATIENT TYPE:  INP   LOCATION:  1413                         FACILITY:  [facility]   PHYSICIAN:  Lucita Ferrara, MD         DATE OF BIRTH:  March 21, 1941   DATE OF ADMISSION:  11/05/2007  DATE OF DISCHARGE:                              HISTORY & PHYSICAL   PRIMARY CARE PHYSICIAN:  The primary care doctor is unassigned.   CHIEF COMPLAINT AND HISTORY OF THE PRESENT ILLNESS:  The patient is a 70-  year-old transferred from [facility] at [facility] after  being found to have alcohol intoxication and severe hyponatremia.  Thus  the request was made for transfer prior to any inpatient psychiatric  evaluation.  The patient apparently has been drinking constantly for the  last 4 days and says that I fell upon the dog house and suffered.  Currently in the examining room the patient is very belligerent, and is  yelling and screaming.  The rest of the history cannot be obtained  secondary to this type of behavior and the patient not being  cooperative.   PAST MEDICAL HISTORY:  The patient has a past medical history  significant for:  1. Multiple psychiatric admissions for alcohol abuse, alcohol      intoxication and alcohol withdrawal.  2. Hypertension.  3. Gastroesophageal reflux disease.  4. Osteoarthritis.  5. Per the ED records the patient is also exhibiting suicidal      ideations.   CURRENT MEDICATIONS:  1. Acetaminophen.  2. Aspirin.  3. Celebrex.  4. Exforge.  5. Fish oil.  6. Glipizide.  7. Lexapro.  8. Ranitidine.  9. Ophthalmic drops.   REVIEW OF SYSTEMS:  The review of systems is as per the HPI and  otherwise is not obtainable.   PHYSICAL EXAMINATION:  GENERAL APPEARANCE:  Patient is very anxious and  belligerent, and is yelling and screaming.  VITAL SIGNS:  Blood pressure is 123/75, pulse is 73, respirations are 20  and temperature is 98.2.  HEENT:  The head  is normocephalic and atraumatic.  HEART:  Cardiovascular - S1 and S2 with regular rate and rhythm.  No  murmurs, rubs or clicks.  ABDOMEN:  The abdomen is soft, nontender and nondistended.  Positive  bowel sounds.  LUNGS:  The lungs are clear to auscultation bilaterally.  No rhonchi,  rales or wheezes.  EXTREMITIES:  The extremities reveal no clubbing, cyanosis or edema.   LABORATORY DATA:  The EKG showed normal sinus rhythm, pulse 71 and ST-T  wave changes.  I believe the Q-waves on the EKG performed at [facility] are located in the anterior leads; however, there is no  EKG to take a look at right here.  The hospital course/ED course at [facility] included the patient having a CT scan of  the maxillofacial area, which shows atrophy and small vessel disease.  There are no acute fractures.  CT scan of the head shows no acute  abnormalities.  There is atrophy and small  small-vessel disease as noted  previously.  Basic metabolic panel; sodium 122 and chloride 89.  Urine  drug screen was essentially negative.  Blood alcohol level was 186.  INR  1.0.  CBC normal.   ASSESSMENT/PLAN:  The patient is a 70 year old with;  1. Alcohol intoxication.  2. Facial abrasions.  3. Suicidal ideations.  4. Mild hyponatremia.   DISCUSSION AND PLAN:  It does appear that the patient has had multiple  similar events with alcohol intoxication and multiple psychiatric  evaluations for suicidal ideations.  Given these findings we will go  ahead and admit the patient for observation until transfer to  psychiatric facility.  As far as the rest of his medical problems are  concerned the seem to be quite stable at this point.  We will continue  his the Glucophage for his diabetes.  We will continue aspirin and  thiamine.  We will put him on multi-vitamin, thiamine, folate and  continue Diovan for his hypertension.  We will initiate Ativan withdrawl  protocal.  A  one-on-one sitter is essential at this point given his  suicidal ideation.  We will monitor his lytes and hyponatremia, which is  122 now and we will continue to monitor.  We will also monitor for signs  of cerebral edema.  IV fluids are to run slowly so as not to increase  his sodium too fast.  The rest of the plans will depend on his progress.      Lucita Ferrara, MD  Electronically Signed     RR/MEDQ  D:  11/05/2007  T:  11/05/2007  Job:  [id number]

## 2010-08-28 NOTE — Consult Note (Signed)
NAME:  Ernest [name], LONGSHORE NO.:  [id number]   MEDICAL RECORD NO.:  [id number]          PATIENT TYPE:  INP   LOCATION:  1504                         FACILITY:  [facility]   PHYSICIAN:  Antonietta Breach, M.D.  DATE OF BIRTH:  04-Sep-1940   DATE OF CONSULTATION:  11/09/2007  DATE OF DISCHARGE:                                 CONSULTATION   Mr. Shayaan does continue with decreased energy and some difficulty  concentrating.  He is having intolerable insomnia.  However, his mood is  still improved over admission.  He does not have any thoughts of harming  himself or others.  He has no hallucinations or delusions.  He has  intact hope and some interests.   His orientation and memory function are intact.  He is cooperative with  care.   REVIEW OF SYSTEMS:  GASTROINTESTINAL:  No complaints with Celexa or  adverse effects.   LABORATORY DATA:  The patient's sodium was 122 on July 22 and it is 128  today.  Potassium 4.7, BUN 16, creatinine 0.8, glucose 139.   PHYSICAL EXAMINATION:  VITAL SIGNS:  Temperature 98.4, pulse 60,  respiratory rate 21, blood pressure 154/84, O2 saturation on room air  95%.  MENTAL STATUS EXAM:  Mr. Gille has good eye contact.  He is alert.  He  is oriented to all spheres.  Affect mildly constricted.  Mood is mildly  depressed.  Memory function intact.  Speech within normal limits.  Thought process logical, coherent, goal-directed.  No looseness of  associations.  Thought content, no thoughts of harming himself.  No  thoughts of harming others no delusions, no hallucinations.  Insight is  intact for his depression and alcohol dependence.  Judgment is intact.  His affect is constricted.  Mood depressed.   ASSESSMENT:  1. 293.83 mood disorder not otherwise specified (idiopathic and      general medical factors), depressed.  2. Alcohol dependence.  3. Major depressive disorder recurrent.   The indications, alternatives and adverse effects of trazodone  were  discussed with the patient for insomnia including the risk of priapism  resulting in surgery induced impotence.  The role of trazodone in  augmenting Celexa was also discussed.  The patient understands and would  like to start trazodone.   RECOMMENDATIONS:  1. Would add trazodone at 25-50 mg nightly p.r.n. insomnia.  2. Based on the trazodone needed at night, would proceed with a      standing dose of the trazodone required for solid sleep.  3. Trazodone can be increased by 25 mg per evening until the patient      has solid sleep, usually not more than 200 mg nightly.  4. Would continue the Celexa 20 mg daily for anti depression      antianxiety.  5. Although the patient does have a history of hyponatremia, would      continue to look at Celexa or trazodone as      potential factors if the sodium does not correct.  6. The patient ideally could proceed with a chemical dependency      inpatient rehabilitation program.  He is psychiatrically cleared      now to attend one.      Antonietta Breach, M.D.  Electronically Signed     JW/MEDQ  D:  11/09/2007  T:  11/09/2007  Job:  [id number]

## 2010-08-28 NOTE — Consult Note (Signed)
NAME:  BRAYDEN, BRODHEAD NO.:  [id number]   MEDICAL RECORD NO.:  [id number]          PATIENT TYPE:  INP   LOCATION:  1413                         FACILITY:  [facility]   PHYSICIAN:  Antonietta Breach, M.D.  DATE OF BIRTH:  [birth year]   DATE OF CONSULTATION:  11/05/2007  DATE OF DISCHARGE:                                 CONSULTATION   REQUESTING PHYSICIAN:  [facility].   REASON FOR CONSULTATION:  Depression, suicidal ideation, alcohol  dependence.   HISTORY OF PRESENT ILLNESS:  Mr. Esty set is a 70 year old male  admitted to the [facility] on November 05, 2007 with severe  alcohol dependence, diabetes mellitus, depression and suicidal ideation.   Mr. Wiberg has been having at least 2 weeks of depressed mood,  decreased energy, difficulty concentrating, anhedonia and suicidal  thought.  The patient went back to drinking alcohol with a prior history  of multiple relapses.   Currently on the general medical floor, he is undergoing an [place]  taper.  He is cooperative with care, noncombative.  His mood is severely  depressed.  He has been having suicidal thoughts.  However, they are  less frequent now and he is able to push them out and dismiss them.  He  is not having any hallucinations while fully awake.  He is not having  any delusions.  His memory and orientation function are intact.   He is having some hypnopompic, slight auditory hallucinations that  resolve once he becomes fully awake.   PAST PSYCHIATRIC HISTORY:  Mr. Marietta has a history of multiple major  depressive episodes and psychiatric admissions.  He has been admitted to  the [facility] multiple times including October of  2008, November of 2008, April of 2009,  June of 2009.   He has been treated with Lexapro successfully for anti-depression.  He  also has required Xanax for feeling on edge, excessive worry, muscle  tension.  At one point the patient's alcohol  tolerance developed due to  drinking five 14-ounce beers daily.  That was in October of 2008.   The patient is familiar with the 12-step method.   FAMILY PSYCHIATRIC HISTORY:  None known.   SOCIAL HISTORY:  Unemployed, widowed.  The patient has been living with  some immigrants who speak very little [place].  They also drink beer.  He has been having poor fellowship in overcoming his addiction.  He does  not use any illegal drugs.   PAST MEDICAL HISTORY:  1. Hypertension.  2. Gastroesophageal reflux disease.  3. Osteoarthritis.   MEDICATIONS:  The MAR is reviewed.  The patient is on:  1. Ativan taper.  2. Multivitamin daily.  3. Thiamine 100 mg daily.  4. Ativan 1-2 mg every 4 hours p.r.n.   HE HAS NO KNOWN DRUG ALLERGIES.   LABORATORY DATA:  WBC 8.5, hemoglobin 14.4, platelet count 303,  hemoglobin A1c 6.4.  Sodium 131, BUN 6, creatinine 0.75, glucose 128.  Urine drug screen on September 21, 2007 was unremarkable.  His current INR is  1.0.  His alcohol on presentation was 186.  SGOT 30, SGPT 27.  Head CT  without contrast showed no acute abnormalities.   REVIEW OF SYSTEMS:  CONSTITUTIONAL, HEAD, EYES, EARS, NOSE, THROAT,  MOUTH,  NEUROLOGIC, PSYCHIATRIC, CARDIOVASCULAR, RESPIRATORY,  GASTROINTESTINAL, GENITOURINARY, SKIN, MUSCULOSKELETAL, HEMATOLOGIC,  LYMPHATIC, ENDOCRINE, METABOLIC:  All unremarkable.   EXAMINATION:  VITAL SIGNS:  Temperature 98.5, pulse 84, respiratory rate  24, blood pressure 145/86, O2 saturation on room air 97%.  GENERAL APPEARANCE:  Mr. Velardi is an elderly male lying in a right  lateral decubitus position in his hospital bed.  His eye contact is  intact.  He is oriented to the year, day of the month, day of the week,  place and person.  His attention span is mildly decreased.  Concentration is mildly decreased.  Affect is constricted.  Mood is  depressed.  His fund of knowledge and intelligence are within normal  limits.  Speech is soft, but there is  no dysarthria.  He does have  normal rate and prosody.  Thought process logical, coherent, goal-  directed.  No looseness of associations.  Thought content.  He has no  suicidal thoughts at the time of the exam.  He has hopelessness and  helplessness.  He has no delusions or hallucinations.   Insight is intact.  Judgment is intact.   ASSESSMENT:  Axis I:  293.83 mood disorder, not otherwise specified  (idiopathic and general medical factors), depressed.  Alcohol  dependence.  Major depressive disorder, recurrent, severe.  Axis II:  Deferred.  Axis III:  See past medical history.  Axis IV:  Primary support group.  Axis V:  40.   The undersigned provided ego supportive psychotherapy and education.   The indications, alternatives and adverse effects of Ativan and Celexa  were discussed with the patient.  He understands and wants to proceed as  follows.   RECOMMENDATIONS:  1. The patient does agree to call the nursing station immediately if      he has a return of suicidal thoughts.  2. Discontinue the sitter.  3. Proceed with Ativan taper for alcohol withdrawal prevention.      Proceed with a multivitamin every day and thiamine 100 mg daily.   RECOMMENDATIONS:  1. Celexa 10 mg p.o. q.a.m. Would increase to 20 mg p.o. q.a.m. in 2      days as tolerated.  2. Would admit to a psychiatric ward as soon as possible for anti-      depression and alcohol dependence treatment.      Antonietta Breach, M.D.  Electronically Signed     JW/MEDQ  D:  11/05/2007  T:  11/05/2007  Job:  [id number]

## 2010-08-28 NOTE — H&P (Signed)
NAME:  Ernest [name], Ernest [name] NO.:  [id number]   MEDICAL RECORD NO.:  [id number]         PATIENT TYPE:  BIPS   LOCATION:                                FACILITY:  [facility]   PHYSICIAN:  Anselm Jungling, MD  DATE OF BIRTH:  July 01, 1940   DATE OF ADMISSION:  10/01/2007  DATE OF DISCHARGE:                       PSYCHIATRIC ADMISSION ASSESSMENT   This is a 70 year old male voluntarily admitted on October 01, 2007.   HISTORY OF PRESENT ILLNESS:  The patient presents with a history of  alcohol dependence.  He started drinking immediately after he was  discharged after admission, last week, for alcohol use.  He has been  drinking three to four 40-ounce beers.  He reports that he has had a  couple of falls.  He denies any suicidal thoughts; but states that I  guess if I am drinking, I guess I am trying to hurt myself.  He feels,  at this time, he may need some long-term treatment.   PAST PSYCHIATRIC HISTORY:  The patient, again, has had multiple  admissions for alcohol detox.  He was here approximately 1 week ago.   SOCIAL HISTORY:  A 70 year old male who lives alone.  He is unemployed.   FAMILY HISTORY:  Unknown.   ALCOHOL AND DRUG HISTORY:  The patient smokes cigarettes.  Urine drug  screen is positive for benzodiazepines.   PRIMARY CARE Dorrian Doggett:  Dr. __________   MEDICAL PROBLEMS:  Hypertension, arthritis, and GERD.   MEDICATIONS:  1. Celebrex 200 mg daily.  2. Glipizide 5/500 twice daily.  3. Zantac 150 mg daily.  4. Aspirin 81 mg daily.  5. Exforge 5/160 one daily.  6. The patient also takes over-the-counter supplements.   DRUG ALLERGIES:  No known allergies.   PHYSICAL EXAM:  GENERAL:  The patient was assessed at [facility] in  [facility].  VITAL SIGNS:  His temperature is 97.5, 94 heart rate, 18 respirations,  blood pressure is 114/71, 5 feet 8 inches tall, 203 pounds.   Urinalysis is negative.  Urine drug screen positive for benzodiazepines.  Alcohol level  219, lipase of 24.  Patient, at this time, is resting in  bed.  He appears in no acute distress fully dressed, little eye contact.  No tremors were noted.   MENTAL STATUS EXAM:  The patient is resting, clothed, appropriately  dressed.  Poor eye contact, was polite and appreciated interaction no  delusional thinking.  Cognitive function intact.  He seems aware of  himself and situation.  Judgment is good.  Insight is partial.  Poor  impulse control related to alcohol use.   IMPRESSION:  AXIS I:  Alcohol dependence benzodiazepine abuse.  AXIS II:  Deferred.  AXIS III:  1. Hypertension  2. Gastroesophageal reflux disease.  3. Arthritis.  AXIS IV:  1. Psychosocial problems.  2. Medical problems.  AXIS V:  Current is 45.   PLAN:  To contract for safety.  Will detox with Librium protocol.  Resume his blood pressure medicines, increase fluids.  The patient will  be in the red group.  Celexa was ordered as an antidepressant to aid  with depressive symptoms.  Case manager and interdisciplinary team will  look at a long-term rehab.  The patient remains interested.  Tentative  length of stay is 4-6 days.      Landry Corporal, N.P.      Anselm Jungling, MD  Electronically Signed    JO/MEDQ  D:  10/01/2007  T:  10/01/2007  Job:  [phone]

## 2010-08-28 NOTE — H&P (Signed)
NAME:  Ernest [name], Ernest [name] NO.:  [id number]   MEDICAL RECORD NO.:  [id number]          PATIENT TYPE:  IPS   LOCATION:  0500                          FACILITY:  [facility]   PHYSICIAN:  Geoffery Lyons, M.D.      DATE OF BIRTH:  05/02/1940   DATE OF ADMISSION:  01/29/2007  DATE OF DISCHARGE:                       PSYCHIATRIC ADMISSION ASSESSMENT   IDENTIFYING INFORMATION:  This is a 70 year old widowed male voluntarily  admitted on January 29, 2007.   HISTORY OF PRESENT ILLNESS:  The patient presents with a history of  alcohol dependence.  Relapsed about three weeks ago, drinking five 40-  ounce beers daily with his last drink yesterday.  States he lost his  motivation to exercise every day, getting confused, not sure if he has  been taking his medications.  States he recently moved into a private  home where other people reside that drink socially and he thought I can  go ahead and have one beer and then drinking had escalated.  The  patient states he is here to be detoxed and to get himself feeling  healthy again.   PAST PSYCHIATRIC HISTORY:  Second visit to the [facility].   SOCIAL HISTORY:  This is a 70 year old widowed white male.  He again  moved into a private home with other residents.  States he has his own  room and again other boarders there drink which has led to him  relapsing.   FAMILY HISTORY:  None.   ALCOHOL/DRUG HISTORY:  Denies any other drug use.  Denies any seizure  activity.   PRIMARY CARE PHYSICIAN:  Dr. Ivory Broad at [facility].   MEDICAL PROBLEMS:  Arthritis, gastroesophageal reflux disease, non-  insulin-dependent diabetes mellitus and hypertension.  He reports a fall  about a year ago and sustained some injury to his right knee.   MEDICATIONS:  Ranitidine 150 mg b.i.d., Celebrex 200 mg daily, Valium 5  mg t.i.d. p.r.n., glipizide 5/500 mg b.i.d., Deplin 7.5 daily, Exforge  5/150 mg daily, Lexapro 10 mg daily and  metformin 500 mg b.i.d.   ALLERGIES:  No known allergies.   PHYSICAL EXAMINATION:  This is an elderly male who walks with a cane,  cooperative, no acute distress.  No tremors were noted.  He was fully  assessed at [facility] Emergency Department.  Temperature is 97.2,  heart rate 95, respirations 20, blood pressure 118/67, weight 193 pounds  height 6 feet tall.   LABORATORY DATA:  TSH is 2.491.  CBC within normal limits.  Sodium of  130.  Alcohol level of 91.  Urine drug screen is positive for  benzodiazepines.  Hemoglobin 12.8.   MENTAL STATUS EXAM:  He is alert, cooperative, good eye contact,  casually dressed.  Again, walks with a cane.  Speech is clear, normal  pace and tone.  The patient's mood is feeling guilty.  Affect is  somewhat sad but pleasant and agreeable to treatment plan and  cooperating with the detox protocol.  Thought process with no evidence  of any thought disorder.  Cognitive function intact.  Memory is good.  Judgment and insight  is fair.  He appears sincere.   DIAGNOSES:  AXIS I:  Alcohol dependence.  AXIS II:  Deferred.  AXIS III:  Non-insulin-dependent diabetes mellitus, knee pain,  hypertension and arthritis.  AXIS IV:  Other psychosocial problems, medical problems.  AXIS V:  Current 45-50.   PLAN:  To detox the patient with the Librium protocol.  Work on relapse  prevention.  It was discussed with the patient that we will discontinue  his Valium and the reason for discontinuing his medications.  We will  check his blood sugars twice a day.  Will resume his medications.  The  patient is considered a fall risk.  Will also repeat his electrolytes.  He is to follow up with his family doctor as advised.   TENTATIVE LENGTH OF STAY:  Four to six days.      Landry Corporal, N.P.      Geoffery Lyons, M.D.  Electronically Signed    JO/MEDQ  D:  01/29/2007  T:  01/30/2007  Job:  [id number]
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
## 2010-08-31 NOTE — Discharge Summary (Signed)
NAME:  Ernest [name], GOMBOS NO.:  [id number]   MEDICAL RECORD NO.:  [id number]                   PATIENT TYPE:  IPS   LOCATION:  0305                                 FACILITY:  [facility]   PHYSICIAN:  Jeanice Lim, M.D.              DATE OF BIRTH:  May 18, 1940   DATE OF ADMISSION:  10/08/2003  DATE OF DISCHARGE:  10/12/2003                                 DISCHARGE SUMMARY   IDENTIFYING DATA:  This is a 70 year old widowed Caucasian male found at  curbside depressed with suicidal ideation, complaining of chest pain and  intoxicated.  Evaluated for chest pain and found peptic ulcer.  This pain  resolved.  Had a history of emphysema.  DTs from alcohol and history of  suicidal ideation with positive suicide attempts in the past as well as a  possible CVA.  History of hepatitis C, hypertension and reflux.  The patient  had been given the diagnosis of bipolar disorder in the past, although  questions of him having just unipolar depression, had surfaced recently.  The patient had recently graduated from the [employer], moved into  [employer] and had stopped going to AA before relapsing.  He was  unable to explain benzodiazepines in urine.  Had been at [facility] as well as [facility] in the past and a history of cutting wrist in  2002.   MEDICATIONS:  Nu-Iron, Combivent inhaler, atenolol, Protonix.  Not taking  medications just prior to admission and no psychotropics prior to admission.   ALLERGIES:  No known drug allergies.   PHYSICAL EXAMINATION:  Physical exam and neurologic essentially within  normal limits.   MENTAL STATUS EXAM:  Alert and oriented x 3.  Well-groomed.  Casually  dressed.  Speech not pressured.  Feeling hopeless and helpless.  Passive  suicidal ideation.  Cognition intact.  Judgment and insight fair.   ADMISSION DIAGNOSES:   AXIS I:  1. Major depressive disorder, recurrent, severe.  2. Alcohol dependence.  3.  History of polysubstance abuse.   AXIS II:  Deferred.   AXIS III:  1. Microcytic anemia.  2. Diabetes mellitus.  3. Chronic obstructive pulmonary disease.  4. Coronary artery disease with a history of a cerebrovascular accident on     the left side.   AXIS IV:  Severe (problems with primary support group, medical problems and  long-term alcohol dependence).   AXIS V:  35/55.   HOSPITAL COURSE:  The patient was admitted and ordered routine p.r.n.  medications and underwent further monitoring.  Was encouraged to participate  in individual, group and milieu therapy.  The patient was placed on a detox  protocol and monitored, required p.r.n.'s for breakthrough withdrawal  symptoms.  Was started on lithium after risk/benefit ratio and alternative  treatments were discussed.  The patient agreed to start lithium due to clear  history of mood instability which often results in relapse  and Seroquel to  restore sleep.  The patient tolerated medications well.  Reported  stabilization of mood, resolution of withdrawal symptoms.   CONDITION ON DISCHARGE:  Discharged in improved condition with no dangerous  ideation, no psychotic symptoms.  Euthymic.  Brighter affect.  Improved  judgment and insight.  Motivation to remain abstinent and follow-up with the  aftercare plan.  The patient was given medication education.   DISCHARGE MEDICATIONS:  1. Naphcon-a ophthalmic solution.  2. Lithobid 300 mg at 8 p.m.  3. Protonix 40 mg q.a.m.  4. Tenormin 25 mg, 1/2 q.a.m.  5. Lexapro 10 mg, 1/2 q.a.m.  6. Aspirin 81 mg q.a.m.  7. Trazodone 150 mg q.h.s.  8. Ferrous sulfate 325 mg b.i.d.  9. Combivent inhaler 2 puffs q.6h. p.r.n. tightness.  10.      Ambien 10 mg q.h.s. p.r.n. insomnia.   FOLLOW UP:  The patient to follow up at [facility]  Thursday, October 13, 2003 at 11 a.m.   DISCHARGE DIAGNOSES:   AXIS I:  1. Major depressive disorder, recurrent, severe.  2. Alcohol  dependence.  3. History of polysubstance abuse.   AXIS II:  Deferred.   AXIS III:  1. Microcytic anemia.  2. Diabetes mellitus.  3. Chronic obstructive pulmonary disease.  4. Coronary artery disease with a history of a cerebrovascular accident on     the left side.   AXIS IV:  Severe (problems with primary support group, medical problems and  long-term alcohol dependence).   AXIS V:  Global Assessment of Functioning on discharge 55.                                               Jeanice Lim, M.D.    JEM/MEDQ  D:  11/04/2003  T:  11/05/2003  Job:  [id number]

## 2010-08-31 NOTE — Discharge Summary (Signed)
NAME:  Ernest [name], Ernest [name] NO.:  [id number]   MEDICAL RECORD NO.:  [id number]          PATIENT TYPE:  IPS   LOCATION:  0406                          FACILITY:  [facility]   PHYSICIAN:  Anselm Jungling, MD  DATE OF BIRTH:  1941/01/24   DATE OF ADMISSION:  10/01/2007  DATE OF DISCHARGE:  10/09/2007                               DISCHARGE SUMMARY   IDENTIFYING DATA/REASON FOR ADMISSION:  This was one of many inpatient  admissions for Ernest [name], a 70 year old single white male.  He returned  with increasing depression, suicidal ideation, alcohol abuse and  multiple medical problems.  Please refer to the admission note for  further details pertaining to the symptoms, circumstances and history  that led to his hospitalization.  He was given an initial Axis I  diagnosis of alcohol dependence, alcohol withdrawal and substance-  induced mood disorder.   MEDICAL/LABORATORY:  The patient came to [place] with a history of non-  insulin-dependent diabetes mellitus, hypertension, GERD and arthritis.  He was medically and physically assessed by the psychiatric nurse  practitioner.  He was continued on his usual regimen of Glucophage,  aspirin, Norvasc, Diovan, Pepcid, Celebrex and glucosamine.  There were  no acute medical issues.   HOSPITAL COURSE:  The patient was admitted to the adult inpatient  psychiatric service.  He presented as a well-nourished, normally-  developed male who was disheveled and ill-appearing, and initially  remained in bed.  He was tired, but fully oriented without any signs or  symptoms of psychosis, thought disorder or delirium.  He was moderately  tremulous and complained of nervousness.  His mood was depressed.   He was placed on a Librium withdrawal protocol for alcohol cessation.  He was also treated with Celexa 10 mg daily and at his request,  initiated on Campral 666 mg t.i.d. for alcohol cessation.   His detoxification proceeded uneventfully.  Once he was  feeling better,  he was a good participant in the treatment program, pleasant,  cooperative.  He was able to fully acknowledge his alcoholism and his  need for ongoing sobriety and abstinence.  He worked with case  management towards a discharge plan that would support this overall  goal.  The patient was appropriate for discharge on the ninth hospital  day.   AFTERCARE:  The patient refused any formal followup, but indicated that  he would go to his primary care physician for followup.   DISCHARGE MEDICATIONS:  1. Glucophage 500 mg b.i.d.  2. Aspirin 81 mg daily.  3. Thiamine 100 mg daily.  4. Multivitamin 1 daily.  5. Norvasc 5 mg daily.  6. Diovan 160 mg daily.  7. Celexa 10 mg daily.  8. Campral 666 mg t.i.d.  9. Pepcid 20 mg daily.  10.Celebrex 100 mg t.i.d.  11.Glucosamine 1 tablet daily.   DISCHARGE DIAGNOSES:  AXIS I:  Alcohol dependence, early remission.  AXIS II:  Deferred.  AXIS III:  History of non-insulin-dependent diabetes mellitus,  hypertension, gastroesophageal reflux disease, arthritis.  AXIS IV:  Stressors severe.  AXIS V:  Global assessment of functioning on discharge 50.  Anselm Jungling, MD  Electronically Signed     SPB/MEDQ  D:  10/16/2007  T:  10/16/2007  Job:  [id number]

## 2010-08-31 NOTE — H&P (Signed)
NAME:  Ernest [name], SPRUNG NO.:  [id number]   MEDICAL RECORD NO.:  [id number]                   PATIENT TYPE:  IPS   LOCATION:  0305                                 FACILITY:  [facility]   PHYSICIAN:  Geoffery Lyons, M.D.                   DATE OF BIRTH:  1940/10/11   DATE OF ADMISSION:  10/08/2003  DATE OF DISCHARGE:                         PSYCHIATRIC ADMISSION ASSESSMENT   IDENTIFYING INFORMATION:  This is a voluntary admission.  This is a 70-year-  old white widowed male.  He was found on October 06, 2003 at the curbside.  He  was depressed with suicidal ideation.  He was complaining of chest pain.  He  was also intoxicated.  He was admitted to the main hospital where he was  evaluated for his chest pain.  He was found to have peptic ulcer disease  with a bleed in the past that has resolved, emphysema, non-oxygen dependent,  history of DTs from alcohol withdrawal, history of suicidal ideation and  suicide attempts in the past, CVA as noted in the record, history for  hepatitis C and hypertension and reflux.  The patient states that he was  given a diagnosis of bipolar illness in the past.  However, he was recently  told that that diagnosis was incorrect.  Dr. Jeanie Sewer saw him in consult on  October 08, 2003.  He felt that, at the moment, he has major depressive  disorder, recurrent, severe, alcohol dependence with physical dependence.   The patient reports that he is a recent graduate of the [employer].  He  moved into [place] [employer] and, apparently, some neighbors came over with  a six-pack and that began his alcoholic relapse.  A couple of days ago, he  went with some people down the beach for a [place].  He thought it was going  to be an overnight trip.  However, it lengthened into several days.  During  the course of that visit, he did try cocaine.  He states he had never done  this before but he did try it and that is how come he was found to have  cocaine in his urine.  He is not sure exactly how he came to have  benzodiazepines in his urine.  Today, he is still endorsing depressive  symptoms, actually mostly anxiety.  He states that he gets frustrated very  easily and this increases his anxiety.  He voices the thought that he just  has to not drink.  He realizes that.   PAST PSYCHIATRIC HISTORY:  The records indicate that he has had six prior  admissions at [facility] well as [facility].  He has also had  a suicide attempt by cutting his wrist in 2002.   SOCIAL HISTORY:  He finished college.  He was employed at a [employer] as a  Psychologist, occupational.  He is widowed.  He does have two  grown children, a son age 62, a  daughter age 39.  However, he is not in contact with them.  He believes that  they both reside in [place].   FAMILY HISTORY:  His father was an alcoholic.   ALCOHOL/DRUG HISTORY:  Since his relapse, he was drinking 1-1/2 cases of  beer per day.  He was also smoking two packs of cigarettes per day.   PRIMARY CARE PHYSICIAN:  Waldemar Dickens at [facility].   MEDICAL PROBLEMS:  He is followed for hypertension, GERD, anxiety,  depression and hepatitis C.  He is also apparently status post a CVA with  some left-leg weakness.   MEDICATIONS:  On transfer from the hospital, his medications currently are  atenolol 25 mg, 1/2 p.o. q.d. for blood pressure, Combivent inhaler 2 puffs  q.6h. p.r.n., Nu-Iron 150 mg, 1 p.o. b.i.d. and Protonix 40 mg p.o. q.d.  He  has not been taking psychotropic medications and they will be restarted  during this admission.   ALLERGIES:  No known drug allergies.   POSITIVE PHYSICAL FINDINGS:  Nothing stood out.  Please see enclosed  transfer records from the main hospital.   MENTAL STATUS EXAM:  He is alert and oriented x 3.  He is well-groomed and  casually dressed.  His speech is not pressured.  His mood, he states, is  starting to feel less hopeless.  His affect was appropriate.  His  thought  processes were clear, rational and goal-oriented.  Concentration and memory  are intact.  Judgment and insight are fair.  Intelligence is at least  average.   DIAGNOSES:   AXIS I:  1. Major depressive disorder, recurrent, severe.  2. Alcohol dependence with physical dependence.  3. Polysubstance abuse.   AXIS II:  Deferred.   AXIS III:  1. Microcytic anemia.  2. Possible diabetes mellitus.  3. Chronic obstructive pulmonary disease.  4. Recent coronary artery disease with cerebrovascular accident on the left.   AXIS IV:  Severe (problems with primary support group, medical problems).   AXIS V:  35.   PLAN:  Admit for continued stabilization.  Reestablish psychotropic  medications as indicated.  Have casemanager assess for post-discharge  placement.     Mickie Leonarda Salon, P.A.-C.               Geoffery Lyons, M.D.    MD/MEDQ  D:  10/09/2003  T:  10/09/2003  Job:  [id number]
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
## 2010-08-31 NOTE — Discharge Summary (Signed)
NAME:  ADAR, RASE NO.:  [id number]   MEDICAL RECORD NO.:  [id number]          PATIENT TYPE:  IPS   LOCATION:  0306                          [facility]  [facility]   PHYSICIAN:  Anselm Jungling, MD  DATE OF BIRTH:  1940-09-26   DATE OF ADMISSION:  12/01/2006  DATE OF DISCHARGE:  12/08/2006                               DISCHARGE SUMMARY   IDENTIFYING DATA/REASON FOR ADMISSION:  The patient is a 70 year old  Caucasian male who was admitted to our inpatient psychiatric service for  treatment of alcohol detoxification.  His last drink was the day prior  to admission.  Apparently, he had been involved with various treatment  and services elsewhere, but indicated it didn't do anything for me.  Please refer to the admission note for further details pertaining to the  symptoms, circumstances and history that led to his hospitalization.   INITIAL DIAGNOSTIC IMPRESSION:  He was given initial AXIS I diagnosis of  alcohol dependence, alcohol withdrawal, and substance-induced mood  disorder.   MEDICAL/LABORATORY:  The patient was medically and physically assessed  by the psychiatric nurse practitioner.  He came to [place] with a history of  hypertension, GERD, and non-insulin-dependent diabetes mellitus.  He was  treated for these throughout his stay with aspirin, Exforge, ranitidine,  metformin.   HOSPITAL COURSE:  The patient was admitted to the adult inpatient  psychiatric service.  He presented as a disheveled, tremulous male who  was alert and fully oriented.  There were no signs or symptoms of  psychosis or thought disorder.  His mood and affect were appropriate to  the situation, and there was no suicidal ideation.  He expressed a  strong desire to stop drinking for good.   He was placed on a Librium withdrawal protocol.  We endeavored to  explore available supports.   On the second hospital day, the patient reported that he felt a little  better, his sleep had been  poor and he still could not eat much.  He  was spending most of his time in bed, feeling quite ill.   On the third hospital day, the patient was up, dressed, groomed, and  participating in the milieu.  He remained mildly tremulous.  He had  slept well, and his appetite was improving.  He was ambulating well with  his pain.  He indicated that he wanted to go to an assisted-living  facility, because he did not feel he could control his drinking and  regulate his proper medication taking if living alone.   The patient continued a good participant in the treatment program from  this point forward.  He worked closely with the casemanager towards  identifying an appropriate assisted-living facility.  Eventually, one  was found that was to his liking.  He appeared appropriate for discharge  on the ninth hospital day.  He agreed to the following aftercare plan.   AFTERCARE:  The patient was to follow up with a physician at [facility]  where he was to go to live following discharge.   DISCHARGE MEDICATIONS:  1. Aspirin 81 mg daily.  2.  Exforge 1 tablet q.a.m.  3. Ranitidine 150 mg daily.  4. Multivitamin daily.  5. Lexapro 10 mg daily for depression.  6. Metformin 500 mg b.i.d.   He was instructed to no longer take his usual glipizide.   DISCHARGE DIAGNOSES:  AXIS I:  Alcohol dependence, early remission.  AXIS II:  Deferred.  AXIS III:  History of gastroesophageal reflux disease, hypertension, non-  insulin-dependent diabetes mellitus.  AXIS IV:  Stressors:  Severe.  AXIS V:  GAF on discharge 50.      Anselm Jungling, MD  Electronically Signed     SPB/MEDQ  D:  12/17/2006  T:  12/17/2006  Job:  [id number]

## 2010-08-31 NOTE — H&P (Signed)
NAME:  Ernest [name], SHUMAN NO.:  [id number]   MEDICAL RECORD NO.:  [id number]                   PATIENT TYPE:  IPS   LOCATION:  0503                                 FACILITY:  [facility]   PHYSICIAN:  Geoffery Lyons, M.D.                   DATE OF BIRTH:  [birth year]   DATE OF ADMISSION:  10/19/2003  DATE OF DISCHARGE:                         PSYCHIATRIC ADMISSION ASSESSMENT   IDENTIFYING INFORMATION:  This is a 70 year old white male who is widowed.  This is a voluntary admission.  He says today I just want to die.   HISTORY OF PRESENT ILLNESS:  The patient presented in the emergency  department requesting help with suicidal thoughts and alcohol abuse.  He  complained in the emergency room of having drunk alcohol along with baking  soda in an attempt to kill himself yesterday.  The patient has a long  history of alcohol abuse and says that he has been drinking about a care of  beer every day, with the only period sober when he was in the hospital a few  days ago.  He was discharged from [facility] after a stay from  June 25 to June 29.  He reports no period sober.  He began drinking as soon  as he was discharged.  He got his paper prescriptions at discharge but never  bothered to fill any of his medications.  He says that he is depressed, has  no reason to live, and has thoughts of drinking himself to death.   PAST PSYCHIATRIC HISTORY:  This is the patient's second admission to [facility] with his prior admission being June 25 to June  29.  He has an unconfirmed history of prior admissions to [facility] in the past and a history of 2 prior suicide attempts, one at least  by attempting to cut his wrist in 2002.  He has not been taking any  medications regularly, although he was placed on medications as noted below  at his prior stay.  The patient has been abusing alcohol for the past 25  years and is unable to state  a clear period sober or longest period sober.   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  Has been living in a senior apartment  complex but says that he will not return there and will not elaborate why.  He has brought all his belongings with him, which is just a stack of  clothing today.  He does have 2 children who live out of town, from whom he  is estranged.  He appears to have a poor social support system.  No current  legal charges.   FAMILY HISTORY:  Remarkable for a father who was an  alcoholic.   ALCOHOL AND DRUG HISTORY:  As noted above.  The patient denies any other  type of substance abuse.  He is a heavy smoker.   PAST MEDICAL HISTORY:  The patient is followed by Dr. Luther Parody for his  previous history of GI problems and a previous GI bleed.  He had a prior  history of admission in June on the medicine service, to be treated for  chest pain and myocardial infarction was ruled out.  Medical problems  include history of hepatitis C, ETOH abuse, asthma, spondylosis, chronic  obstructive pulmonary disease.  The patient's past medical history is  remarkable for GERD with a history of bleeding peptic ulcer.  He also  apparently had a stroke at some point in the past, leaving him with some  mild left-sided weakness.  He has a history of iron deficiency anemia and  has previously taken iron supplements.   MEDICATIONS:  The patient has been taking no medications whatsoever since  his prior discharge.  Prior medications when discharged June 29, his Natcon  ophthalmic solution 1-2 drops q.i.d., Lithobid 300 mg 1 q.8 p.m., Protonix  40 mg 1 q.a.m., Tenormin 25 mg, 1/2 tab in the morning, Lexapro 5 mg daily,  ASA 81 mg q.a.m., Combivent inhaler 2 puffs q.6 hours for shortness of  breath, Ambien 10 mg q.h.s. insomnia, ferrous sulfate 215 mg 1  p.o. b.i.d.,  and trazodone 150 mg q q.h.s.   DRUG ALLERGIES:  None.   POSITIVE PHYSICAL FINDINGS:  This is a 6 feet tall male who is 193 pounds.  At the time of admission he was afebrile, pulse 83, respirations 20, blood  pressure 127/87.  He is fully alert, with flushed face.  He is in bed, is  passively cooperative, disheveled, hygiene is adequate.  He is a little bit  restless, does not appear to be diaphoretic.  HEENT:  Head is Normocephalic and atraumatic, with male pattern baldness.  Sclerae is nonicteric.  Ocular tracking is within normal limits, no  nystagmus.  No rhinorrhea.  Oropharynx is noninjected.  Neck is supple, no  thyromegaly.  CARDIOVASCULAR:  S1 and S2 is heard, no clicks, murmurs or gallops.  Apical  pulse is regular.  No extra sounds.  Radial pulse synchronous with apical  pulse.  LUNGS:  Clear to auscultation.  Full physical examination was done by Donnetta Hutching in the emergency department  and is available in the record.  NEUROLOGICALLY:  The patient appears nonfocal.   DIAGNOSTIC STUDIES:  The patient's alcohol level was 271.  Hemoglobin was  11.7 with an MCV of 84.2.  The patient's metabolic panel was remarkable for  potassium of 3.4 and normal liver function.  BUN and creatinine were within  normal limits.   MENTAL STATUS EXAM:  The patient is fully alert, with an anxious affect.  He  is cooperative, with a flushed face and he is complaining of tremulousness  and is complaining of withdrawal symptoms.  Speech is decreased in amount  but normal in pace and tone.  Mood is depressed, irritable.  Thought  processes are logical, positive for suicidal ideation with thoughts of  drinking himself to death, no homicidal thought, no evidence of paranoia or  psychosis.  Cognitively he is intact and oriented x3.  Insight poor, impulse  control and judgment guarded.  Intelligence within normal limits.  No homicidal ideation, no internal stimulation noted.   ADMISSION  DIAGNOSIS:   AXIS I:  1. Ethyl alcohol abuse  and dependence.  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   AXIS II:  Rule out personality disorder not otherwise specified.   AXIS III:  Gastroesophageal reflux disease, hepatitis C, anemia by history  and chronic obstructive pulmonary disease.   AXIS IV:  Severe problems with housing and he will not return to his current  apartment although he declines to say why or give any kind of explanation.   AXIS V:  Current 28 past year 79.   INITIAL PLAN OF CARE:  Voluntarily admit the patient with q.15 minute checks  in place with a goal of a safe detox in 5 days, and to alleviate suicidal  ideation.  We are going to restart his previous medications at this point,  hydrate him with Gatorade, start him on a Librium protocol with IM 1000 mg  of thiamine and a 25 mg loading dose and we will monitor is lithium levels  and recheck a BMET on him after he has had some potassium and Gatorade.   ESTIMATED LENGTH OF STAY:  5 days.     Margaret A. Scott, N.P.                   Geoffery Lyons, M.D.    MAS/MEDQ  D:  10/19/2003  T:  10/19/2003  Job:  [id number]

## 2010-08-31 NOTE — Discharge Summary (Signed)
NAME:  Ernest [name], Ernest [name] NO.:  [id number]   MEDICAL RECORD NO.:  [id number]                   PATIENT TYPE:  IPS   LOCATION:  0503                                 FACILITY:  [facility]   PHYSICIAN:  Geoffery Lyons, M.D.                   DATE OF BIRTH:  06-21-1940   DATE OF ADMISSION:  10/19/2003  DATE OF DISCHARGE:  10/24/2003                                 DISCHARGE SUMMARY   CHIEF COMPLAINT AND PRESENT ILLNESS:  This was the second admission to [facility] for this 70 year old white male, widowed,  voluntarily admitted.  He requested help due to suicidal thoughts and his  alcohol use, complaining of drinking alcohol the day prior along with baking  soda in an attempt to kill himself.  He had a long history of alcohol  dependence since age 31.  Longest period cannot be determined.  He was last  detoxified June 29.  He began drinking as soon as he was discharged.  He  never got his prescription filled.  He endorsed depressed mood, suicidal  ideas, thoughts of trying to drink himself to death.   PAST PSYCHIATRIC HISTORY:  This was the second time at [facility].  He had history of one prior admission to [facility] and  several prior detoxifications.   SUBSTANCE ABUSE HISTORY:  Drinking about a case of beer every day for the  past six weeks.  Last period sober was the week he was hospitalized at the  end of June.   PAST MEDICAL HISTORY:  1. Hepatitis C.  2. Asthma.  3. Spondylosis.  4. COPD.   MEDICATIONS:  He had not taken anything since he was discharged and he was  discharged on:  1. Ophthalmic solution one to two drops four times a day.  2. Lithobid 300 mg at 8 p.m.  3. Protonix 40 mg daily.  4. Tenormin 25 mg one half in the morning.  5. Lexapro 10 mg one half in the morning.  6. Aspirin.  7. Trazodone 150 mg at night.  8. Ferrous sulfate 325 mg twice a day.  9. Combivent inhaler two puffs every  six hours as needed.  10.      Ambien 10 mg at bedtime for sleep.   PHYSICAL EXAMINATION:  Physical examination was performed, failed to show  any acute findings.   LABORATORY DATA:  Blood chemistries: Glucose 129.  TSH 0.866.  Lithium 0.28.  Mean corpuscular volume 84.2, hemoglobin 11.7.   MENTAL STATUS EXAM:  Mental status exam revealed a fully alert male,  anxious, passively cooperative, initially quite resistant but refused to get  out of bed, flushed face, complained of feeling nauseous and shaky inside.  No obvious tremors noted.  Motor appeared generally grossly normal.  Speech:  Decreased in amount but normal in tone and pace.  Mood: Depressed,  irritable.  Thought processes were logical; positive for suicidal thoughts,  desire to drink himself to death.  Cognitive: Cognition was well preserved.   ADMISSION DIAGNOSES:   AXIS I:  1. Alcohol dependence.  2. Mood disorder, not otherwise specified.   AXIS II:  No diagnosis.   AXIS III:  1. Gastroesophageal reflux disease.  2. Anemia.  3. Asthma.  4. Chronic obstructive pulmonary disease.  5. Peptic ulcer disease.   AXIS IV:  Moderate.   AXIS V:  Global assessment of functioning upon admission 28, highest global  assessment of functioning in the last year 68.   HOSPITAL COURSE:  He was admitted and started in intensive individual and  group psychotherapy.  He was detoxified with Librium.  He was placed back on  his medications.  Potassium was replaced.  He was placed back on lithium.  He was endorsing that he relapsed almost immediately after he left the last  time.  He was wanting to go into a residential treatment center.  We pursued  the detoxification.  He was wanting to go to [facility].  We were trying to  arrange his being transferred but it did not work out.  On July 11, he was  fully detoxified.  He would go to seek abstinence.  He was going to the  pastor and the pastor was going to help him to go to a 28-day  program.  We  continued to work on a relapse prevention plan.  He endorsed no suicidal  ideas, no homicidal ideas.  He was fully detoxified so we went ahead and  discharged.   DISCHARGE DIAGNOSES:   AXIS I:  1. Alcohol dependence.  2. Mood disorder, not otherwise specified.   AXIS II:  No diagnosis.   AXIS III:  1. Gastroesophageal reflux disease.  2. Anemia.  3. Asthma.   AXIS IV:  Moderate.   AXIS V:  Global assessment of functioning upon discharge 50-55.   DISCHARGE MEDICATIONS:  1. Lithium 450 mg one at night.  2. Trazodone 150 mg at night.  3. Lexapro 10 mg one half every day.  4. Ferrous sulfate 325 mg one tablet twice a day.  5. Combivent inhaler two puffs four times as needed.  6. Protonix 40 mg daily.  7. Aspirin 81 mg daily.  8. Tenormin 25 mg one half tablet every day.   FOLLOW UP:  He was to follow up with Dr. Lang Snow at [facility].                                               Geoffery Lyons, M.D.    IL/MEDQ  D:  11/15/2003  T:  11/16/2003  Job:  [id number]

## 2010-08-31 NOTE — Discharge Summary (Signed)
NAME:  Ernest [name], Ernest [name]            ACCOUNT NO.:  [id number]   MEDICAL RECORD NO.:  [id number]          PATIENT TYPE:  INP   LOCATION:  1504                         FACILITY:  [facility]   PHYSICIAN:  Michelene Gardener, MD    DATE OF BIRTH:  1941/03/16   DATE OF ADMISSION:  11/05/2007  DATE OF DISCHARGE:  11/13/2007                               DISCHARGE SUMMARY   Initial Discharge Summary was done on November 08, 2007.   DISCHARGE DIAGNOSES:  1. Acute alcoholic intoxication.  2. Chronic alcohol abuse.  3. Alcoholic withdrawal syndrome.  4. Depression and suicide ideation.  5. Hypertension.  6. Osteoarthritis.  7. Type 2 diabetes mellitus.   DISCHARGE MEDICATIONS/NEW MEDICATIONS:  1. Trazodone 25 mg at bedtime.  2. Celexa 20 mg once a day.   OLD MEDICATIONS:  1. Multivitamin 1 tab p.o. once a day.  2. Thiamine 100 mg once a day.  3. Visine eye drops 0.05% solution 1 drop to each eye as needed.  4. Exforge 5/160 once a day.  5. Aspirin 81 mg once a day.  6. Protonix 40 mg once a day.  7. Glipizide/Metformin 5/500 mg twice daily.  8. Fish oil 1000 mg once a day.   CONSULTATIONS:  The patient continued to be followed by [name] in the hospital.   RADIOLOGY STUDIES:  No more procedures were done since last Discharge  Summary.   COURSE OF HOSPITALIZATION:  This patient remains stable since last  Discharge Summary.  The only active medical management was regarding his  sodium.  Sodium was 128 on July 28th.  When I saw him he was on fluid  restriction.  I discontinued fluid restriction, and I was following his  sodium day by day.  This continued to improve.  At the time of discharge  it came back to 134.  The patient continued to be seen by [name] who made some change in his medications that includes  trazodone.  Trazodone was added to help with his insomnia, and that has  been given at 25 mg once a day.  I discontinued his Ativan since he is  out of  withdrawal.  Celexa was also added by psychiatry, and that has  been continued.   Care management has been working on his placement, and he was already  accepted some place.  There he still needed to complete his Medicare  switch, and that will take some time.  The patient chose to go to his  home and to follow this process as an outpatient.  That will take a few  days before he will go to the assisted living facility.  That has been  explained in detail to him by the care manager, and he is in total  agreement for that.  He will be discharged home on the above-mentioned  medications.   Total assessment time is 40 minutes.      Michelene Gardener, MD  Electronically Signed     NAE/MEDQ  D:  11/14/2007  T:  11/14/2007  Job:  [id number]

## 2010-08-31 NOTE — Consult Note (Signed)
NAME:  Ernest [name], Ernest [name]                        ACCOUNT NO.:  [id number]   MEDICAL RECORD NO.:  [id number]                   PATIENT TYPE:  INP   LOCATION:  3737                                 FACILITY:  [facility]   PHYSICIAN:  Althea Grimmer. Luther Parody, M.D.            DATE OF BIRTH:  October 14, 1940   DATE OF CONSULTATION:  DATE OF DISCHARGE:                                   CONSULTATION   DATE OF CONSULTATION:  October 07, 2003.   Ernest [name] is a 70 year old male polysubstance abuser, whom I am asked to  evaluate for iron deficiency anemia.  He was brought to the hospital  complaining of chest pain with dry heaves.  It was decided that the chest  pain was noncardiac and probably pleuritic.  He has a longstanding history  of reflux for which he takes Protonix.  He has never had upper or lower  endoscopy.  He was discovered to have a hemoglobin of 10.9, with an MCV of  82.4.  His prothrombin time is normal.  His iron is 20, with a TIBC of 315  for a 6% saturation.  His ferritin is 9.  Liver function tests are normal.  Bilirubin is 0.6, albumin 3.  He reports never having had an upper endoscopy  or colorectal cancer screening.  He says he does not take significant  amounts of aspirin or nonsteroidals, and he denies ongoing GI symptoms at  this time; specifically, he has no dysphagia, epigastric or lower abdominal  pain, weight loss, melena, or hematochezia.   PAST MEDICAL HISTORY:  1. Polysubstance abuse.  2. A reported history of hepatitis C.  3. Emphysema.  4. Tobacco addiction.  5. Reported elevated blood sugar, though in the hospital it has been normal.   CURRENT MEDICATIONS:  Protonix, Albuterol nebulizers, and Atenolol.   FAMILY HISTORY:  Noncontributory.   SOCIAL HISTORY:  The patient is very depressed and reportedly bipolar with  suicidal ideation.  He is struggling with his wife's death 2 years ago.  He  smokes at least a pack of cigarettes per day and drinks 40 ounces of beer  daily.   REVIEW OF SYSTEMS:  GENERAL:  No weight loss or night sweats.  ENDOCRINE:  No history of thyroid problems.  Reportedly elevated blood sugars in the  past.  SKIN:  No rash or pruritus.  EYES:  No icterus or change in vision.  ENT:  No aphthous, arteriosclerosis, or chronic sore throat.  RESPIRATORY:  Positive for exertional dyspnea and a history of emphysema.  CARDIAC:  No  known cardiac disease, though he did enter with pleuritic chest pain.  GI:  As above.  GU:  No dysuria or hematuria.  The remainder of the review of  systems is negative.   PHYSICAL EXAMINATION:  GENERAL:  He is a well-developed, mildly overweight,  adult male in no acute distress.  VITAL SIGNS:  Afebrile.  Blood pressure 120/68, pulse 72  and regular.  SKIN:  Normal.  HEENT:  Eyes anicteric.  Oropharynx unremarkable.  NECK:  Supple without thyromegaly.  There is no cervical or inguinal  adenopathy.  CHEST:  He has a few scattered rhonchi.  HEART:  Heart sounds distant.  Regular rate and rhythm.  ABDOMEN:  Mildly obese and soft without mass, tenderness, or organomegaly.  There is no rebound or hernia.  RECTAL:  Exam is deferred for the contemplated procedure below.  EXTREMITIES:  An abrasion over his left knee.   IMPRESSION:  [age]-year old male polysubstance abuser with significant  iron deficiency anemia.  Upper and lower endoscopies are indicated to rule  out peptic or neoplastic problems and to clear him for probable further  psychiatric admission.   PLAN:  Upper and lower endoscopy.  I reviewed with the patient in terms of  technique, preparation, and risk of complications including bleeding and  perforation.  He agrees to proceed and that will be scheduled for in the  morning following Phospho-Soda and GoLYTELY prep.  Please see the orders.                                               Althea Grimmer. Luther Parody, M.D.    PJS/MEDQ  D:  10/07/2003  T:  10/07/2003  Job:  [id number]
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
## 2010-08-31 NOTE — H&P (Signed)
NAME:  Ernest [name], Ernest [name] NO.:  [id number]   MEDICAL RECORD NO.:  [id number]                   PATIENT TYPE:  IPS   LOCATION:  0503                                 FACILITY:  [facility]   PHYSICIAN:  Geoffery Lyons, M.D.                   DATE OF BIRTH:  December 31, 1940   DATE OF ADMISSION:  10/19/2003  DATE OF DISCHARGE:                         PSYCHIATRIC ADMISSION ASSESSMENT   IDENTIFYING INFORMATION:  This is a 70 year old white male who is widowed.  This is a voluntary admission.   HISTORY OF PRESENT ILLNESS:  This patient presented in the emergency  department requesting help with suicidal thoughts and his alcohol abuse.  He  complains of drinking alcohol the day prior, along with baking soda in an  attempt to kill himself.  He has a long history of alcohol abuse since age  13 and longest period clear he cannot say.  He was last here for alcohol  detox June 29 and he states after he left here he began drinking as soon as  he was discharged and never got his prescriptions filled.  He endorses  depressed mood with suicidal ideation with thoughts of trying to drink  himself to death, possibly take too much medication.  He feels that he needs  long-term treatment program and cannot stay away from alcohol.  Denies any  auditory or visual hallucinations or homicidal thoughts.   PAST PSYCHIATRIC HISTORY:  This is the patient's second admission to  [facility].  He has a history of at least one prior admission  to [facility] and also history of several prior detoxes.  Has a  history of a suicide attempt by cutting his wrist in 2002.   SOCIAL HISTORY:  The patient is a Insurance underwriter.  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  He lives in his own apartment but states  he will not return there and will not say why.  He is estranged from 2 grown  children who live out of town.   FAMILY HISTORY:  Remarkable for father who was an alcoholic.   ALCOHOL AND DRUG HISTORY:  The patient has currently been drinking about a  case of beer every day for the past 6 weeks at least.  Last period sober was  the week that he was hospitalized here at the end of June.  Longest period  sober is not clear.  Denies any other substance abuse.   PAST MEDICAL HISTORY:  The patient has been followed by Dr. Altamese Dilling for GI  problems.  Medical problems include hepatitis C, alcohol abuse, asthma,  spondylosis and COPD.  The patient also has a past history of a CVA that  left him with some mild left-sided weakness, iron deficiency anemia by  history, GERD and a history of  a peptic ulcer.   MEDICATIONS:  The patient has not taken any medications since he was last  discharged.  Previous medications as follows.  The patient was discharged  June 29 after a 4-day stay here at [facility] on the following  medications:  Natcon ophthalmic solution 1-2 drops q.i.d. each eye, Lithobid  300 mg p.o. q.8 p.m., Protonix 40 mg 1 daily, Tenormin 25 mg 1/2 tab q.a.m.,  Lexapro 10 mg 1/2 tab q.a.m., ASA 82 mg daily, trazodone 150 mg p.o. q.h.s.,  ferrous sulfate 325 mg 1 tab b.i.d., Combivent inhaler 2 puffs q.6 hours  p.r.n. for COPD, Ambien 10 mg one at bedtime as needed for sleep.  He was  referred to [facility] at that time.  The patient did not  fill any of these medications and states he has taken nothing since he left.   DRUG ALLERGIES:  None.   REVIEW OF SYSTEMS:  Reveals that the patient does endorse feeling shaky  inside, nauseated, but has not vomited at all.  Complains of epigastric  burning without any acute pain, has been able to keep fluids down.   POSITIVE PHYSICAL FINDINGS:  Full physical examination was done in the  emergency room by Dr. Donnetta Hutching.  Physical examination reveals a well-  nourished, well-developed male who  appears to be his stated age.  He is 6  feet tall, 193 pounds, alert, with a flushed face.  Sclerae are nonicteric.  Vital signs reveal he is afebrile.  Pulse 83, respirations 20, blood  pressure 127/87 at this time.  Neuro is generally nonfocal.  Please see the  full exam as noted in the record.  Alcohol level in the emergency department  was 271 mg/dl.   DIAGNOSTIC STUDIES:  Metabolic panel revealed normal liver function.  His  potassium was very slightly decreased at 3.4 and he is keeping food and  fluids down.  Hemoglobin was 11.7 and MCV 84.2.  CBC otherwise unremarkable.   MENTAL STATUS EXAM:  The patient is fully alert, with an anxious affect.  He  is passively cooperative, initially quite resistant, does warm some with  conversation but refuses to get out of bed.  He has a flushed face and  complains subjectively of feeling nauseated and shaky inside.  No obvious  tremor is noted.  Motor generally appears grossly normal.  Speech is  decreased in amount but normal in tone and pace.  Mood is depressed,  irritable.  Thought process is logical, positive for suicidal thoughts with  a desire to drink himself to death.  Cognitively he is intact and oriented  x3.  No homicidal thought, no internal distractions are noted.  His thoughts  are goal oriented.  No homicidal ideation or evidence of auditory or visual  hallucinations or psychosis.  Short and long term memory are intact.  Impulse control and judgment within normal limits.  Insight poor.  Intelligence within normal limits.   ADMISSION DIAGNOSIS:   AXIS I:  1. Ethyl alcohol abuse and dependence.  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   AXIS II:  Rule out personality disorder not otherwise specified.   AXIS III:  Gastroesophageal reflux disease, anemia by history, asthma,  chronic obstructive pulmonary disease, history of peptic ulcer.  AXIS IV:  Severe problems with housing and will not return to his current   apartment.   AXIS V:  Current 26, past year 15.   INITIAL PLAN OF CARE:  To voluntarily admit the  patient with a goal of a  safe detox in 5 days and to alleviate his suicidal thought.  We are going to  start him on a Librium protocol which so far he is tolerating well.  We will  recheck a lithium level on him here, hydrate him with Gatorade and we will  restart his previous medications.   ESTIMATED LENGTH OF STAY:  5 days.     Margaret A. Scott, N.P.                   Geoffery Lyons, M.D.    MAS/MEDQ  D:  10/20/2003  T:  10/21/2003  Job:  [id number]
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
## 2010-09-14 ENCOUNTER — Emergency Department: Payer: Self-pay | Admitting: Unknown Physician Specialty

## 2010-09-17 ENCOUNTER — Emergency Department: Payer: Self-pay | Admitting: Emergency Medicine

## 2010-09-18 ENCOUNTER — Emergency Department: Payer: Self-pay | Admitting: Emergency Medicine

## 2010-09-27 ENCOUNTER — Emergency Department: Payer: Self-pay | Admitting: Emergency Medicine

## 2010-09-28 ENCOUNTER — Emergency Department: Payer: Self-pay | Admitting: Emergency Medicine

## 2010-09-30 ENCOUNTER — Emergency Department: Payer: Self-pay | Admitting: Internal Medicine

## 2010-10-08 ENCOUNTER — Emergency Department: Payer: Self-pay | Admitting: Emergency Medicine

## 2010-10-09 ENCOUNTER — Emergency Department: Payer: Self-pay | Admitting: Emergency Medicine

## 2010-10-19 ENCOUNTER — Emergency Department (HOSPITAL_COMMUNITY)
Admission: EM | Admit: 2010-10-19 | Discharge: 2010-10-21 | Disposition: A | Discharge status: Home / Self Care | Payer: Medicare Other | Attending: Emergency Medicine | Admitting: Emergency Medicine

## 2010-10-19 ENCOUNTER — Emergency Department (HOSPITAL_COMMUNITY)
Admission: EM | Admit: 2010-10-19 | Discharge: 2010-10-19 | Discharge status: Against Medical Advice | Payer: Medicare Other | Attending: Emergency Medicine | Admitting: Emergency Medicine

## 2010-10-19 DIAGNOSIS — F329 Major depressive disorder, single episode, unspecified: Secondary | ICD-10-CM | POA: Insufficient documentation

## 2010-10-19 DIAGNOSIS — Z5900 Homelessness unspecified: Secondary | ICD-10-CM | POA: Insufficient documentation

## 2010-10-19 DIAGNOSIS — F1010 Alcohol abuse, uncomplicated: Secondary | ICD-10-CM | POA: Insufficient documentation

## 2010-10-19 DIAGNOSIS — G8929 Other chronic pain: Secondary | ICD-10-CM | POA: Insufficient documentation

## 2010-10-19 DIAGNOSIS — Z765 Malingerer [conscious simulation]: Secondary | ICD-10-CM | POA: Insufficient documentation

## 2010-10-19 DIAGNOSIS — E119 Type 2 diabetes mellitus without complications: Secondary | ICD-10-CM | POA: Insufficient documentation

## 2010-10-19 DIAGNOSIS — F3289 Other specified depressive episodes: Secondary | ICD-10-CM | POA: Insufficient documentation

## 2010-10-19 LAB — COMPREHENSIVE METABOLIC PANEL
AST: 19 U/L (ref 0–37)
Albumin: 4 g/dL (ref 3.5–5.2)
Alkaline Phosphatase: 59 U/L (ref 39–117)
Chloride: 91 mEq/L — ABNORMAL LOW (ref 96–112)
Creatinine, Ser: 0.74 mg/dL (ref 0.50–1.35)
Potassium: 3.3 mEq/L — ABNORMAL LOW (ref 3.5–5.1)
Sodium: 126 mEq/L — ABNORMAL LOW (ref 135–145)
Total Bilirubin: 0.2 mg/dL — ABNORMAL LOW (ref 0.3–1.2)

## 2010-10-19 LAB — CBC
Platelets: 304 10*3/uL (ref 150–400)
RDW: 15.4 % (ref 11.5–15.5)
WBC: 8 10*3/uL (ref 4.0–10.5)

## 2010-10-19 LAB — DIFFERENTIAL
Basophils Absolute: 0 10*3/uL (ref 0.0–0.1)
Basophils Relative: 0 % (ref 0–1)
Eosinophils Absolute: 0.2 10*3/uL (ref 0.0–0.7)
Eosinophils Relative: 3 % (ref 0–5)

## 2010-10-21 ENCOUNTER — Emergency Department (HOSPITAL_COMMUNITY)
Admission: EM | Admit: 2010-10-21 | Discharge: 2010-10-21 | Payer: Medicare Other | Attending: Emergency Medicine | Admitting: Emergency Medicine

## 2010-10-21 DIAGNOSIS — E119 Type 2 diabetes mellitus without complications: Secondary | ICD-10-CM | POA: Insufficient documentation

## 2010-10-21 DIAGNOSIS — F1010 Alcohol abuse, uncomplicated: Secondary | ICD-10-CM | POA: Insufficient documentation

## 2010-10-21 DIAGNOSIS — I10 Essential (primary) hypertension: Secondary | ICD-10-CM | POA: Insufficient documentation

## 2010-12-12 ENCOUNTER — Inpatient Hospital Stay (HOSPITAL_COMMUNITY)
Admission: EM | Admit: 2010-12-12 | Discharge: 2010-12-14 | DRG: 641 | Disposition: A | Discharge status: Home / Self Care | Payer: Medicare Other | Attending: Internal Medicine | Admitting: Internal Medicine

## 2010-12-12 DIAGNOSIS — M545 Low back pain, unspecified: Secondary | ICD-10-CM | POA: Diagnosis present

## 2010-12-12 DIAGNOSIS — Z91199 Patient's noncompliance with other medical treatment and regimen due to unspecified reason: Secondary | ICD-10-CM

## 2010-12-12 DIAGNOSIS — I10 Essential (primary) hypertension: Secondary | ICD-10-CM | POA: Diagnosis present

## 2010-12-12 DIAGNOSIS — G894 Chronic pain syndrome: Secondary | ICD-10-CM | POA: Diagnosis present

## 2010-12-12 DIAGNOSIS — F10229 Alcohol dependence with intoxication, unspecified: Secondary | ICD-10-CM | POA: Diagnosis present

## 2010-12-12 DIAGNOSIS — Z5900 Homelessness unspecified: Secondary | ICD-10-CM

## 2010-12-12 DIAGNOSIS — F17200 Nicotine dependence, unspecified, uncomplicated: Secondary | ICD-10-CM | POA: Diagnosis present

## 2010-12-12 DIAGNOSIS — F329 Major depressive disorder, single episode, unspecified: Secondary | ICD-10-CM | POA: Diagnosis present

## 2010-12-12 DIAGNOSIS — F3289 Other specified depressive episodes: Secondary | ICD-10-CM | POA: Diagnosis present

## 2010-12-12 DIAGNOSIS — E1149 Type 2 diabetes mellitus with other diabetic neurological complication: Secondary | ICD-10-CM | POA: Diagnosis present

## 2010-12-12 DIAGNOSIS — Z9119 Patient's noncompliance with other medical treatment and regimen: Secondary | ICD-10-CM

## 2010-12-12 DIAGNOSIS — E871 Hypo-osmolality and hyponatremia: Principal | ICD-10-CM | POA: Diagnosis present

## 2010-12-12 DIAGNOSIS — E785 Hyperlipidemia, unspecified: Secondary | ICD-10-CM | POA: Diagnosis present

## 2010-12-12 DIAGNOSIS — E1142 Type 2 diabetes mellitus with diabetic polyneuropathy: Secondary | ICD-10-CM | POA: Diagnosis present

## 2010-12-12 DIAGNOSIS — M1710 Unilateral primary osteoarthritis, unspecified knee: Secondary | ICD-10-CM | POA: Diagnosis present

## 2010-12-12 DIAGNOSIS — J4489 Other specified chronic obstructive pulmonary disease: Secondary | ICD-10-CM | POA: Diagnosis present

## 2010-12-12 DIAGNOSIS — M25569 Pain in unspecified knee: Secondary | ICD-10-CM | POA: Diagnosis present

## 2010-12-12 DIAGNOSIS — J449 Chronic obstructive pulmonary disease, unspecified: Secondary | ICD-10-CM | POA: Diagnosis present

## 2010-12-12 LAB — CBC
HCT: 33.6 % — ABNORMAL LOW (ref 39.0–52.0)
Hemoglobin: 11.2 g/dL — ABNORMAL LOW (ref 13.0–17.0)
MCH: 26.7 pg (ref 26.0–34.0)
MCHC: 33.3 g/dL (ref 30.0–36.0)
MCV: 80 fL (ref 78.0–100.0)

## 2010-12-12 LAB — COMPREHENSIVE METABOLIC PANEL
Alkaline Phosphatase: 50 U/L (ref 39–117)
BUN: 14 mg/dL (ref 6–23)
Calcium: 9.1 mg/dL (ref 8.4–10.5)
GFR calc Af Amer: >60 mL/min (ref >60)
Glucose, Bld: 146 mg/dL — ABNORMAL HIGH (ref 70–99)
Total Protein: 6.9 g/dL (ref 6.0–8.3)

## 2010-12-12 LAB — ETHANOL: Alcohol, Ethyl (B): 218 mg/dL — ABNORMAL HIGH (ref 0–11)

## 2010-12-12 LAB — DIFFERENTIAL
Lymphocytes Relative: 22 % (ref 12–46)
Monocytes Absolute: 0.7 10*3/uL (ref 0.1–1.0)
Monocytes Relative: 6 % (ref 3–12)
Neutro Abs: 8.1 10*3/uL — ABNORMAL HIGH (ref 1.7–7.7)

## 2010-12-13 ENCOUNTER — Inpatient Hospital Stay (HOSPITAL_COMMUNITY): Payer: Medicare Other

## 2010-12-13 LAB — TSH: TSH: 3.224 u[IU]/mL (ref 0.350–4.500)

## 2010-12-13 LAB — BASIC METABOLIC PANEL
Chloride: 101 mEq/L (ref 96–112)
GFR calc Af Amer: >60 mL/min (ref >60)
GFR calc non Af Amer: >60 mL/min (ref >60)
Potassium: 4.4 mEq/L (ref 3.5–5.1)
Sodium: 137 mEq/L (ref 135–145)

## 2010-12-13 LAB — COMPREHENSIVE METABOLIC PANEL
ALT: 27 U/L (ref 0–53)
AST: 19 U/L (ref 0–37)
Albumin: 3.5 g/dL (ref 3.5–5.2)
CO2: 23 mEq/L (ref 19–32)
Chloride: 99 mEq/L (ref 96–112)
GFR calc non Af Amer: >60 mL/min (ref >60)
Sodium: 136 mEq/L (ref 135–145)
Total Bilirubin: 0.2 mg/dL — ABNORMAL LOW (ref 0.3–1.2)

## 2010-12-13 LAB — DIFFERENTIAL
Basophils Absolute: 0 10*3/uL (ref 0.0–0.1)
Basophils Relative: 0 % (ref 0–1)
Eosinophils Absolute: 0.2 10*3/uL (ref 0.0–0.7)
Eosinophils Relative: 4 % (ref 0–5)
Lymphocytes Relative: 27 % (ref 12–46)

## 2010-12-13 LAB — PROTIME-INR
INR: 0.98 (ref 0.00–1.49)
Prothrombin Time: 13.2 seconds (ref 11.6–15.2)

## 2010-12-13 LAB — RAPID URINE DRUG SCREEN, HOSP PERFORMED
Benzodiazepines: NOT DETECTED
Cocaine: NOT DETECTED

## 2010-12-13 LAB — URINALYSIS, ROUTINE W REFLEX MICROSCOPIC
Glucose, UA: 500 mg/dL — AB
Ketones, ur: NEGATIVE mg/dL
pH: 6 (ref 5.0–8.0)

## 2010-12-13 LAB — CBC
HCT: 35.7 % — ABNORMAL LOW (ref 39.0–52.0)
Platelets: 297 10*3/uL (ref 150–400)
RDW: 16 % — ABNORMAL HIGH (ref 11.5–15.5)
WBC: 6.9 10*3/uL (ref 4.0–10.5)

## 2010-12-13 LAB — HEMOGLOBIN A1C
Hgb A1c MFr Bld: 7.5 % — ABNORMAL HIGH (ref <5.7)
Mean Plasma Glucose: 169 mg/dL — ABNORMAL HIGH (ref <117)

## 2010-12-13 LAB — SODIUM, URINE, RANDOM: Sodium, Ur: 16 mEq/L

## 2010-12-13 LAB — URINE MICROSCOPIC-ADD ON

## 2010-12-13 LAB — GLUCOSE, CAPILLARY
Glucose-Capillary: 206 mg/dL — ABNORMAL HIGH (ref 70–99)
Glucose-Capillary: 254 mg/dL — ABNORMAL HIGH (ref 70–99)
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
## 2010-12-14 DIAGNOSIS — F331 Major depressive disorder, recurrent, moderate: Secondary | ICD-10-CM

## 2010-12-14 LAB — BASIC METABOLIC PANEL
CO2: 28 mEq/L (ref 19–32)
Chloride: 103 mEq/L (ref 96–112)
Creatinine, Ser: 0.81 mg/dL (ref 0.50–1.35)
GFR calc Af Amer: >60 mL/min (ref >60)
Potassium: 4.1 mEq/L (ref 3.5–5.1)

## 2010-12-14 LAB — CBC
HCT: 33.1 % — ABNORMAL LOW (ref 39.0–52.0)
MCV: 81.1 fL (ref 78.0–100.0)
Platelets: 288 10*3/uL (ref 150–400)
RBC: 4.08 MIL/uL — ABNORMAL LOW (ref 4.22–5.81)
WBC: 7.4 10*3/uL (ref 4.0–10.5)

## 2010-12-14 LAB — DIFFERENTIAL
Basophils Absolute: 0 10*3/uL (ref 0.0–0.1)
Eosinophils Absolute: 0.4 10*3/uL (ref 0.0–0.7)
Lymphocytes Relative: 35 % (ref 12–46)
Lymphs Abs: 2.6 10*3/uL (ref 0.7–4.0)
Neutrophils Relative %: 49 % (ref 43–77)

## 2010-12-14 LAB — OSMOLALITY: Osmolality: 285 mOsm/kg (ref 275–300)

## 2010-12-14 LAB — GLUCOSE, CAPILLARY: Glucose-Capillary: 190 mg/dL — ABNORMAL HIGH (ref 70–99)

## 2010-12-17 NOTE — Discharge Summary (Signed)
NAME:  Ernest [name], Ernest [name] NO.:  [id number]  MEDICAL RECORD NO.:  [id number]  LOCATION:  1537                         FACILITY:  [facility]  PHYSICIAN:  Jeoffrey Massed, MD    DATE OF BIRTH:  Feb 25, 1941  DATE OF ADMISSION:  12/12/2010 DATE OF DISCHARGE:  12/14/2010                        DISCHARGE SUMMARY - REFERRING   PRIMARY DISCHARGE DIAGNOSES: 1. 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  This has now resolved. 2. EtOH abuse with no signs of alcohol withdrawal. 3. Depression, seen by Psychiatry and cleared for discharge.  PAST MEDICAL HISTORY/SECONDARY DISCHARGE DIAGNOSES: 1. History of diabetes, noncompliant to medications. 2. History of hypertension, noncompliant to medications. 3. Chronic pain syndrome from chronic low back pain and right knee     pain. 4. History of COPD. 5. History of ongoing tobacco abuse. 6. History of peripheral neuropathy. 7. History of dyslipidemia.  DISCHARGE MEDICATIONS: 1. Spiriva 18 mcg 1 capsule inhaled daily. 2. Neurontin 300 mg 1 tablet twice daily. 3. Norco 5/500 mg 1 tablet p.o. q.6 h. p.r.n. 4. Trazodone 50 mg 1 tablet p.o. daily at bedtime as needed. 5. Aleve 220 mg 1 tablet p.o. q.8 h. p.r.n. 6. Cymbalta 60 mg 1 capsule p.o. daily. 7. Folic acid 1 mg 1 tablet p.o. daily. 8. Glipizide 5 mg 1 tablet p.o. daily. 9. Lisinopril 20 mg 1 tablet p.o. daily. 10.Magnesium oxide 400 mg 1 tablet p.o. daily. 11.Metformin 500 mg 1 tablet p.o. twice daily. 12.Multivitamins 1 tablet p.o. daily. 13.Omega-3 fatty acids 1 tablet p.o. daily. 14.Protonix 40 mg 1 tablet p.o. daily. 15.Vitamin B1, 100 mg 1 tablet p.o. daily.  CONSULTANTS ON THE CASE:  None.  BRIEF HISTORY OF PRESENT ILLNESS:  The patient is an unfortunate 70 year old homeless [place] veteran who is an alcoholic with noncompliance to medications, with a past medical history of diabetes, hypertension, and chronic pain syndrome, was brought to the ED for pain  predominantly in his lower back and in his leg.  He was found to have hyponatremia and the hospitalist service was asked to admit this patient for further evaluation.  For further details, please see the history and physical that was dictated by Dr. Joneen Roach on admission.  PERTINENT RADIOLOGICAL STUDIES:  X-ray of the right knee two views, showed degenerative changes similar to the prior study with no acute abnormality.  PERTINENT LABORATORY DATA: 1. CBC on discharge shows WBC of 7.4, hemoglobin of 10.8, hematocrit     of 33.1 and platelet count of 288. 2. Chemistries on discharge shows a sodium of 137, potassium of 4.1,     chloride of 103, bicarbonate of 28, glucose of 136, BUN of 13,     creatinine of 0.81, and calcium of 9.4. 3. HbA1c is 7.5. 4. Serum osmolality was 295.  Urine osmolality was low at 140.     Alcohol level on admission was 218.  BRIEF HOSPITAL COURSE: 1. Hyponatremia.  This was probably secondary to beer potomania.  The     patient's sodium level on admission was 122.  The patient was     gently hydrated.  With just brief hydration, the patient's sodium     corrected.  His urine and serum osmolality as above.  The  patient's     IV fluid has been discontinued for more than 22 hours and his serum     sodium levels are still as indicated as above. 2. EtOH abuse.  The patient did have an alcohol level of 218 on     admission.  He currently has no signs of alcohol withdrawal.  He     was placed on Ativan per CIWA protocol along with MVI, thiamine,     and folate.  The patient is currently ambulatory in the room with     the help of a walker and indicates he wishes to be discharged back     to his shelter. 3. Hypertension.  The patient has been noncompliant to his medication     and then he has been restarted on lisinopril, prescription for a 30-     day supply had been provided to the patient. 4. History of diabetes.  The patient again has been noncompliance to      his medications and he has been given a supply of both glipizide     and metformin and he has been counseled extensively. 5. Neuropathy.  The patient will continue Neurontin. 6. Depression.  The patient on admission did claim to have ongoing     depressive symptoms because of his chronic pain and his lifestyle     living circumstance.  He was seen by Dr. Rogers Blocker, restarted on     his Cymbalta and has been cleared by Dr. Rogers Blocker for discharge as     well.  During my initial evaluation of him, he did not indicate     that he was either suicidal or homicidal.  He will be continued on     Cymbalta and as noted above Psychiatry had seen this patient during     this admission and has cleared the patient to be discharged from     their point of view as well. 7. Chronic pain syndrome.  The patient is maintained on Neurontin and     he will be given some supply of Norco as he chronically takes them. 8. History of COPD.  This is stable.  The patient unfortunately still     uses tobacco.  He has been counseled extensively by me about the     importance of stopping tobacco, especially when he has underlying     COPD.  He claims understanding.  In regards to medication, he will     be maintained and continued on Spiriva. 9. Right knee osteoarthritis.  The patient has been advised to follow     up at the [facility] for perhaps an orthopedic consultation.  DISPOSITION:  The patient initially wanted to be transferred to a local assisted living facility.  However, Child psychotherapist did attempt to place him to facility.  However, apparently, the patient has been to numerous facilities in the area and has not had good experience with them and a facility actually has refused to take the patient upon discussion with the social worker.  The patient indicates to me that he would rather prefer to be discharged today back to his shelter and he has been provided with outpatient resources by a Arts development officer.  FOLLOWUP INSTRUCTIONS:  The patient is to follow up with his primary care practitioner within 1 to 2 weeks upon discharge.  Total time spent for discharge equals 45 minutes.     Jeoffrey Massed, MD     SG/MEDQ  D:  12/14/2010  T:  12/14/2010  Job:  [id number]  Electronically Signed by Jeoffrey Massed  on 12/17/2010 03:43:32 PM

## 2010-12-19 NOTE — Consult Note (Signed)
  NAME:  Ernest [name], Ernest [name] NO.:  [id number]  MEDICAL RECORD NO.:  [id number]  LOCATION:  1537                         FACILITY:  [facility]  PHYSICIAN:  Eulogio Ditch, MD DATE OF BIRTH:  1940/07/10  DATE OF CONSULTATION:  12/14/2010 DATE OF DISCHARGE:  12/14/2010                                CONSULTATION   REASON FOR CONSULT:  Depression.  HISTORY OF PRESENT ILLNESS:  This is a 70 year old Caucasian male who has a long history of alcohol abuse along with drug abuse.  The patient was found intoxicated and confused at a bus stop and was brought to the ER.  The patient is homeless.  The patient is very logical and goal directed during the interview.  He reported that he was going to [facility] and they told him to go to [place] to get his treatment for chronic pain.  The patient was very conversant this time, pleasant on approach, smiling appropriately.  Reported depressed mood, but he does not seem to have severe depressive symptoms.  He is not suicidal or homicidal, not hallucinating or delusional.  Alert, awake, oriented x3.  Memory, immediate, recent, remote fair.  Attention, concentration good.  Abstraction ability good.  Insight and judgment intact.  On discussing about his alcohol abuse, he stated that he has decreased the use of alcohol, but he is depressed because of his financial issues and homeless situation.  I spoke with him on increasing his Cymbalta from 30-60 mg, he agreed on that.  He stated that he is sleeping much better on trazodone 50 mg at bedtime.  PAST MEDICAL HISTORY: 1. Chronic pain syndrome. 2. Hypertension. 3. Hepatitis C. 4. COPD. 5. Polysubstance abuse.  ALLERGIES:  No known drug allergies.  MENTAL STATUS EXAMINATION:  Not suicidal or homicidal.  Not hallucinating or delusional.  Alert, awake, oriented x3.  Insight and judgment intact.  DIAGNOSES:  Axis I:  Chronic polysubstance abuse; depressive disorder, not  otherwise specified. Axis II:  Deferred. Axis III:  Hypertension, back pain, diabetes, peripheral neuropathy, chronic obstructive pulmonary disease, hyperlipidemia. Axis IV:  Chronic medical issues and psychosocial stressors. Axis V:  60.  RECOMMENDATIONS: 1. This patient can be followed in the outpatient setting. 2. I increased his Cymbalta from 30-60 mg p.o. daily.  Thanks for involving me in taking care of this patient.     Eulogio Ditch, MD     SA/MEDQ  D:  12/15/2010  T:  12/15/2010  Job:  [id number]  Electronically Signed by Eulogio Ditch  on 12/19/2010 02:59:07 PM

## 2010-12-26 ENCOUNTER — Encounter: Payer: Self-pay | Admitting: *Deleted

## 2010-12-26 ENCOUNTER — Emergency Department (HOSPITAL_BASED_OUTPATIENT_CLINIC_OR_DEPARTMENT_OTHER)
Admission: EM | Admit: 2010-12-26 | Discharge: 2010-12-27 | Disposition: A | Discharge status: Home / Self Care | Payer: Medicare Other | Attending: Emergency Medicine | Admitting: Emergency Medicine

## 2010-12-26 DIAGNOSIS — F1010 Alcohol abuse, uncomplicated: Secondary | ICD-10-CM | POA: Insufficient documentation

## 2010-12-26 DIAGNOSIS — E119 Type 2 diabetes mellitus without complications: Secondary | ICD-10-CM | POA: Insufficient documentation

## 2010-12-26 DIAGNOSIS — I10 Essential (primary) hypertension: Secondary | ICD-10-CM | POA: Insufficient documentation

## 2010-12-26 HISTORY — DX: Essential (primary) hypertension: I10

## 2010-12-26 HISTORY — DX: Polyneuropathy, unspecified: G62.9

## 2010-12-26 LAB — CBC
HCT: 34.1 % — ABNORMAL LOW (ref 39.0–52.0)
Hemoglobin: 11 g/dL — ABNORMAL LOW (ref 13.0–17.0)
RBC: 4.21 MIL/uL — ABNORMAL LOW (ref 4.22–5.81)
WBC: 9.6 10*3/uL (ref 4.0–10.5)

## 2010-12-26 NOTE — ED Provider Notes (Signed)
History     CSN: [id number] Arrival date & time: 12/26/2010 10:14 PM  Chief Complaint  Patient presents with  . Alcohol Intoxication   HPI Comments: The patient has a history of alcohol abuse and frequent visits to the emergency department for same. Presents by EMS with complaint of alcohol intoxication and "I want something to eat". He denies complaints of pain, nausea, headache, shortness of breath, fever, rash, diarrhea. Patient refuses to give any other history at this time but states "the people at [facility] no what my problems are"  Patient is a 70 y.o. male presenting with intoxication. The history is provided by the patient and the EMS personnel. History Limited By: Alcohol intoxication.  Alcohol Intoxication Pertinent negatives include no chest pain, no abdominal pain, no headaches and no shortness of breath.    Past Medical History  Diagnosis Date  . Diabetes mellitus   . Hypertension   . Neuropathy     History reviewed. No pertinent past surgical history.  No family history on file.  History  Substance Use Topics  . Smoking status: Not on file  . Smokeless tobacco: Not on file  . Alcohol Use: Yes      Review of Systems  Unable to perform ROS Constitutional: Negative for fever and chills.  HENT: Negative for sore throat and neck pain.   Eyes: Negative for visual disturbance.  Respiratory: Negative for cough and shortness of breath.   Cardiovascular: Negative for chest pain.  Gastrointestinal: Negative for nausea, vomiting, abdominal pain and diarrhea.  Genitourinary: Negative for dysuria and frequency.  Musculoskeletal: Negative for back pain.  Skin: Negative for rash.  Neurological: Negative for weakness, numbness and headaches.  Hematological: Negative for adenopathy.  Psychiatric/Behavioral: Negative for behavioral problems.    Physical Exam  BP 133/79  Pulse 62  Resp 18  SpO2 98%  Physical Exam  Nursing note and vitals reviewed. Constitutional:  He appears well-developed and well-nourished. No distress.  HENT:  Head: Normocephalic and atraumatic.  Mouth/Throat: Oropharynx is clear and moist. No oropharyngeal exudate.  Eyes: Conjunctivae and EOM are normal. Pupils are equal, round, and reactive to light. Right eye exhibits no discharge. Left eye exhibits no discharge. No scleral icterus.  Neck: Normal range of motion. Neck supple. No JVD present. No thyromegaly present.  Cardiovascular: Normal rate, regular rhythm, normal heart sounds and intact distal pulses.  Exam reveals no gallop and no friction rub.   No murmur heard. Pulmonary/Chest: Effort normal and breath sounds normal. No respiratory distress. He has no wheezes. He has no rales.  Abdominal: Soft. Bowel sounds are normal. He exhibits no distension and no mass. There is no tenderness.  Musculoskeletal: Normal range of motion. He exhibits no edema and no tenderness.  Lymphadenopathy:    He has no cervical adenopathy.  Neurological: He is alert. Coordination normal.  Skin: Skin is warm and dry. No rash noted. No erythema.  Psychiatric: He has a normal mood and affect. His behavior is normal.    ED Course  Procedures  MDM Patient has been cantankerous, cursing at the nurses, disrespectful to staff. His vital signs are normal, his alcohol level was just over 100 on arrival. He is clinically sober and stable for discharge. Repeatedly the patient stated that his reason for being here was that he wanted something to eat and had no other medical complaints.      Ernest Roller, MD 12/27/10 [phone]

## 2010-12-26 NOTE — ED Notes (Signed)
Pt states he is hungry and that he called EMS because he needs "something to eat". Pt is uncooperative, refusing to answer questions "until he gets something to eat". Explained to patient that he could not have anything to eat until he is seen by the doctor. Continues to be uncooperative and stating that he hasn't eaten in two days.

## 2010-12-26 NOTE — ED Notes (Signed)
Patient given paper scrubs to clean himself. He is saturated in his own urine, but refuses to change until he gets something to eat. Patient encouraged to allow staff to obtain blood and help him clean up and he refuses to give blood at this time as well. Assigned RN made aware

## 2010-12-26 NOTE — ED Notes (Signed)
Attempted to draw ordered labs and patient refused procedure. Explained to patient what procedure entailed and patient refused completely. Nurse notified.
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
## 2010-12-26 NOTE — ED Notes (Signed)
Patient uncooperative with vital signs. Patient has urinated all over his self but refuses to change until he has something to eat. Explained he will not eat until seen by a physician and he states he wants the physician now. Patient raising voice and rude to staff.

## 2010-12-27 LAB — COMPREHENSIVE METABOLIC PANEL
ALT: 24 U/L (ref 0–53)
Alkaline Phosphatase: 59 U/L (ref 39–117)
BUN: 9 mg/dL (ref 6–23)
Chloride: 103 mEq/L (ref 96–112)
GFR calc Af Amer: >60 mL/min (ref >60)
Glucose, Bld: 142 mg/dL — ABNORMAL HIGH (ref 70–99)
Potassium: 4.4 mEq/L (ref 3.5–5.1)
Total Bilirubin: 0.2 mg/dL — ABNORMAL LOW (ref 0.3–1.2)

## 2010-12-27 LAB — URINALYSIS, ROUTINE W REFLEX MICROSCOPIC
Glucose, UA: 250 mg/dL — AB
Leukocytes, UA: NEGATIVE
Nitrite: NEGATIVE
Protein, ur: NEGATIVE mg/dL
pH: 5 (ref 5.0–8.0)

## 2010-12-27 LAB — RAPID URINE DRUG SCREEN, HOSP PERFORMED
Amphetamines: NOT DETECTED
Barbiturates: NOT DETECTED

## 2010-12-27 LAB — ETHANOL: Alcohol, Ethyl (B): 111 mg/dL — ABNORMAL HIGH (ref 0–11)

## 2010-12-27 LAB — ACETAMINOPHEN LEVEL: Acetaminophen (Tylenol), Serum: <15 ug/mL (ref 10–30)

## 2010-12-27 NOTE — ED Notes (Signed)
Cab voucher provided and pt to be transported to [address] [employer] to [facility]. Pt d/c'd with walker and belongings.

## 2010-12-27 NOTE — ED Notes (Signed)
Re[ort received from Waldo Laine, RN, care assumed.

## 2010-12-27 NOTE — ED Notes (Signed)
After explaining to the patient that he would be given food once he let staff draw his blood and collect urine, patient finally agreed to let his blood be drawn. Patient continues to request to be taken to the [facility] hospital in [place].

## 2010-12-27 NOTE — ED Notes (Signed)
Pt incontinent of bladder and adult diaper saturated.  Pt cleansed and paper scrubs provided.  Pt informed of plan of care. Breakfast provided.

## 2011-01-07 LAB — DIFFERENTIAL
Basophils Absolute: 0.1
Basophils Relative: 1
Eosinophils Absolute: 0.3
Monocytes Relative: 6
Neutro Abs: 5.7
Neutrophils Relative %: 61

## 2011-01-07 LAB — BASIC METABOLIC PANEL
BUN: 14
CO2: 30
Calcium: 9.9
Chloride: 95 — ABNORMAL LOW
Creatinine, Ser: 0.9
GFR calc Af Amer: >60
Glucose, Bld: 154 — ABNORMAL HIGH

## 2011-01-07 LAB — CBC
MCHC: 34.1
MCV: 91.1
Platelets: 262
RBC: 5.16
RDW: 18 — ABNORMAL HIGH

## 2011-01-07 LAB — URINALYSIS, ROUTINE W REFLEX MICROSCOPIC
Glucose, UA: 100 — AB
Hgb urine dipstick: NEGATIVE
Ketones, ur: NEGATIVE
Protein, ur: NEGATIVE
Urobilinogen, UA: 0.2

## 2011-01-08 LAB — BASIC METABOLIC PANEL
BUN: 6
CO2: 27
Calcium: 8.5
Chloride: 90 — ABNORMAL LOW
Creatinine, Ser: 0.71
Creatinine, Ser: 1.02
Glucose, Bld: 125 — ABNORMAL HIGH
Glucose, Bld: 190 — ABNORMAL HIGH
Potassium: 4

## 2011-01-08 LAB — CBC
HCT: 41.6
Hemoglobin: 13.8
MCHC: 35
MCHC: 35.1
MCV: 92.9
Platelets: 298
Platelets: 300
RDW: 14.5
RDW: 14.6

## 2011-01-08 LAB — COMPREHENSIVE METABOLIC PANEL
ALT: 35
ALT: 39
Albumin: 3.6
Albumin: 3.7
Alkaline Phosphatase: 58
Calcium: 8.8
Calcium: 9.8
GFR calc Af Amer: >60
GFR calc Af Amer: >60
Glucose, Bld: 132 — ABNORMAL HIGH
Glucose, Bld: 157 — ABNORMAL HIGH
Potassium: 4.3
Sodium: 124 — ABNORMAL LOW
Sodium: 134 — ABNORMAL LOW
Total Protein: 6.7
Total Protein: 6.7

## 2011-01-08 LAB — RAPID URINE DRUG SCREEN, HOSP PERFORMED
Barbiturates: NOT DETECTED
Benzodiazepines: NOT DETECTED
Benzodiazepines: POSITIVE — AB
Cocaine: NOT DETECTED
Cocaine: NOT DETECTED
Opiates: NOT DETECTED

## 2011-01-08 LAB — URINALYSIS, ROUTINE W REFLEX MICROSCOPIC
Bilirubin Urine: NEGATIVE
Glucose, UA: 250 — AB
Ketones, ur: NEGATIVE
Nitrite: NEGATIVE
Specific Gravity, Urine: <1.005 — ABNORMAL LOW
pH: 5

## 2011-01-08 LAB — ETHANOL
Alcohol, Ethyl (B): 201 — ABNORMAL HIGH
Alcohol, Ethyl (B): 69 — ABNORMAL HIGH
Alcohol, Ethyl (B): 86 — ABNORMAL HIGH

## 2011-01-08 LAB — DIFFERENTIAL
Basophils Absolute: 0
Basophils Relative: 1
Eosinophils Absolute: 0.1
Eosinophils Absolute: 0.3
Eosinophils Relative: 1
Lymphs Abs: 3.4
Monocytes Relative: 7
Neutrophils Relative %: 55
Neutrophils Relative %: 68

## 2011-01-10 LAB — DIFFERENTIAL
Basophils Absolute: 0.2 — ABNORMAL HIGH
Basophils Absolute: 0.1
Basophils Relative: 3 — ABNORMAL HIGH
Eosinophils Absolute: 0.1
Eosinophils Relative: 2
Eosinophils Relative: 3
Lymphocytes Relative: 36
Lymphocytes Relative: 44
Monocytes Absolute: 0.7

## 2011-01-10 LAB — COMPREHENSIVE METABOLIC PANEL
ALT: 35
ALT: 42
AST: 31
AST: 30
Albumin: 4
Alkaline Phosphatase: 52
Alkaline Phosphatase: 53
CO2: 20
Chloride: 96
Chloride: 100
Creatinine, Ser: 0.64
Creatinine, Ser: 0.66
GFR calc Af Amer: >60
GFR calc Af Amer: >60
GFR calc non Af Amer: >60
Potassium: 3.9
Potassium: 4
Sodium: 132 — ABNORMAL LOW
Total Bilirubin: 0.6
Total Bilirubin: 0.5

## 2011-01-10 LAB — URINALYSIS, ROUTINE W REFLEX MICROSCOPIC
Bilirubin Urine: NEGATIVE
Hgb urine dipstick: NEGATIVE
Specific Gravity, Urine: 1.003 — ABNORMAL LOW
Urobilinogen, UA: 0.2
pH: 5.5

## 2011-01-10 LAB — ETHANOL: Alcohol, Ethyl (B): 268 — ABNORMAL HIGH

## 2011-01-10 LAB — CBC
HCT: 45.3
Hemoglobin: 14.9
Hemoglobin: 15.6
MCHC: 34.5
MCV: 94.7
Platelets: 278
RBC: 4.55
RBC: 4.78
RDW: 14.6

## 2011-01-10 LAB — POCT TOXICOLOGY PANEL
Amphetamines: NEGATIVE
Barbiturates: NEGATIVE
Opiates: NEGATIVE
TCA Scrn: NEGATIVE

## 2011-01-10 LAB — POCT CARDIAC MARKERS: Operator id: 244461

## 2011-01-10 LAB — RAPID URINE DRUG SCREEN, HOSP PERFORMED: Tetrahydrocannabinol: NOT DETECTED

## 2011-01-11 LAB — PROTIME-INR
INR: 1
Prothrombin Time: 13.2

## 2011-01-11 LAB — CBC
Hemoglobin: 14.4
MCHC: 34.4
RBC: 4.45
WBC: 8.5

## 2011-01-11 LAB — BASIC METABOLIC PANEL
BUN: 10
BUN: 12
BUN: 13
BUN: 14
CO2: 27
Calcium: 9.3
Calcium: 9.3
Calcium: 9.8
Chloride: 101
Chloride: 94 — ABNORMAL LOW
Chloride: 97
Chloride: 97
Chloride: 99
Creatinine, Ser: 0.8
Creatinine, Ser: 0.91
Creatinine, Ser: 1.04
GFR calc Af Amer: >60
GFR calc Af Amer: >60
GFR calc Af Amer: >60
GFR calc Af Amer: >60
GFR calc non Af Amer: >60
GFR calc non Af Amer: >60
GFR calc non Af Amer: >60
GFR calc non Af Amer: >60
GFR calc non Af Amer: >60
Glucose, Bld: 109 — ABNORMAL HIGH
Glucose, Bld: 128 — ABNORMAL HIGH
Glucose, Bld: 137 — ABNORMAL HIGH
Glucose, Bld: 139 — ABNORMAL HIGH
Glucose, Bld: 148 — ABNORMAL HIGH
Glucose, Bld: 151 — ABNORMAL HIGH
Glucose, Bld: 156 — ABNORMAL HIGH
Potassium: 4.2
Potassium: 4.3
Potassium: 4.3
Potassium: 4.6
Potassium: 4.7
Potassium: 5.3 — ABNORMAL HIGH
Sodium: 128 — ABNORMAL LOW
Sodium: 128 — ABNORMAL LOW
Sodium: 129 — ABNORMAL LOW
Sodium: 130 — ABNORMAL LOW
Sodium: 131 — ABNORMAL LOW

## 2011-01-11 LAB — POCT TOXICOLOGY PANEL: Barbiturates: POSITIVE

## 2011-01-11 LAB — B-NATRIURETIC PEPTIDE (CONVERTED LAB): Pro B Natriuretic peptide (BNP): <30

## 2011-01-11 LAB — CARDIAC PANEL(CRET KIN+CKTOT+MB+TROPI)
CK, MB: 2.8
CK, MB: 3
Relative Index: 2.9 — ABNORMAL HIGH
Total CK: 104
Total CK: 95
Troponin I: 0.02

## 2011-01-11 LAB — DIFFERENTIAL
Basophils Absolute: 0.1
Basophils Relative: 1
Eosinophils Absolute: 0.1
Eosinophils Relative: 1
Lymphs Abs: 2.5
Neutrophils Relative %: 59

## 2011-01-11 LAB — GLUCOSE, CAPILLARY
Glucose-Capillary: 105 — ABNORMAL HIGH
Glucose-Capillary: 115 — ABNORMAL HIGH
Glucose-Capillary: 116 — ABNORMAL HIGH
Glucose-Capillary: 118 — ABNORMAL HIGH
Glucose-Capillary: 119 — ABNORMAL HIGH
Glucose-Capillary: 133 — ABNORMAL HIGH
Glucose-Capillary: 144 — ABNORMAL HIGH
Glucose-Capillary: 150 — ABNORMAL HIGH
Glucose-Capillary: 152 — ABNORMAL HIGH
Glucose-Capillary: 153 — ABNORMAL HIGH
Glucose-Capillary: 161 — ABNORMAL HIGH
Glucose-Capillary: 162 — ABNORMAL HIGH
Glucose-Capillary: 180 — ABNORMAL HIGH
Glucose-Capillary: 180 — ABNORMAL HIGH
Glucose-Capillary: 185 — ABNORMAL HIGH
Glucose-Capillary: 70
Glucose-Capillary: 81
Glucose-Capillary: 86

## 2011-01-11 LAB — COMPREHENSIVE METABOLIC PANEL
ALT: 27
AST: 30
Alkaline Phosphatase: 52
CO2: 21
Chloride: 89 — ABNORMAL LOW
GFR calc Af Amer: >60
GFR calc non Af Amer: >60
Glucose, Bld: 86
Potassium: 3.6
Sodium: 122 — ABNORMAL LOW
Total Bilirubin: 0.3

## 2011-01-11 LAB — HEMOGLOBIN A1C: Hgb A1c MFr Bld: 6.4 — ABNORMAL HIGH

## 2011-01-11 LAB — FOLATE RBC: RBC Folate: 1254 — ABNORMAL HIGH

## 2011-01-17 LAB — URINALYSIS, ROUTINE W REFLEX MICROSCOPIC
Nitrite: NEGATIVE
Specific Gravity, Urine: 1.001 — ABNORMAL LOW (ref 1.005–1.030)
Urobilinogen, UA: 0.2 mg/dL (ref 0.0–1.0)
pH: 5.5 (ref 5.0–8.0)

## 2011-01-17 LAB — CBC
MCHC: 33.7 g/dL (ref 30.0–36.0)
RBC: 4.63 MIL/uL (ref 4.22–5.81)
RDW: 16.4 % — ABNORMAL HIGH (ref 11.5–15.5)

## 2011-01-17 LAB — DIFFERENTIAL
Basophils Absolute: 0.1 10*3/uL (ref 0.0–0.1)
Basophils Relative: 1 % (ref 0–1)
Lymphocytes Relative: 24 % (ref 12–46)
Monocytes Relative: 5 % (ref 3–12)
Neutro Abs: 7 10*3/uL (ref 1.7–7.7)
Neutrophils Relative %: 70 % (ref 43–77)

## 2011-01-17 LAB — RAPID URINE DRUG SCREEN, HOSP PERFORMED
Barbiturates: NOT DETECTED
Opiates: NOT DETECTED

## 2011-01-17 LAB — BASIC METABOLIC PANEL
CO2: 22 mEq/L (ref 19–32)
Calcium: 9.1 mg/dL (ref 8.4–10.5)
Creatinine, Ser: 0.59 mg/dL (ref 0.4–1.5)
GFR calc Af Amer: >60 mL/min (ref >60)
GFR calc non Af Amer: >60 mL/min (ref >60)
Sodium: 128 mEq/L — ABNORMAL LOW (ref 135–145)

## 2011-01-17 LAB — GLUCOSE, CAPILLARY: Glucose-Capillary: 121 mg/dL — ABNORMAL HIGH (ref 70–99)

## 2011-01-18 LAB — GLUCOSE, CAPILLARY
Glucose-Capillary: 115 mg/dL — ABNORMAL HIGH (ref 70–99)
Glucose-Capillary: 161 mg/dL — ABNORMAL HIGH (ref 70–99)
Glucose-Capillary: 168 mg/dL — ABNORMAL HIGH (ref 70–99)
Glucose-Capillary: 176 mg/dL — ABNORMAL HIGH (ref 70–99)
Glucose-Capillary: 179 mg/dL — ABNORMAL HIGH (ref 70–99)
Glucose-Capillary: 184 mg/dL — ABNORMAL HIGH (ref 70–99)
Glucose-Capillary: 206 mg/dL — ABNORMAL HIGH (ref 70–99)
Glucose-Capillary: 218 mg/dL — ABNORMAL HIGH (ref 70–99)

## 2011-01-22 LAB — DIFFERENTIAL
Basophils Absolute: 0.1
Eosinophils Relative: 2
Lymphocytes Relative: 41
Monocytes Absolute: 0.6
Monocytes Relative: 8

## 2011-01-22 LAB — BASIC METABOLIC PANEL
BUN: 12
CO2: 22
GFR calc Af Amer: >60
GFR calc non Af Amer: >60
GFR calc non Af Amer: >60
Glucose, Bld: 108 — ABNORMAL HIGH
Glucose, Bld: 52 — ABNORMAL LOW
Potassium: 4.2
Potassium: 3.5
Sodium: 128 — ABNORMAL LOW

## 2011-01-22 LAB — CBC
HCT: 37.6 — ABNORMAL LOW
Hemoglobin: 12.9 — ABNORMAL LOW
RBC: 4.37
RDW: 19.3 — ABNORMAL HIGH

## 2011-01-22 LAB — RAPID URINE DRUG SCREEN, HOSP PERFORMED
Amphetamines: NOT DETECTED
Benzodiazepines: POSITIVE — AB

## 2011-01-23 LAB — DIFFERENTIAL
Basophils Absolute: 0.1
Basophils Relative: 1
Eosinophils Relative: 3
Monocytes Absolute: 0.5
Monocytes Relative: 7
Neutro Abs: 3.4

## 2011-01-23 LAB — ETHANOL: Alcohol, Ethyl (B): 91 — ABNORMAL HIGH

## 2011-01-23 LAB — COMPREHENSIVE METABOLIC PANEL
AST: 29
Albumin: 3.5
Alkaline Phosphatase: 47
Chloride: 98
GFR calc Af Amer: >60
Potassium: 4.1
Sodium: 130 — ABNORMAL LOW
Total Bilirubin: 0.4
Total Protein: 6.2

## 2011-01-23 LAB — CBC
HCT: 39
HCT: 40.6
Hemoglobin: 13.8
MCHC: 33.9
Platelets: 326
Platelets: 317
RDW: 17.7 — ABNORMAL HIGH
RDW: 19 — ABNORMAL HIGH
WBC: 6.9

## 2011-01-23 LAB — RAPID URINE DRUG SCREEN, HOSP PERFORMED
Amphetamines: NOT DETECTED
Benzodiazepines: POSITIVE — AB
Cocaine: NOT DETECTED
Tetrahydrocannabinol: NOT DETECTED

## 2011-01-25 LAB — COMPREHENSIVE METABOLIC PANEL
Albumin: 3.9
BUN: 5 — ABNORMAL LOW
Calcium: 9.2
Glucose, Bld: 86
Potassium: 3.4 — ABNORMAL LOW
Sodium: 134 — ABNORMAL LOW
Total Protein: 6.6

## 2011-01-25 LAB — DIFFERENTIAL
Lymphs Abs: 2.5
Monocytes Absolute: 0.7
Monocytes Relative: 9
Neutro Abs: 4.5
Neutrophils Relative %: 57

## 2011-01-25 LAB — CBC
HCT: 38.2 — ABNORMAL LOW
Hemoglobin: 12.7 — ABNORMAL LOW
MCHC: 33.3
Platelets: 331
RDW: 19 — ABNORMAL HIGH

## 2011-01-25 LAB — HEPATIC FUNCTION PANEL
ALT: 39
AST: 31
Albumin: 3.7
Alkaline Phosphatase: 54
Bilirubin, Direct: 0.1
Total Bilirubin: 1

## 2011-01-25 LAB — RAPID URINE DRUG SCREEN, HOSP PERFORMED
Benzodiazepines: POSITIVE — AB
Cocaine: NOT DETECTED
Opiates: NOT DETECTED
Tetrahydrocannabinol: NOT DETECTED

## 2011-01-25 LAB — ETHANOL: Alcohol, Ethyl (B): 229 — ABNORMAL HIGH

## 2011-07-12 ENCOUNTER — Emergency Department (HOSPITAL_COMMUNITY)
Admission: EM | Admit: 2011-07-12 | Discharge: 2011-07-18 | Disposition: A | Discharge status: Other Acute Inpatient Hospital | Payer: Medicare Other | Attending: Emergency Medicine | Admitting: Emergency Medicine

## 2011-07-12 DIAGNOSIS — R4689 Other symptoms and signs involving appearance and behavior: Secondary | ICD-10-CM

## 2011-07-12 DIAGNOSIS — I10 Essential (primary) hypertension: Secondary | ICD-10-CM | POA: Insufficient documentation

## 2011-07-12 DIAGNOSIS — F603 Borderline personality disorder: Secondary | ICD-10-CM | POA: Insufficient documentation

## 2011-07-12 DIAGNOSIS — F1010 Alcohol abuse, uncomplicated: Secondary | ICD-10-CM | POA: Insufficient documentation

## 2011-07-12 DIAGNOSIS — E119 Type 2 diabetes mellitus without complications: Secondary | ICD-10-CM | POA: Insufficient documentation

## 2011-07-12 DIAGNOSIS — F10929 Alcohol use, unspecified with intoxication, unspecified: Secondary | ICD-10-CM

## 2011-07-12 MED ORDER — TRAMADOL HCL 50 MG PO TABS
50.0000 mg | ORAL_TABLET | Freq: Once | ORAL | Status: AC
  Administered 2011-07-12: 50 mg via ORAL
  Filled 2011-07-12: qty 1
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
## 2011-07-12 NOTE — ED Notes (Signed)
Pt brought from the shelter, pt under alcohol influence, pt evected out of the shelter, pt called EMS and states " i need to go to the hospital, i do not have any other place to go to"

## 2011-07-13 ENCOUNTER — Encounter (HOSPITAL_COMMUNITY): Payer: Self-pay | Admitting: *Deleted

## 2011-07-13 LAB — CBC
HCT: 45.4 % (ref 39.0–52.0)
Hemoglobin: 15.5 g/dL (ref 13.0–17.0)
MCH: 29 pg (ref 26.0–34.0)
MCHC: 34.1 g/dL (ref 30.0–36.0)
MCV: 84.9 fL (ref 78.0–100.0)

## 2011-07-13 LAB — GLUCOSE, CAPILLARY

## 2011-07-13 LAB — COMPREHENSIVE METABOLIC PANEL
Albumin: 4.4 g/dL (ref 3.5–5.2)
BUN: 7 mg/dL (ref 6–23)
Calcium: 9.7 mg/dL (ref 8.4–10.5)
Chloride: 91 mEq/L — ABNORMAL LOW (ref 96–112)
Creatinine, Ser: 0.53 mg/dL (ref 0.50–1.35)
GFR calc non Af Amer: >90 mL/min (ref >90)
Total Bilirubin: 0.3 mg/dL (ref 0.3–1.2)

## 2011-07-13 LAB — URINALYSIS, ROUTINE W REFLEX MICROSCOPIC
Bilirubin Urine: NEGATIVE
Ketones, ur: 15 mg/dL — AB
Nitrite: NEGATIVE
Specific Gravity, Urine: 1.021 (ref 1.005–1.030)
Urobilinogen, UA: 1 mg/dL (ref 0.0–1.0)

## 2011-07-13 LAB — DIFFERENTIAL
Basophils Relative: 0 % (ref 0–1)
Eosinophils Absolute: 0.2 10*3/uL (ref 0.0–0.7)
Eosinophils Relative: 2 % (ref 0–5)
Monocytes Absolute: 0.6 10*3/uL (ref 0.1–1.0)
Monocytes Relative: 5 % (ref 3–12)
Neutro Abs: 6.6 10*3/uL (ref 1.7–7.7)

## 2011-07-13 LAB — ETHANOL
Alcohol, Ethyl (B): 257 mg/dL — ABNORMAL HIGH (ref 0–11)
Alcohol, Ethyl (B): 65 mg/dL — ABNORMAL HIGH (ref 0–11)

## 2011-07-13 LAB — URINE MICROSCOPIC-ADD ON: Urine-Other: NONE SEEN

## 2011-07-13 LAB — RAPID URINE DRUG SCREEN, HOSP PERFORMED: Barbiturates: NOT DETECTED

## 2011-07-13 MED ORDER — LORAZEPAM 2 MG/ML IJ SOLN
INTRAMUSCULAR | Status: AC
  Administered 2011-07-13: 2 mg via INTRAVENOUS
  Filled 2011-07-13: qty 1

## 2011-07-13 MED ORDER — HYDROCODONE-ACETAMINOPHEN 5-325 MG PO TABS
1.0000 | ORAL_TABLET | Freq: Once | ORAL | Status: AC
  Administered 2011-07-13: 1 via ORAL
  Filled 2011-07-13: qty 1

## 2011-07-13 MED ORDER — ACETAMINOPHEN 325 MG PO TABS
ORAL_TABLET | ORAL | Status: AC
  Administered 2011-07-13: 650 mg
  Filled 2011-07-13: qty 2

## 2011-07-13 NOTE — ED Notes (Signed)
Phone calls placed to [facility], [facility] and [facility] in [place]. Patient denied at [facility], there are no beds at [facility] and patient sts he can not for to [facility] due to the steps that they have, he says he can not go up and down steps. MD will be notified.

## 2011-07-13 NOTE — ED Notes (Signed)
[facility] T[facility]ie Ellis Engineer, materials at bedside Jesus Genera, Cristela Blue, and [place]. [place] Psychologist, forensic at bedside assisting with patient.

## 2011-07-13 NOTE — ED Notes (Signed)
Pt. Refuse  The vital signs
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
## 2011-07-13 NOTE — BH Assessment (Addendum)
Assessment Note   Ernest [name] is an 71 y.o. male who presented to [facility] Emergency Department with the chief complaint of alcohol intoxication and suicidal ideations. Patient verbalized to Clinical research associate that he is extremely depressed, stating that the past month has been horrible for him. Patient stated that he is a recovering alcoholic and that yesterday he went to the [facility] to seek help for his depression. "They let me stay there for one night but they didn't have any beds available. They brought me to the shelter here in [place] today and dropped me off." Patient reported that once he arrived to [facility] he was informed that there were no beds available and that they would hold his items until 1pm. "I got stressed out and depressed with no where to go. So I did like any other alcoholic would do. I drank 4 beers and I know I messed up. I don't know how I ended up here." Per chart notes patient contacted EMS and requested to be transport to [facility] for evaluation due to intoxication. Patient verbalized that he does have suicidal thoughts although he has no plan to act upon. Patient currently denies HI/AVH at this time. Patient will be referred to Specialist On Call for further evaluation to determine plan of disposition.   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 Diabetes mellitus   . Hypertension   . Neuropathy    Axis IV: housing problems and other psychosocial or environmental problems Axis V: 41-50 serious symptoms  Past Medical History:  Past Medical History  Diagnosis Date  . Diabetes mellitus   . Hypertension   . Neuropathy     History reviewed. No pertinent past surgical history.  Family History: History reviewed. No pertinent family history.  Social History:  reports that he has been smoking Cigarettes.  He has been smoking about 2 packs per day. He does  not have any smokeless tobacco history on file. He reports that he drinks about 2.4 ounces of alcohol per week. His drug history not on file.  Additional Social History:  Alcohol / Drug Use History of alcohol / drug use?: Yes Substance #1 Name of Substance 1: ETOH 1 - Age of First Use: 20s 1 - Amount (size/oz): varies 1 - Frequency: ocassionally  1 - Duration: years 1 - Last Use / Amount: 07/12/11- 4 beers Allergies: No Known Allergies  Home Medications:  Medications Prior to Admission  Medication Dose Route Frequency Provider Last Rate Last Dose  . HYDROcodone-acetaminophen (NORCO) 5-325 MG per tablet 1 tablet  1 tablet Oral Once [name], MD   1 tablet at 07/13/11 0145  . LORazepam (ATIVAN) 2 MG/ML injection        2 mg at 07/13/11 0450  . traMADol (ULTRAM) tablet 50 mg  50 mg Oral Once [name], MD   50 mg at 07/12/11 2327   No current outpatient prescriptions on file as of 07/12/2011.    OB/GYN Status:  No LMP for male patient.  General Assessment Data Location of Assessment: [facility] ED Living Arrangements: Homeless Can pt return to current living arrangement?: Yes Admission Status: Voluntary Is patient capable of signing voluntary admission?: Yes Transfer from: Acute Hospital Referral Source: Self/Family/Friend     Risk to self Suicidal Ideation: Yes-Currently Present Suicidal Intent: No-Not Currently/Within Last 6 Months Is patient at risk for suicide?: No Suicidal Plan?: No  Access to Means: Yes Specify Access to Suicidal Means: Access to objects What has been your use of drugs/alcohol within the last 12 months?: ETOH Previous Attempts/Gestures: No (Pt denies) How many times?: 0  Triggers for Past Attempts: None known Intentional Self Injurious Behavior: None Family Suicide History: No Recent stressful life event(s): Other (Comment) (Homeless, was living at ALF in [place] but decided 2 leav) Persecutory voices/beliefs?: No Depression: Yes Depression  Symptoms: Loss of interest in usual pleasures;Feeling worthless/self pity Substance abuse history and/or treatment for substance abuse?: Yes  Risk to Others Homicidal Ideation: No Thoughts of Harm to Others: No Current Homicidal Intent: No Current Homicidal Plan: No Access to Homicidal Means: No Identified Victim: None Reported  History of harm to others?: No Assessment of Violence: On admission Violent Behavior Description: Pt was aggressive towards staff and required restraint Does patient have access to weapons?: No Criminal Charges Pending?: No Does patient have a court date: No  Psychosis Hallucinations: None noted Delusions: None noted  Mental Status Report Appear/Hygiene: Disheveled Eye Contact: Good Motor Activity: Agitation Speech: Logical/coherent Level of Consciousness: Alert Mood: Fearful;Guilty;Helpless;Ashamed/humiliated Affect: Depressed Anxiety Level: None Thought Processes: Coherent;Relevant Judgement: Impaired Orientation: Person;Place;Time;Situation Obsessive Compulsive Thoughts/Behaviors: None  Cognitive Functioning Concentration: Decreased Memory: Recent Intact;Remote Intact IQ: Average Insight: Fair Impulse Control: Poor Appetite: Fair Weight Loss: 0  Weight Gain: 0  Sleep: Decreased Total Hours of Sleep: 6  Vegetative Symptoms: None  Prior Inpatient Therapy Prior Inpatient Therapy: Yes Prior Therapy Facilty/Provider(s): [facility] Reason for Treatment: depression   Prior Outpatient Therapy Prior Outpatient Therapy: No (Pt denies but was observed to be unsure)  ADL Screening (condition at time of admission) Patient's cognitive ability adequate to safely complete daily activities?: Yes Patient able to express need for assistance with ADLs?: Yes Communication: Independent Dressing (OT): Independent Grooming: Independent Feeding: Independent Bathing: Independent Toileting: Independent In/Out Bed: Independent Walks in Home:  Independent with device (comment) (Cane or mobilized chair) Weakness of Arms/Hands: None  Home Assistive Devices/Equipment Home Assistive Devices/Equipment: Cane (Mobilized chair)  Therapy Consults (therapy consults require a physician order) PT Evaluation Needed: No OT Evalulation Needed: No SLP Evaluation Needed: No Abuse/Neglect Assessment (Assessment to be complete while patient is alone) Physical Abuse: Denies Verbal Abuse: Denies Sexual Abuse: Denies Exploitation of patient/patient's resources: Denies Self-Neglect: Denies Values / Beliefs Cultural Requests During Hospitalization: None Spiritual Requests During Hospitalization: None Consults Spiritual Care Consult Needed: No Social Work Consult Needed: No      Additional Information 1:1 In Past 12 Months?: No CIRT Risk: No Elopement Risk: No Does patient have medical clearance?: Yes     Disposition: Referral to Specialist On Call to determine plan of disposition.  Disposition Disposition of Patient: Referred to (Specialist On Call)  On Site Evaluation by:  Self Reviewed with Physician:  Dr. Marcia Brash, MD.   Paulino Door, GREGORY C 07/13/2011 3:02 PM
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
## 2011-07-13 NOTE — Progress Notes (Signed)
2:33 PM Pt seen by [facility] counselor, who recommends Telepsych consult.

## 2011-07-13 NOTE — ED Notes (Signed)
Dr. Preston Fleeting informed that patient was swing at staff and cursing at staff. New orders received verbal and read back for Ativan 2 mg IVP.

## 2011-07-13 NOTE — ED Notes (Signed)
Called [facility] and talked to a Mrs. Jewel Baize who said they would keep patient's belongings for two weeks.  Pt. Made aware of this conversation.

## 2011-07-13 NOTE — ED Provider Notes (Signed)
  Physical Exam  BP 143/78  Pulse 80  Temp(Src) 98.6 F (37 C) (Oral)  Resp 18  SpO2 97%  Physical Exam  ED Course  Procedures  MDM Patient was seen by telemetry site and was cleared for his depression. He's been kicked out of a shelter. After discussion with social worker, she'll come in tomorrow and they will attempt a placement for him. His med rec shows he's been on no medications. Patient later stated that he is on OxyContin and wanted some. Review of the controlled substance database did not show that he has had any OxyContin. He was given Tylenol      Ernest [name]. Rubin Payor, MD 07/13/11 2329

## 2011-07-13 NOTE — ED Notes (Signed)
TELE PYSCH  STARTED 

## 2011-07-13 NOTE — Progress Notes (Signed)
11:21 AM Pt is now sober. He is a [place] patient, apparently hospitalized for one day at the [facility] and then released. He was staying at the homeless shelter--he says his clothes are still there.  He got depressed and went to drinking last night, and got kicked out of the shelter.  Now he says he is depressed and suicidal.  He has had prior treatment at [facility].  Will request urinalysis and urine drug screen to complete his medical screening, and will request ACT team evaluation.

## 2011-07-13 NOTE — ED Notes (Signed)
TELE PYSCH  COMPLETED MD HORWATH

## 2011-07-13 NOTE — ED Notes (Signed)
Pt refusing lab,  RN aware

## 2011-07-13 NOTE — ED Notes (Signed)
Spoke with Unice Bailey regarding trouble finding placement for patient. Ernest [name] sts that she will be in in the morning to help find placement for patient.

## 2011-07-13 NOTE — ED Notes (Signed)
Patient placed in restraints x 4. Patient has good circulation in all extremities. Skin pink and dry. Respirations equal and unlabored. No acute distress noted. Patient placed on Cardiac monitor.

## 2011-07-14 LAB — GLUCOSE, CAPILLARY
Glucose-Capillary: 220 mg/dL — ABNORMAL HIGH (ref 70–99)
Glucose-Capillary: 230 mg/dL — ABNORMAL HIGH (ref 70–99)

## 2011-07-14 MED ORDER — DIPHENHYDRAMINE HCL 50 MG/ML IJ SOLN: 25.0000 mg | Freq: Once | INTRAMUSCULAR | Status: DC

## 2011-07-14 MED ORDER — IBUPROFEN 200 MG PO TABS
600.0000 mg | ORAL_TABLET | Freq: Four times a day (QID) | ORAL | Status: DC | PRN
  Administered 2011-07-14 – 2011-07-17 (×4): 600 mg via ORAL
  Filled 2011-07-14 (×4): qty 3

## 2011-07-14 MED ORDER — LORAZEPAM 1 MG PO TABS
1.0000 mg | ORAL_TABLET | Freq: Once | ORAL | Status: AC
  Administered 2011-07-14: 1 mg via ORAL
  Filled 2011-07-14: qty 1

## 2011-07-14 MED ORDER — ACETAMINOPHEN 325 MG PO TABS
650.0000 mg | ORAL_TABLET | Freq: Four times a day (QID) | ORAL | Status: DC | PRN
  Administered 2011-07-14 – 2011-07-18 (×6): 650 mg via ORAL
  Filled 2011-07-14 (×6): qty 2

## 2011-07-14 MED ORDER — PROMETHAZINE HCL 25 MG/ML IJ SOLN
25.0000 mg | Freq: Once | INTRAMUSCULAR | Status: DC
  Filled 2011-07-14: qty 1

## 2011-07-14 NOTE — ED Notes (Signed)
CSW telephoned surrounding ALFs to determine if there is a bed vacancy for pt due to pt being in ED. [facility], Royanne Foots at [facility], [facility], [place], and [place] [facility] stated that there is no admissions staff available on weekends to process referral although information can be reviewed tomorrow with a disposition. CSW informed MD who is agreeable to ALF placement if pt can be placed by tomorrow. CSW will send pt referral through [facility] for ALF placement at this time.

## 2011-07-14 NOTE — ED Notes (Signed)
Berlinda Last informed that patient is in pain. Patient sts that he takes hydrocodone on a daily basis but no Rx meds in hx. Tried to obtain medication history last night and patient informed me that he didn't want to do that at this time, that we would input his medications in the morning. Patient sts that he would like to die now, that the pain is unbearable. MD [facility] informed, awaiting further instruction.

## 2011-07-14 NOTE — Progress Notes (Signed)
Spiritual Care:  Paged and was in unit in 20 minutes. Patient requested a Bible and to speak to a chaplain about his depression and his thoughts of suicide. In conversation it was revealed he is homeless, separated from his belongings - which are at a local shelter - separated from the charger to his scooter chair - which is his primary worry - and detached from any safe experience. Ernest [name] is a [place] era veteran who is qualified for full benefits, given his income level and age. He has lost his glasses and needs to get to a local VA clinic for replacement. He has a sister in [place] who is detached emotionally from him because her husband has lost sympathy and empathy with Ernest Mulvehill.   Our social workers are assisting him greatly but have found limited resources locally to help him. He has great appreciation for the staff at [facility] and the care he has been provided.  We spoke of his spiritual life and found that he is a person of strong faith. He could quote [place] scripture with reason and query. His personal Bible is with his belongs in the local shelter, so the Bible given him is a substitute until he is reunited with his belongings. We discussed meditation exercises to assist him in his depression that is rooted in his sense of worthlessness and disconnection. He admits he has been diagnosed as an alcoholic and we discussed how his spirituality can assist in not resorting to alcohol which because of his alcoholism leads to disfunctional behaviors.   Ernest Pavich's suicidal thoughts are of concern but at present are not to the level of a plan or method. Again it appears it has to do with feelings of worthlessness and disconnection rather than a focused sense of wanting to die. When reasonable and attainable alternatives to his present condition he seems to lay aside such thoughts and begins real dialogue about a hopeful future. He holds a Associates degree and when he speaks of using his  VA GI Bill benefits to finish his degree or receive training he begins to speak of possibilities for a brighter future.  When he is discharged from [facility] hospital he should be assisted by Spiritual Care and/or Social Work in finding clothing for the transition back to his belongings. Of concern to him is a coat if it is cold and pants, socks and shoes.   Prayers for connection to God and peace to see him through were prayed at his request.  Recommend Social Workers assist with connecting pt to his VA benefits for glasses, and Social Care to visit pt on 1 April for spiritual follow up.  Benjie Karvonen. Ishmel Acevedo, APC, D.Min Chaplain 3:13 PM  07/14/2011

## 2011-07-14 NOTE — ED Notes (Signed)
CSW informed pt that there are no beds available at the surrounding shelters at this time. CSW inquired if pt has any family or friends that would be able to provide housing assistance. Pt informed Clinical research associate that he has no family. Pt became agitated when informed that there are no beds available at the surrounding shelters. "I can't live in a shelter. I'm too old. If you send me there I will kill myself. I'll jump off a bridge or something. I want to be at a assisted living." CSW inquired why pt left his past ALF. Pt stated that the facility had bed bugs and cock roaches. "I called EMS and they brought me to [facility] from that place." CSW will notify MD of pt's statement and desire to placed at an ALF.

## 2011-07-14 NOTE — ED Notes (Signed)
Chaplain here to speak with pt 

## 2011-07-14 NOTE — ED Notes (Signed)
CSW notified by [facility] that pt may possible be accepted tomorrow. Writer will pass along to Weekday CSW to follow up. All information has been submitted and referred out for ALF placement. FL2 completed as well.

## 2011-07-14 NOTE — ED Notes (Signed)
Writer spoke with Tresa Endo (CSW) who stated that pt is needing homeless shelter placement oppose to ALF. CSW contacted [facility], [facility], and [facility] in [place] for bed inquiries. All facilities have no vacancies at this time. Pt is unable to return to the [facility] and [facility] requires applicants to apply for a bed Monday-Friday. CSW will continue to explore other surrounding areas for shelter placement.

## 2011-07-14 NOTE — ED Provider Notes (Addendum)
Pt awaiting SW eval this morning to attempt placement for him.  Ernest Christen, MD 07/14/11 [phone]  Social work has been unable to find a shelter for this patient at this time.  Patient would like to trying to an assisted-living facility and as he is 71 years old and does use a wheelchair as well we will attempt to find placement.  The daughters that the social workers contactable not have somebody for intake tomorrow if place and they will be followed tomorrow patient will have to be discharged to a shelter.  He has requested narcotics for headache which I will not give him at this time as he does not take them as an outpatient as he would proclaim.  He can have Tylenol and ibuprofen.  I have written him for one dose of Phenergan and Benadryl IM as well the  Ernest Christen, MD 07/14/11 1304

## 2011-07-15 LAB — GLUCOSE, CAPILLARY
Glucose-Capillary: 165 mg/dL — ABNORMAL HIGH (ref 70–99)
Glucose-Capillary: 161 mg/dL — ABNORMAL HIGH (ref 70–99)

## 2011-07-15 MED ORDER — GLIPIZIDE 10 MG PO TABS
10.0000 mg | ORAL_TABLET | Freq: Two times a day (BID) | ORAL | Status: DC
  Administered 2011-07-15 – 2011-07-18 (×7): 10 mg via ORAL
  Filled 2011-07-15 (×10): qty 1

## 2011-07-15 MED ORDER — ZOLPIDEM TARTRATE 5 MG PO TABS
5.0000 mg | ORAL_TABLET | Freq: Every evening | ORAL | Status: DC | PRN
  Administered 2011-07-15 – 2011-07-16 (×2): 5 mg via ORAL
  Filled 2011-07-15 (×2): qty 1

## 2011-07-15 MED ORDER — OXYCODONE HCL 10 MG PO TB12
10.0000 mg | ORAL_TABLET | Freq: Two times a day (BID) | ORAL | Status: DC
  Administered 2011-07-15 – 2011-07-18 (×7): 10 mg via ORAL
  Filled 2011-07-15 (×7): qty 1

## 2011-07-15 MED ORDER — NICOTINE 21 MG/24HR TD PT24
21.0000 mg | MEDICATED_PATCH | Freq: Every day | TRANSDERMAL | Status: DC
  Administered 2011-07-15 – 2011-07-18 (×5): 21 mg via TRANSDERMAL
  Filled 2011-07-15 (×4): qty 1

## 2011-07-15 MED ORDER — METFORMIN HCL 500 MG PO TABS
500.0000 mg | ORAL_TABLET | Freq: Two times a day (BID) | ORAL | Status: DC
  Administered 2011-07-15 – 2011-07-18 (×7): 500 mg via ORAL
  Filled 2011-07-15 (×12): qty 1

## 2011-07-15 NOTE — ED Notes (Signed)
Attempted to draw etoh level, pt jumped as this nurse stuck pt, pt then states "take it out."  Pt made aware that this nurse will send someone else to draw blood.

## 2011-07-15 NOTE — Discharge Planning (Signed)
Francis Dowse at [facility] called to confirm recpt of pt's ppwork. Joey advised a BAC is needed for pt.  CSW notified pt's nurse.  Manson Passey Lucia Mccreadie ANN S , MSW, LCSWA 07/15/2011  12:43 PM [phone]

## 2011-07-15 NOTE — Progress Notes (Signed)
Follow up with pt.  Provided support around discharge plan, resources, emotional turmoil, family issues.  Pt hopeful that he will be admitted to [facility] and have access to resources and benefits.  Pt is long-time AA member who was unable to go due to transportation issues.   Pt has little family support.   Chaplain provided spiritual and emotional support and prayed with pt.    Belva Crome  MDiv, Chaplain

## 2011-07-15 NOTE — ED Notes (Signed)
Pt inquiring why we are drawing his BAL again, pt notified that it was ordered and that for him to be placed (ie: [facility]) his BAL needs to be re-checked.

## 2011-07-15 NOTE — ED Notes (Signed)
CSW was notified by daytime ED CSW that pt is currently pending placement at the [facility]. CSW to continue following for placement.

## 2011-07-15 NOTE — ED Provider Notes (Addendum)
BP 134/71  Pulse 56  Temp(Src) 98.1 F (36.7 C) (Oral)  Resp 18  SpO2 93%  Awaiting placement. Glu elevated, will write for home doses of diabetic medications-- metformin, glipizide. O2 sat as above. CTAB. Not c/o SOB or chest pain. CO chronic b/l UE and LE pain for which he takes oxycontin at home (per pt)  [name], MD 07/15/11 8119  [name], MD 07/15/11 1478  [name], MD 07/15/11 [phone]

## 2011-07-15 NOTE — ED Notes (Signed)
Pt had an episode of urinary incontinence. Pt cleaned, bed linens changed and pt given new paper scrubs. Pt repositioned in bed.  Pt in no apparent distress and awaiting further plan of care.  No areas of breakdown noted to patient

## 2011-07-15 NOTE — ED Notes (Signed)
MD at bedside to assess patient.

## 2011-07-15 NOTE — ED Notes (Signed)
Mary Phlebotomist at bedside to draw blood

## 2011-07-15 NOTE — ED Notes (Signed)
Patient stated that he "wished he had his pistol that they took in [place]" to use on the male sitter sitting with room 26.

## 2011-07-15 NOTE — ED Notes (Signed)
The sitter Maxie Barb. Also heard the statement from the patient that he wished he had his pistol from [place] to use on Donnie.

## 2011-07-16 LAB — GLUCOSE, CAPILLARY: Glucose-Capillary: 221 mg/dL — ABNORMAL HIGH (ref 70–99)

## 2011-07-16 NOTE — ED Notes (Addendum)
Patient denies pain and is resting comfortably. Patient also refused bath at this time.

## 2011-07-16 NOTE — Discharge Planning (Signed)
CSW spoke with Joey, Intake coordinator at [facility] who advised they are on diversion at this time and no discharges are projected at this time.  He is checking [facility] to see if they are accepting patients at this time and will let us know.  Manson Passey Ellysia Char ANN S , MSW, LCSWA 07/16/2011 9:57 AM [phone]

## 2011-07-16 NOTE — Progress Notes (Signed)
Follow up with pt for continued support.  Pt resting comfortably in room, appropriate speech.  Conversed with chaplain about goals for continued care.    Will continue to follow and provide support during ED admission.    07/16/11 1800  Clinical Encounter Type  Visited With Patient  Visit Type Follow-up;Psychological support;Spiritual support;Social support  Spiritual Encounters  Spiritual Needs Emotional;Other (Comment) (support around goals for continued care)  Stress Factors  Patient Stress Factors Family relationships;Financial concerns;Lack of caregivers

## 2011-07-16 NOTE — ED Provider Notes (Signed)
The patient is resting comfortably, awakens fully with minimal stimulation.  The patient notes that he is trying to get to a rehabilitation facility, and is tired of being in his condition.  He denies new complaints.  States that his chronic pain is persistent.  Vital signs are unremarkable.  The patient is awaiting placement.  Gerhard Munch, MD 07/16/11 1547

## 2011-07-17 NOTE — ED Notes (Signed)
Patient blood glucose is 179

## 2011-07-17 NOTE — Progress Notes (Addendum)
Follow up with pt.    Pt feeling frustrated about lack of normalcy - not having showered in 5 days, his possessions being at [facility], not being able to walk, as his cane is at [employer].  Pt reports he should be walking and have his tennis shoes on due to his diabetes. Chaplain provided emotional support and discussed with pt elements of "normal" for him and ways he could claim some normalcy in this setting.  Worked with pt on understanding of process and engaging agency in waiting for a bed at [place].    Chaplain called [employer], who reported that they had been contacted by someone from [facility] about pt's belongings and they arranged to have them transported to him at the hospitial tomorrow.   Pt concerned about the charger for his electric wheelchair, which he reports was "on top of the piano in the chapel room."  Chaplain called [employer] to check if charger was still there.  [employer] reports charger is not on top of piano and hopes it is with pt's belongings.   Reported this to pt.   Chaplain delivered murder mystery book to pt, who loves to read and write.    Will continue to follow.

## 2011-07-17 NOTE — BH Assessment (Deleted)
This assessment/evaluation/note was completed by a Child psychotherapist of Social Work Warden/ranger and as Investment banker, corporate, I was immediately available for consultation/collaboration. I am in agreement with the contents and disposition reflected in the assessment/evaluation/note(s).   Manson Passey Ayven Pheasant ANN S , MSW, LCSWA 07/17/2011 12:03 PM [phone]

## 2011-07-17 NOTE — ED Notes (Signed)
Pt speaking with chaplin and he spoke with the [facility] and they will bring his stuff tomorrow to him.  He has calmed down a lot since then.  The [facility] will follow up on that tomorrow also and assure they bring his stuff.  He also wanted the social security office called which I did but they closed at 3:00 so he will hav to call tomorrow.  He has gone back to his room

## 2011-07-17 NOTE — BH Assessment (Signed)
Assessment Note   Ernest [name] is an 71 y.o. male current in [place] following recent d/c from [facility] to homeless shelter in [place]. Patient states that he has been evicted from shelter due to drinking alcohol. On admission- patient presented with intoxication and had been combative with staff. He is currently alert, oriented and calm.States that depression is predominant and he feels very stressed.  Today he denies any suicidal ideation but reported SI in recent past. He denies HI, AVHV and states that he is most worried about his belongs which are still at the [facility]- including his Consulting civil engineer for his electric wheelchair and his clothes.  Patient denies any true family support; he is very proud to be a [place] [name] and he identifies with this part of his life.  Patient wants to return to the [facility]; he has been in an ALF in the past but left due to his reports of bugs and filth. "I couldn't live there anymore."  Patient is currently on wait list for [facility] and states he will go to any [place] that is available.  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 Diabetes mellitus   . Hypertension   . Neuropathy    Axis IV: economic problems, housing problems, other psychosocial or environmental problems, problems related to social environment, problems with access to health care services and problems with primary support group Axis V: 41-50 mild symptoms  Past Medical History:  Past Medical History  Diagnosis Date  . Diabetes mellitus   . Hypertension   . Neuropathy     History reviewed. No pertinent past surgical history.  Family History: History reviewed. No pertinent family history.  Social History:  reports that he has been smoking Cigarettes.  He has been smoking about 2 packs per day. He does not have any smokeless tobacco history on file. He reports that he drinks about 2.4  ounces of alcohol per week. His drug history not on file.  Additional Social History:  Alcohol / Drug Use History of alcohol / drug use?: Yes Substance #1 Name of Substance 1: ETOH 1 - Age of First Use: 20s 1 - Amount (size/oz): varies 1 - Frequency: ocassionally  1 - Duration: years 1 - Last Use / Amount: 07/12/11- 4 beers Allergies: No Known Allergies  Home Medications:  Medications Prior to Admission  Medication Dose Route Frequency Provider Last Rate Last Dose  . acetaminophen (TYLENOL) 325 MG tablet        650 mg at 07/13/11 2319  . acetaminophen (TYLENOL) tablet 650 mg  650 mg Oral Q6H PRN [name], MD   650 mg at 07/16/11 0845  . diphenhydrAMINE (BENADRYL) injection 25 mg  25 mg Intramuscular Once [name], MD      . glipiZIDE (GLUCOTROL) tablet 10 mg  10 mg Oral BID AC [name], MD   10 mg at 07/16/11 1722  . HYDROcodone-acetaminophen (NORCO) 5-325 MG per tablet 1 tablet  1 tablet Oral Once [name], MD   1 tablet at 07/13/11 0145  . ibuprofen (ADVIL,MOTRIN) tablet 600 mg  600 mg Oral Q6H PRN [name], MD   600 mg at 07/17/11 8119  . LORazepam (ATIVAN) 2 MG/ML injection        2 mg at 07/13/11 0450  . LORazepam (ATIVAN) tablet 1 mg  1 mg Oral Once [name], MD   1  mg at 07/14/11 1311  . metFORMIN (GLUCOPHAGE) tablet 500 mg  500 mg Oral BID [name], MD   500 mg at 07/16/11 2207  . nicotine (NICODERM CQ - dosed in mg/24 hours) patch 21 mg  21 mg Transdermal Daily [name], MD   21 mg at 07/16/11 1057  . oxyCODONE (OXYCONTIN) 12 hr tablet 10 mg  10 mg Oral Q12H [name], MD   10 mg at 07/16/11 2145  . promethazine (PHENERGAN) injection 25 mg  25 mg Intramuscular Once [name], MD      . traMADol Janean Sark) tablet 50 mg  50 mg Oral Once [name], MD   50 mg at 07/12/11 2327  . zolpidem (AMBIEN) tablet 5 mg  5 mg Oral QHS PRN [name], MD   5 mg at 07/16/11 2145   Medications Prior to Admission  Medication  Sig Dispense Refill  . aspirin 81 MG tablet Take 81 mg by mouth daily.      . fish oil-omega-3 fatty acids 1000 MG capsule Take 2 g by mouth daily.      Marland Kitchen glipiZIDE (GLUCOTROL) 10 MG tablet Take 10 mg by mouth 2 (two) times daily before a meal.      . Menthol, Topical Analgesic, (BIOFREEZE ROLL-ON EX) Apply topically.      . metFORMIN (GLUCOPHAGE) 500 MG tablet Take 500 mg by mouth 2 (two) times daily with a meal.      . oxyCODONE (OXYCONTIN) 10 MG 12 hr tablet Take 10 mg by mouth as needed.        OB/GYN Status:  No LMP for male patient.  General Assessment Data [facility] ACT Assessment: Yes Living Arrangements: Shelter;Homeless Can pt return to current living arrangement?: No Admission Status: Voluntary Is patient capable of signing voluntary admission?: Yes Transfer from: Acute Hospital Referral Source: Self/Family/Friend  Education Status Is patient currently in school?: No Current Grade: NA Highest grade of school patient has completed: NA Name of school: NA Contact person: NA  Risk to self Suicidal Ideation: No-Not Currently/Within Last 6 Months (Denies today) Suicidal Intent: No Is patient at risk for suicide?: No Suicidal Plan?: No-Not Currently/Within Last 6 Months Access to Means: No Specify Access to Suicidal Means: Access to objects What has been your use of drugs/alcohol within the last 12 months?:  (ETOH) Previous Attempts/Gestures: No (Denies any past attempts) How many times?:  (NA) Triggers for Past Attempts: None known Intentional Self Injurious Behavior: None Family Suicide History: No Recent stressful life event(s): Other (Comment) (Recent homelessness; no family support) Persecutory voices/beliefs?: No Depression: Yes Depression Symptoms: Fatigue;Loss of interest in usual pleasures;Feeling worthless/self pity Substance abuse history and/or treatment for substance abuse?: Yes Suicide prevention information given to non-admitted  patients: Not applicable  Risk to Others Homicidal Ideation: No Thoughts of Harm to Others: No Current Homicidal Intent: No Current Homicidal Plan: No Access to Homicidal Means: No Identified Victim: NA History of harm to others?: No Assessment of Violence: On admission Violent Behavior Description:  (Hit staff; agitated and verbally abusive on admit- calm now) Does patient have access to weapons?: No Criminal Charges Pending?: No Does patient have a court date: No  Psychosis Hallucinations: None noted Delusions: None noted  Mental Status Report Appear/Hygiene: Disheveled Eye Contact: Good Motor Activity: Unremarkable Speech: Logical/coherent Level of Consciousness: Alert Mood: Depressed;Sad Affect: Depressed;Sad Anxiety Level: Minimal (Worried about personal belongings and Oceanographer) Thought Processes: Coherent;Relevant Judgement: Impaired Orientation: Person;Place;Time;Situation Obsessive Compulsive Thoughts/Behaviors:  None  Cognitive Functioning Concentration: Normal Memory: Recent Intact;Remote Intact IQ: Average Insight: Fair Impulse Control: Poor Appetite: Fair (Complaints about food quality) Weight Loss: 0  Weight Gain: 0  Sleep: Decreased Total Hours of Sleep:  (5-6 cant sleep in shelter) Vegetative Symptoms: None  Prior Inpatient Therapy Prior Inpatient Therapy: Yes (Denies) Prior Therapy Dates:  (Pt unsure of dates) Prior Therapy Facilty/Provider(s): [facility] Reason for Treatment:  (Depression)  Prior Outpatient Therapy Prior Outpatient Therapy: No (Patient denies) Prior Therapy Dates:  (NA) Prior Therapy Facilty/Provider(s):  (NA) Reason for Treatment:  (na)  ADL Screening (condition at time of admission) Patient's cognitive ability adequate to safely complete daily activities?: Yes Patient able to express need for assistance with ADLs?: Yes Communication: Independent Dressing (OT): Independent Grooming: Independent Feeding:  Independent Bathing: Independent Toileting: Independent In/Out Bed: Independent Walks in Home: Independent with device (comment) (Cane or mobilized chair) Weakness of Arms/Hands: None  Home Assistive Devices/Equipment Home Assistive Devices/Equipment: Cane (Mobilized chair)  Therapy Consults (therapy consults require a physician order) PT Evaluation Needed: No OT Evalulation Needed: No SLP Evaluation Needed: No Abuse/Neglect Assessment (Assessment to be complete while patient is alone) Physical Abuse: Denies Verbal Abuse: Denies Sexual Abuse: Denies Exploitation of patient/patient's resources: Denies Self-Neglect: Denies Values / Beliefs Cultural Requests During Hospitalization: None Spiritual Requests During Hospitalization: None Consults Spiritual Care Consult Needed: No Social Work Consult Needed: No   Nutrition Screen Diet: Carb modified  Additional Information 1:1 In Past 12 Months?: No CIRT Risk: No Elopement Risk: No Does patient have medical clearance?: Yes     Disposition:  Disposition Disposition of Patient: Other dispositions Other disposition(s): Referred to outside facility Patient referred to: Other (Comment) ([facility])  On Site Evaluation by:   Reviewed with Physician:     Darylene Price  BSW SW  Intern4/06/2011 11:27 AM

## 2011-07-17 NOTE — BH Assessment (Deleted)
Assessment Note   Ernest [name] is an 71 y.o. male.[facility] in [facility] following recent d/c from [facility] to homeless shelter in [place]. Patient states that he has been evicted from shelter due to drinking alcohol. On admission- patient presented with intoxication and had been combative with staff. He is currently alert, oriented and calm.States that depression is predominant and he feels very stressed. Continues to endorse thoughts of SI. Vague plan.  He denies HI, AVHV and states that he is most worried about his belongs which are still at the [facility]- including his Consulting civil engineer for his electric wheelchair and his clothes. Patient denies any true family support; he is very proud to be a [place] [name] and he identifies with this part of his life.Patient in need of psych stabilization for depressive symptoms.  Axis I: Major Depression, Recurrent severe Axis II: Deferred Axis III:  Past Medical History  Diagnosis Date  . Diabetes mellitus   . Hypertension   . Neuropathy    Axis IV: economic problems, housing problems, other psychosocial or environmental problems, problems related to social environment, problems with access to health care services and problems with primary support group Axis V: 41-50 serious symptoms  Past Medical History:  Past Medical History  Diagnosis Date  . Diabetes mellitus   . Hypertension   . Neuropathy     History reviewed. No pertinent past surgical history.  Family History: History reviewed. No pertinent family history.  Social History:  reports that he has been smoking Cigarettes.  He has been smoking about 2 packs per day. He does not have any smokeless tobacco history on file. He reports that he drinks about 2.4 ounces of alcohol per week. His drug history not on file.  Additional Social History:  Alcohol / Drug Use History of alcohol / drug use?: Yes Substance #1 Name of Substance 1: ETOH 1 - Age of First Use: 20s 1 - Amount (size/oz):  varies 1 - Frequency: ocassionally  1 - Duration: years 1 - Last Use / Amount: 07/12/11- 4 beers Allergies: No Known Allergies  Home Medications:  Medications Prior to Admission  Medication Dose Route Frequency Provider Last Rate Last Dose  . acetaminophen (TYLENOL) 325 MG tablet        650 mg at 07/13/11 2319  . acetaminophen (TYLENOL) tablet 650 mg  650 mg Oral Q6H PRN [name], MD   650 mg at 07/16/11 0845  . diphenhydrAMINE (BENADRYL) injection 25 mg  25 mg Intramuscular Once [name], MD      . glipiZIDE (GLUCOTROL) tablet 10 mg  10 mg Oral BID AC [name], MD   10 mg at 07/17/11 1204  . HYDROcodone-acetaminophen (NORCO) 5-325 MG per tablet 1 tablet  1 tablet Oral Once [name], MD   1 tablet at 07/13/11 0145  . ibuprofen (ADVIL,MOTRIN) tablet 600 mg  600 mg Oral Q6H PRN [name], MD   600 mg at 07/17/11 1610  . LORazepam (ATIVAN) 2 MG/ML injection        2 mg at 07/13/11 0450  . LORazepam (ATIVAN) tablet 1 mg  1 mg Oral Once [name], MD   1 mg at 07/14/11 1311  . metFORMIN (GLUCOPHAGE) tablet 500 mg  500 mg Oral BID [name], MD   500 mg at 07/17/11 1156  . nicotine (NICODERM CQ - dosed in mg/24 hours) patch 21 mg  21 mg Transdermal Daily [name], MD   21 mg at 07/17/11 1159  .  oxyCODONE (OXYCONTIN) 12 hr tablet 10 mg  10 mg Oral Q12H [name], MD   10 mg at 07/17/11 1158  . promethazine (PHENERGAN) injection 25 mg  25 mg Intramuscular Once [name], MD      . traMADol Janean Sark) tablet 50 mg  50 mg Oral Once [name], MD   50 mg at 07/12/11 2327  . zolpidem (AMBIEN) tablet 5 mg  5 mg Oral QHS PRN [name], MD   5 mg at 07/16/11 2145   Medications Prior to Admission  Medication Sig Dispense Refill  . aspirin 81 MG tablet Take 81 mg by mouth daily.      . fish oil-omega-3 fatty acids 1000 MG capsule Take 2 g by mouth daily.      Marland Kitchen glipiZIDE (GLUCOTROL) 10 MG tablet Take 10 mg by mouth 2 (two) times daily  before a meal.      . Menthol, Topical Analgesic, (BIOFREEZE ROLL-ON EX) Apply topically.      . metFORMIN (GLUCOPHAGE) 500 MG tablet Take 500 mg by mouth 2 (two) times daily with a meal.      . oxyCODONE (OXYCONTIN) 10 MG 12 hr tablet Take 10 mg by mouth as needed.        OB/GYN Status:  No LMP for male patient.  General Assessment Data [facility] ACT Assessment: Yes Living Arrangements: Shelter;Homeless Can pt return to current living arrangement?: No Admission Status: Voluntary Is patient capable of signing voluntary admission?: Yes Transfer from: Acute Hospital Referral Source: Self/Family/Friend  Education Status Is patient currently in school?: No Current Grade: NA Highest grade of school patient has completed: NA Name of school: NA Contact person: NA  Risk to self Suicidal Ideation: Yes-Currently Present (Denies today) Suicidal Intent: Yes-Currently Present Is patient at risk for suicide?: Yes Suicidal Plan?: No-Not Currently/Within Last 6 Months Access to Means: Yes Specify Access to Suicidal Means: Access to objects What has been your use of drugs/alcohol within the last 12 months?:  (ETOH) Previous Attempts/Gestures: No (Denies any past attempts) How many times?:  (NA) Triggers for Past Attempts: None known Intentional Self Injurious Behavior: None Family Suicide History: No Recent stressful life event(s): Other (Comment) (Recent homelessness; no family support) Persecutory voices/beliefs?: No Depression: Yes Depression Symptoms: Fatigue;Loss of interest in usual pleasures;Feeling worthless/self pity Substance abuse history and/or treatment for substance abuse?: Yes Suicide prevention information given to non-admitted patients: Not applicable  Risk to Others Homicidal Ideation: No Thoughts of Harm to Others: No Current Homicidal Intent: No Current Homicidal Plan: No Access to Homicidal Means: No Identified Victim: NA History of harm to  others?: No Assessment of Violence: On admission Violent Behavior Description:  (Hit staff; agitated and verbally abusive on admit- calm now) Does patient have access to weapons?: No Criminal Charges Pending?: No Does patient have a court date: No  Psychosis Hallucinations: None noted Delusions: None noted  Mental Status Report Appear/Hygiene: Disheveled Eye Contact: Good Motor Activity: Unremarkable Speech: Logical/coherent Level of Consciousness: Alert Mood: Depressed;Sad Affect: Depressed;Sad Anxiety Level: Minimal (Worried about personal belongings and Oceanographer) Thought Processes: Coherent;Relevant Judgement: Impaired Orientation: Person;Place;Time;Situation Obsessive Compulsive Thoughts/Behaviors: None  Cognitive Functioning Concentration: Normal Memory: Recent Intact;Remote Intact IQ: Average Insight: Fair Impulse Control: Poor Appetite: Fair (Complaints about food quality) Weight Loss: 0  Weight Gain: 0  Sleep: Decreased Total Hours of Sleep:  (5-6 cant sleep in shelter) Vegetative Symptoms: None  Prior Inpatient Therapy Prior Inpatient Therapy: Yes (Denies) Prior Therapy Dates:  (  Pt unsure of dates) Prior Therapy Facilty/Provider(s): [facility] Reason for Treatment:  (Depression)  Prior Outpatient Therapy Prior Outpatient Therapy: No (Patient denies) Prior Therapy Dates:  (NA) Prior Therapy Facilty/Provider(s):  (NA) Reason for Treatment:  (na)  ADL Screening (condition at time of admission) Patient's cognitive ability adequate to safely complete daily activities?: Yes Patient able to express need for assistance with ADLs?: Yes Communication: Independent Dressing (OT): Independent Grooming: Independent Feeding: Independent Bathing: Independent Toileting: Independent In/Out Bed: Independent Walks in Home: Independent with device (comment) (Cane or mobilized chair) Weakness of Arms/Hands: None  Home Assistive Devices/Equipment Home  Assistive Devices/Equipment: Cane (Mobilized chair)  Therapy Consults (therapy consults require a physician order) PT Evaluation Needed: No OT Evalulation Needed: No SLP Evaluation Needed: No Abuse/Neglect Assessment (Assessment to be complete while patient is alone) Physical Abuse: Denies Verbal Abuse: Denies Sexual Abuse: Denies Exploitation of patient/patient's resources: Denies Self-Neglect: Denies Values / Beliefs Cultural Requests During Hospitalization: None Spiritual Requests During Hospitalization: None Consults Spiritual Care Consult Needed: No Social Work Consult Needed: No   Nutrition Screen Diet: Carb modified  Additional Information 1:1 In Past 12 Months?: No CIRT Risk: No Elopement Risk: No Does patient have medical clearance?: Yes     Disposition:  Disposition Disposition of Patient: Other dispositions Other disposition(s): Referred to outside facility Patient referred to: Other (Comment) ([facility])  On Site Evaluation by:   Reviewed with Physician:     Marlaine Hind ANN S 07/17/2011 3:09 PM

## 2011-07-17 NOTE — ED Notes (Signed)
Gave patient milk and a coke at his request

## 2011-07-18 LAB — GLUCOSE, CAPILLARY: Glucose-Capillary: 144 mg/dL — ABNORMAL HIGH (ref 70–99)

## 2011-07-18 MED ORDER — LORAZEPAM 2 MG/ML IJ SOLN: 1.0000 mg | Freq: Four times a day (QID) | INTRAMUSCULAR | Status: DC | PRN

## 2011-07-18 NOTE — ED Provider Notes (Signed)
Filed Vitals:   07/18/11 1333  BP: 128/78  Pulse: 60  Temp: 98.3 F (36.8 C)  Resp: 18   Patient well in a.m. But became increasingly agitated through day. Ativan ordered for agitation prn.   Hilario Quarry, MD 07/18/11 [phone]

## 2011-07-18 NOTE — ED Notes (Signed)
Report called to [place] in [place].

## 2011-07-18 NOTE — Progress Notes (Signed)
This assessment/evaluation/note was completed by a Child psychotherapist of Social Work Warden/ranger and as Investment banker, corporate, I was immediately available for consultation/collaboration. I am in agreement with the contents and disposition reflected in the assessment/evaluation/note(s).   Manson Passey Zacariah Belue ANN S , MSW, LCSWA 07/18/2011 11:50 AM [phone]

## 2011-07-18 NOTE — ED Notes (Signed)
Pt clothes brought from [facility] house multi large bags The procedure explained to pt that must be checked by security.Pt became  upset and insulted security yelling .positive reinforced  With some claming down pt wanted his bible and mobile w/c charger(was checked by mant) Pt clamed down siting in moblie w/cwith it plugged clam.Talking to ED director.

## 2011-07-18 NOTE — Progress Notes (Signed)
Follow up with pt on referral by nursing.    Pt's belongings arrived in ED.  Pt agitated, wishing to go through his belongings and retrieve some of this things - (underwear, journal, and especially charger for power wheelchair).  In Pt's presence, security opened pt's bag and found charger for pt's wheelchair.   Chaplain provided support for pt's frustration around not being able to access belongings.   Chaplain contacted Jacqulyn Bath, director of facilities, who informed that pt's belongings should be labeled and stored in decontamination room, due to risk for bed bugs.    Pt with continued frustration regarding charging power chair - wishing to charge chair in room.  Chaplain provided emotional support for pt.  Pt agitated and did not wish to speak with chaplain.  Chaplain remained presence on unit.  Social work and nursing providing support.    Belva Crome  MDiv    07/18/11 1500  Clinical Encounter Type  Visited With Patient;Health care provider  Visit Type Follow-up;Psychological support;Spiritual support;Social support;Behavioral Health;ED  Referral From Nurse  Consult/Referral To Nurse;Social work  Spiritual Encounters  Spiritual Needs Emotional  Stress Factors  Patient Stress Factors Health changes;Financial concerns;Family relationships;Lack of knowledge;Other (Comment)

## 2011-07-18 NOTE — Discharge Planning (Signed)
Patient has been accepted to the [facility] by Dr. Lily Peer. Nurse to call report to [phone] ext [phone].  Patient to be transported by from [facility] ED to [facility] Emergency Department.  Pt's nurse notified.  Manson Passey Tosha Belgarde ANN S , MSW, LCSWA 07/18/2011  2:01 PM [phone]

## 2011-07-18 NOTE — Progress Notes (Signed)
Spoke with social worker, Lupita Leash, about patient's need to get his "stuff". He said that [employer] would put his belongings on the street today if they are not removed from the house. Will contact other chaplain, Susy Frizzle, who has been working with the patient to see what we can do. This issue has happened before when the patient was hospitalized.  07/18/11 1000  Clinical Encounter Type  Visited With Health care provider  Visit Type Social support  Referral From Social work  Recommendations Follow up  Spiritual Encounters  Spiritual Needs Emotional  Stress Factors  Patient Stress Factors Other (Comment) (Belongings)

## 2011-07-18 NOTE — Progress Notes (Signed)
Patient has been accepted by the [facility] in [place], [place]. Transportation arranged with [employer]. EDP and nursing will be notified and final d/c will be coordinated.  Bradly Bienenstock, SW Intern 07/18/2011 3:28 PM

## 2011-07-18 NOTE — ED Notes (Signed)
Pt calm sitting in hallway quietly. Pt refusing ativan stating, "I am fine I just need to be left alone..Thank you."  Donell Beers, RN given update

## 2011-07-18 NOTE — Progress Notes (Signed)
Current bed search in [facility] has thus far been unsuccessful. Bed search extended to [place], [place], [place] and [place] counties. Discussed with patient who verbalized understanding and agreement with this.  He still hopes to go to the [facility] in [place] or any other [facility].  Call received from [facility] in [place] [phone]). She is interested in patient but feels that she needs to come and meet/talk with patient prior to making final bed offer. She states that she has a facility in [place], [place] which is an all male facility where she feels he would be a good match. She will try to come visit patient this afternoon- ASAP. Will continue bed search for ALF's or [facility]- for first available bed.  Bradly Bienenstock, SW Intern 07/18/2011 11:48 AM

## 2011-07-18 NOTE — ED Notes (Signed)
Patients belongings are in the decontamination room. All this belongings are in red bio hazard bags with labels on them. There is 4 bags.

## 2011-07-18 NOTE — ED Notes (Signed)
Pt has been accepted to the [place] by Dr. Lily Peer. EDP notified and is in agreement. RN has called report. Pt is being transported by [employer]. Pt's belongings were placed in the [facility] [employer] by [employer]. No further needs at this time.

## 2011-07-19 NOTE — Progress Notes (Signed)
This assessment/evaluation/note was completed by a Child psychotherapist of Social Work Warden/ranger and as Investment banker, corporate, I was immediately available for consultation/collaboration. I am in agreement with the contents and disposition reflected in the assessment/evaluation/note(s).  Manson Passey Dannia Snook ANN S , MSW, LCSWA 07/19/2011 7:24 AM [phone]

## 2011-08-26 ENCOUNTER — Emergency Department (HOSPITAL_COMMUNITY)
Admission: EM | Admit: 2011-08-26 | Discharge: 2011-08-27 | Disposition: A | Discharge status: Home Health | Payer: Medicare Other | Attending: Emergency Medicine | Admitting: Emergency Medicine

## 2011-08-26 ENCOUNTER — Encounter (HOSPITAL_COMMUNITY): Payer: Self-pay | Admitting: Emergency Medicine

## 2011-08-26 DIAGNOSIS — G629 Polyneuropathy, unspecified: Secondary | ICD-10-CM

## 2011-08-26 DIAGNOSIS — IMO0001 Reserved for inherently not codable concepts without codable children: Secondary | ICD-10-CM | POA: Insufficient documentation

## 2011-08-26 DIAGNOSIS — E119 Type 2 diabetes mellitus without complications: Secondary | ICD-10-CM | POA: Insufficient documentation

## 2011-08-26 DIAGNOSIS — I10 Essential (primary) hypertension: Secondary | ICD-10-CM | POA: Insufficient documentation

## 2011-08-26 DIAGNOSIS — Z7982 Long term (current) use of aspirin: Secondary | ICD-10-CM | POA: Insufficient documentation

## 2011-08-26 DIAGNOSIS — Z79899 Other long term (current) drug therapy: Secondary | ICD-10-CM | POA: Insufficient documentation

## 2011-08-26 DIAGNOSIS — G609 Hereditary and idiopathic neuropathy, unspecified: Secondary | ICD-10-CM | POA: Insufficient documentation

## 2011-08-26 NOTE — ED Notes (Signed)
Pt alert, nad, arrives via EMS from jail, pt was under arrest for open container, brought to ER for pain, resp even unlabored, skin pwd

## 2011-08-26 NOTE — ED Notes (Signed)
Pt has been asking pts/family members in waiting room to wheel pt outside to smoke a cigarette. Pt has been taken outside 3 times to smoke. Pt in no distress while waiting.

## 2011-08-27 MED ORDER — ACETAMINOPHEN 500 MG PO TABS
1000.0000 mg | ORAL_TABLET | Freq: Once | ORAL | Status: AC
  Administered 2011-08-27: 1000 mg via ORAL
  Filled 2011-08-27: qty 2

## 2011-08-27 NOTE — ED Notes (Signed)
Pt came back to triage window and states that he's suicidal and needs to get to the [place], he was there and was arrested for open container and the judge threw it out. He states that all his clothes are at the [place] and he has nowhere to go.

## 2011-08-27 NOTE — ED Provider Notes (Signed)
History     CSN: [id number]  Arrival date & time 08/26/11  1924   First MD Initiated Contact with Patient 08/27/11 0029      Chief Complaint  Patient presents with  . Muscle Pain  . Suicidal    (Consider location/radiation/quality/duration/timing/severity/associated sxs/prior treatment) HPI Comments: 71 year old male with a history of diabetes hypertension and alcohol abuse who presents from the jail after being released today. He states that he was an inpatient psychiatric patient at the [facility] in [facility] when while smoking a cigarette outside he was picked up by a Emergency planning/management officer, told that he had outstanding warrant and was brought back to [place] for prosecution.  He spent 6 days in jail, states that the judge released him today and told him that he could get back to his inpatient psychiatric facility if he came to this hospital. The patient complains only of peripheral neuropathy which is a chronic complaint, states that he has been eating and drinking without any difficulties and he has been receiving his medications in the jail. He denies suicidal thoughts, depression, chest pain, shortness of breath, abdominal pain, nausea or vomiting.  Patient is a 71 y.o. male presenting with musculoskeletal pain. The history is provided by the patient and medical records.  Muscle Pain    Past Medical History  Diagnosis Date  . Diabetes mellitus   . Hypertension   . Neuropathy     History reviewed. No pertinent past surgical history.  No family history on file.  History  Substance Use Topics  . Smoking status: Current Everyday Smoker -- 2.0 packs/day    Types: Cigarettes  . Smokeless tobacco: Not on file  . Alcohol Use: 2.4 oz/week    4 Cans of beer per week     Pt reported that he dranked 4 beers      Review of Systems  All other systems reviewed and are negative.    Allergies  Review of patient's allergies indicates no known  allergies.  Home Medications   Current Outpatient Rx  Name Route Sig Dispense Refill  . ASPIRIN 81 MG PO TABS Oral Take 81 mg by mouth daily.    . OMEGA-3 FATTY ACIDS 1000 MG PO CAPS Oral Take 2 g by mouth daily.    Marland Kitchen GLIPIZIDE 10 MG PO TABS Oral Take 10 mg by mouth 2 (two) times daily before a meal.    . BIOFREEZE ROLL-ON EX Apply externally Apply topically.    Marland Kitchen METFORMIN HCL 500 MG PO TABS Oral Take 500 mg by mouth 2 (two) times daily with a meal.    . OXYCODONE HCL ER 10 MG PO TB12 Oral Take 10 mg by mouth as needed. For pain relief      BP 135/82  Pulse 59  Temp(Src) 97.1 F (36.2 C) (Oral)  Resp 16  SpO2 98%  Physical Exam  Nursing note and vitals reviewed. Constitutional: He appears well-developed and well-nourished. No distress.  HENT:  Head: Normocephalic and atraumatic.  Mouth/Throat: Oropharynx is clear and moist. No oropharyngeal exudate.  Eyes: Conjunctivae and EOM are normal. Pupils are equal, round, and reactive to light. Right eye exhibits no discharge. Left eye exhibits no discharge. No scleral icterus.  Neck: Normal range of motion. Neck supple. No JVD present. No thyromegaly present.  Cardiovascular: Normal rate, regular rhythm, normal heart sounds and intact distal pulses.  Exam reveals no gallop and no friction rub.   No murmur heard. Pulmonary/Chest: Effort normal and breath  sounds normal. No respiratory distress. He has no wheezes. He has no rales.  Abdominal: Soft. Bowel sounds are normal. He exhibits no distension and no mass. There is no tenderness.  Musculoskeletal: Normal range of motion. He exhibits no edema and no tenderness.  Lymphadenopathy:    He has no cervical adenopathy.  Neurological: He is alert. Coordination normal.  Skin: Skin is warm and dry. No rash noted. No erythema.  Psychiatric: He has a normal mood and affect. His behavior is normal.    ED Course  Procedures (including critical care time)  Labs Reviewed  GLUCOSE, CAPILLARY -  Abnormal; Notable for the following:    Glucose-Capillary 153 (*)    All other components within normal limits   No results found.   1. Neuropathy       MDM  The patient has a clear sensorium, vital signs reflecting mild hypertension, will attempt to disposition patient back to [place] where he wants to live. He denies any suicidal thoughts to me, admits to having ongoing chronic depression which he relates to his service in the [employer]. After he was discharged to the waiting room to find his own transport he told the nurse that he felt like he couldn't cope anymore and needed to have help getting to [place]. I reevaluated the patient and again found him to not be suicidal and only wanting a ride to get back to where he wants to be in [place]. We have arranged transport for him, he again states that he is doing well is very thankful and appreciative for the transport and denies suicidal thoughts.        Vida Roller, MD 08/27/11 [phone]

## 2011-08-27 NOTE — ED Notes (Signed)
Patient complains of joint pain. Patient also states that he needs go back to [facility] hospital in [place], [place] to finish his treatments.

## 2011-08-27 NOTE — ED Notes (Signed)
MD at bedside. Dr. Miller at bedside.  

## 2011-08-27 NOTE — ED Notes (Signed)
Charge nurse will call the social worker at 8am to come and help the patient

## 2011-08-27 NOTE — ED Notes (Signed)
Spoke with admission person at [place], [place]. Staff member stated that [name] completed his treatment at [facility]. Dr. Hyacinth Meeker notified.

## 2011-08-27 NOTE — ED Notes (Signed)
Dr. Hyacinth Meeker requests that social worker consult with pt to find transportation back to [facility].

## 2011-08-27 NOTE — ED Notes (Signed)
Patient discharge via wheelchair. Respirations equal and unlabored. Skin warm and dry. No acute distress noted. 

## 2011-08-27 NOTE — Discharge Instructions (Signed)
Call your doctor this morning, he should be seen within 48 hours for a followup. If you do not have a family Dr. see the list below.  RESOURCE GUIDE  Dental Problems  Patients with Medicaid: [facility] [id number]                                           [phone] [address] [employer] [address] Phone:  [phone]                                                  Phone:  [phone]  If unable to pay or uninsured, contact:  [facility] or [facility]. to become qualified for the adult dental clinic.  Chronic Pain Problems Contact [facility]  [phone] Patients need to be referred by their primary care doctor.  Insufficient Money for Medicine Contact United Way:  call "211" or [facility] [phone] 305-2692.  No Primary Care Doctor Call [facility]  [phone] Other agencies that provide inexpensive medical care    [facility]  [phone]    [facility] Internal Medicine  [phone]    [facility]  [phone]    [facility]  [phone]    [facility]  [phone]    [facility]  [phone]  Psychological Services [facility] Behavioral Health  [phone] [facility]  [phone] [facility] Mental Health   [phone] (emergency services 613-717-9838)  Substance Abuse Resources [facility] Services  856-242-7054 [facility] [phone] [facility] [place] 670-246-3517 Floydene Flock [phone] Residential & Outpatient Substance Abuse Program  [phone]  Abuse/Neglect [facility] Child Abuse Hotline 680-264-0282 [facility] Child Abuse Hotline [phone] (After Hours)  Emergency Shelter [facility] [phone]  Maternity Homes Room at the [place] of the [place] 314-748-9510 [facility] [phone]  MRSA Hotline #:   304-774-3660    [facility] Resources  [facility]      United Way                          [facility] Dept. [address] [place]                       [address]      [address] [place] [address]  [place]                                                Cristobal Goldmann Phone:  [phone]                                   Phone:  [phone]                 Phone:  [phone]  [facility] Mental Health Phone:  [phone]  [facility] Child Abuse Hotline [phone] (After Hours)

## 2012-07-02 ENCOUNTER — Emergency Department (HOSPITAL_COMMUNITY)
Admission: EM | Admit: 2012-07-02 | Discharge: 2012-07-06 | Disposition: A | Discharge status: Home / Self Care | Payer: Medicare Other | Attending: Emergency Medicine | Admitting: Emergency Medicine

## 2012-07-02 ENCOUNTER — Encounter (HOSPITAL_COMMUNITY): Payer: Self-pay | Admitting: *Deleted

## 2012-07-02 DIAGNOSIS — I10 Essential (primary) hypertension: Secondary | ICD-10-CM | POA: Insufficient documentation

## 2012-07-02 DIAGNOSIS — F1010 Alcohol abuse, uncomplicated: Secondary | ICD-10-CM | POA: Insufficient documentation

## 2012-07-02 DIAGNOSIS — F17200 Nicotine dependence, unspecified, uncomplicated: Secondary | ICD-10-CM | POA: Insufficient documentation

## 2012-07-02 DIAGNOSIS — Z79899 Other long term (current) drug therapy: Secondary | ICD-10-CM | POA: Insufficient documentation

## 2012-07-02 DIAGNOSIS — Z7982 Long term (current) use of aspirin: Secondary | ICD-10-CM | POA: Insufficient documentation

## 2012-07-02 DIAGNOSIS — E119 Type 2 diabetes mellitus without complications: Secondary | ICD-10-CM | POA: Insufficient documentation

## 2012-07-02 DIAGNOSIS — Z8669 Personal history of other diseases of the nervous system and sense organs: Secondary | ICD-10-CM | POA: Insufficient documentation

## 2012-07-02 DIAGNOSIS — F10929 Alcohol use, unspecified with intoxication, unspecified: Secondary | ICD-10-CM

## 2012-07-02 HISTORY — DX: Depression, unspecified: F32.A

## 2012-07-02 HISTORY — DX: Anxiety disorder, unspecified: F41.9

## 2012-07-02 HISTORY — DX: Major depressive disorder, single episode, unspecified: F32.9

## 2012-07-02 HISTORY — DX: Mental disorder, not otherwise specified: F99

## 2012-07-02 LAB — COMPREHENSIVE METABOLIC PANEL
ALT: 15 U/L (ref 0–53)
AST: 14 U/L (ref 0–37)
Albumin: 3.5 g/dL (ref 3.5–5.2)
Alkaline Phosphatase: 57 U/L (ref 39–117)
BUN: 8 mg/dL (ref 6–23)
CO2: 22 mEq/L (ref 19–32)
Calcium: 9.3 mg/dL (ref 8.4–10.5)
Chloride: 95 mEq/L — ABNORMAL LOW (ref 96–112)
Creatinine, Ser: 0.64 mg/dL (ref 0.50–1.35)
GFR calc Af Amer: >90 mL/min (ref >90)
GFR calc non Af Amer: >90 mL/min (ref >90)
Glucose, Bld: 95 mg/dL (ref 70–99)
Potassium: 4.4 mEq/L (ref 3.5–5.1)
Sodium: 129 mEq/L — ABNORMAL LOW (ref 135–145)
Total Bilirubin: 0.2 mg/dL — ABNORMAL LOW (ref 0.3–1.2)
Total Protein: 7.2 g/dL (ref 6.0–8.3)

## 2012-07-02 LAB — CBC
HCT: 38.9 % — ABNORMAL LOW (ref 39.0–52.0)
Hemoglobin: 12.5 g/dL — ABNORMAL LOW (ref 13.0–17.0)
MCH: 25.8 pg — ABNORMAL LOW (ref 26.0–34.0)
MCHC: 32.1 g/dL (ref 30.0–36.0)
MCV: 80.2 fL (ref 78.0–100.0)
Platelets: 291 10*3/uL (ref 150–400)
RBC: 4.85 MIL/uL (ref 4.22–5.81)
RDW: 16 % — ABNORMAL HIGH (ref 11.5–15.5)
WBC: 8.8 10*3/uL (ref 4.0–10.5)

## 2012-07-02 LAB — ETHANOL: Alcohol, Ethyl (B): 84 mg/dL — ABNORMAL HIGH (ref 0–11)

## 2012-07-02 LAB — SALICYLATE LEVEL: Salicylate Lvl: <2 mg/dL — ABNORMAL LOW (ref 2.8–20.0)

## 2012-07-02 LAB — ACETAMINOPHEN LEVEL: Acetaminophen (Tylenol), Serum: <15 ug/mL (ref 10–30)

## 2012-07-02 MED ORDER — VITAMIN B-1 100 MG PO TABS
100.0000 mg | ORAL_TABLET | Freq: Every day | ORAL | Status: DC
  Administered 2012-07-03 – 2012-07-06 (×4): 100 mg via ORAL
  Filled 2012-07-02 (×4): qty 1

## 2012-07-02 MED ORDER — LORAZEPAM 2 MG/ML IJ SOLN: 1.0000 mg | Freq: Four times a day (QID) | INTRAMUSCULAR | Status: AC | PRN

## 2012-07-02 MED ORDER — LORAZEPAM 1 MG PO TABS
1.0000 mg | ORAL_TABLET | Freq: Four times a day (QID) | ORAL | Status: AC | PRN
  Administered 2012-07-04: 1 mg via ORAL
  Filled 2012-07-02 (×2): qty 1

## 2012-07-02 MED ORDER — THIAMINE HCL 100 MG/ML IJ SOLN: 100.0000 mg | Freq: Every day | INTRAMUSCULAR | Status: DC

## 2012-07-02 MED ORDER — ADULT MULTIVITAMIN W/MINERALS CH
1.0000 | ORAL_TABLET | Freq: Every day | ORAL | Status: DC
  Administered 2012-07-03 – 2012-07-06 (×4): 1 via ORAL
  Filled 2012-07-02 (×4): qty 1

## 2012-07-02 MED ORDER — FOLIC ACID 1 MG PO TABS
1.0000 mg | ORAL_TABLET | Freq: Every day | ORAL | Status: DC
  Administered 2012-07-03 – 2012-07-06 (×4): 1 mg via ORAL
  Filled 2012-07-02 (×4): qty 1

## 2012-07-02 NOTE — ED Provider Notes (Signed)
History     CSN: [id number]  Arrival date & time 07/02/12  1641   First MD Initiated Contact with Patient 07/02/12 1702      Chief Complaint  Patient presents with  . Medical Clearance    (Consider location/radiation/quality/duration/timing/severity/associated sxs/prior treatment) HPI Comments: 72 y/o male presents to the ED via EMS from [facility] due to expressing SI/HI without a plan he is currently intoxicated and drank two 40 oz beers today. Patient is a level 5 caveat due to intoxication.  The history is provided by the EMS personnel. The history is limited by the condition of the patient.    Past Medical History  Diagnosis Date  . Diabetes mellitus   . Hypertension   . Neuropathy     History reviewed. No pertinent past surgical history.  History reviewed. No pertinent family history.  History  Substance Use Topics  . Smoking status: Current Every Day Smoker -- 2.00 packs/day    Types: Cigarettes  . Smokeless tobacco: Never Used  . Alcohol Use: 2.4 oz/week    4 Cans of beer per week     Comment: Pt reported that he dranked 4 beers      Review of Systems  Unable to perform ROS: Other    Allergies  Review of patient's allergies indicates no known allergies.  Home Medications   Current Outpatient Rx  Name  Route  Sig  Dispense  Refill  . aspirin 81 MG tablet   Oral   Take 81 mg by mouth daily.         . fish oil-omega-3 fatty acids 1000 MG capsule   Oral   Take 2 g by mouth daily.         Marland Kitchen glipiZIDE (GLUCOTROL) 10 MG tablet   Oral   Take 10 mg by mouth 2 (two) times daily before a meal.         . Menthol, Topical Analgesic, (BIOFREEZE ROLL-ON EX)   Apply externally   Apply topically.         . metFORMIN (GLUCOPHAGE) 500 MG tablet   Oral   Take 500 mg by mouth 2 (two) times daily with a meal.         . oxyCODONE (OXYCONTIN) 10 MG 12 hr tablet   Oral   Take 10 mg by mouth as needed. For pain relief           BP 111/71   Pulse 77  Temp(Src) 97.2 F (36.2 C) (Oral)  Resp 20  SpO2 94%  Physical Exam  Nursing note and vitals reviewed. Constitutional: He appears well-developed. No distress.  Intoxicated, unkempt, smells of ETOH  HENT:  Head: Normocephalic and atraumatic.  Mouth/Throat: Oropharynx is clear and moist.  Eyes: Conjunctivae are normal. Pupils are equal, round, and reactive to light. No scleral icterus.  Neck: Normal range of motion. Neck supple.  Cardiovascular: Normal rate, regular rhythm and normal heart sounds.   Pulmonary/Chest: Effort normal. No respiratory distress. He has wheezes (scattered expiratory ).  Abdominal: Soft. Bowel sounds are normal. There is no tenderness.  Musculoskeletal: Normal range of motion. He exhibits no edema.  Neurological: He is alert. He is disoriented (to time (states it is 66)).  Skin: Skin is warm and dry.    ED Course  Procedures (including critical care time)  Labs Reviewed  ACETAMINOPHEN LEVEL  CBC  COMPREHENSIVE METABOLIC PANEL  ETHANOL  SALICYLATE LEVEL  URINE RAPID DRUG SCREEN (HOSP PERFORMED)   No results found.  1. Alcohol intoxication       MDM  72 y/o male with SI/HI. Admits in the ED he has HI but no plan. Patient currently intoxicated. I feel patient is a harm to others and he has been IVC'd. I discussed this with Dr. Juleen China who agrees with plan of care. He is on CIWA protocol. Awaiting ACT team consult. 7:32 PM ACT team aware of patient.        Trevor Mace, PA-C 07/02/12 1932

## 2012-07-02 NOTE — ED Notes (Signed)
Pt refusing to remove clothes and have blood or urine taken. Pt is voluntary at this time, will monitor.

## 2012-07-02 NOTE — ED Notes (Signed)
Per EMS, pt from [facility] with reports of voicing SI/HI without plan. EMS reports that pt drank 2 "40 ounces" today as well.

## 2012-07-02 NOTE — ED Notes (Signed)
Pt belongings locked in cabinet in room 11. Pt has sitter

## 2012-07-03 LAB — GLUCOSE, CAPILLARY: Glucose-Capillary: 152 mg/dL — ABNORMAL HIGH (ref 70–99)

## 2012-07-03 MED ORDER — NICOTINE 21 MG/24HR TD PT24
21.0000 mg | MEDICATED_PATCH | Freq: Every day | TRANSDERMAL | Status: DC
Start: 2012-07-04 — End: 2012-07-06
  Administered 2012-07-04 – 2012-07-06 (×3): 21 mg via TRANSDERMAL
  Filled 2012-07-03 (×3): qty 1

## 2012-07-03 MED ORDER — ALUM & MAG HYDROXIDE-SIMETH 200-200-20 MG/5ML PO SUSP: 30.0000 mL | ORAL | Status: DC | PRN

## 2012-07-03 MED ORDER — ZOLPIDEM TARTRATE 5 MG PO TABS
5.0000 mg | ORAL_TABLET | Freq: Every evening | ORAL | Status: DC | PRN
  Administered 2012-07-05 (×2): 5 mg via ORAL
  Filled 2012-07-03 (×2): qty 1

## 2012-07-03 MED ORDER — LORAZEPAM 1 MG PO TABS
1.0000 mg | ORAL_TABLET | Freq: Three times a day (TID) | ORAL | Status: DC | PRN
  Administered 2012-07-05 – 2012-07-06 (×2): 1 mg via ORAL
  Filled 2012-07-03: qty 1

## 2012-07-03 MED ORDER — ACETAMINOPHEN 325 MG PO TABS
650.0000 mg | ORAL_TABLET | ORAL | Status: DC | PRN
  Administered 2012-07-05: 650 mg via ORAL
  Filled 2012-07-03 (×2): qty 2

## 2012-07-03 MED ORDER — ASPIRIN 81 MG PO CHEW
81.0000 mg | CHEWABLE_TABLET | Freq: Once | ORAL | Status: AC
  Administered 2012-07-03: 81 mg via ORAL
  Filled 2012-07-03: qty 1

## 2012-07-03 MED ORDER — ONDANSETRON HCL 4 MG PO TABS: 4.0000 mg | ORAL_TABLET | Freq: Three times a day (TID) | ORAL | Status: DC | PRN

## 2012-07-03 MED ORDER — IBUPROFEN 200 MG PO TABS
600.0000 mg | ORAL_TABLET | Freq: Three times a day (TID) | ORAL | Status: DC | PRN
  Administered 2012-07-04 – 2012-07-06 (×4): 600 mg via ORAL
  Filled 2012-07-03 (×4): qty 3

## 2012-07-03 NOTE — Clinical Social Work Note (Signed)
[facility]W contacted [facility], spoke to [place] [phone].  Riki Rusk is aware of the pt request.  He stated the [place] fax machine was down and is just now printing today's information.  Riki Rusk could not confirm the receipt of the fax.  He asked if we would follow up after 2pm re: referral if we have not heard back from him.    Vickii Penna, LCSWA [phone]  Clinical Social Work

## 2012-07-03 NOTE — ED Notes (Signed)
Patient requested to attempt ambulation. Patient refused, patient states he does not/can not walk.

## 2012-07-03 NOTE — ED Provider Notes (Signed)
Medical screening examination/treatment/procedure(s) were performed by non-physician practitioner and as supervising physician I was immediately available for consultation/collaboration.  Haille Pardi, MD 07/03/12 0040 

## 2012-07-03 NOTE — BHH Counselor (Signed)
This Clinical research associate contacted Ernest [name] at [facility], states that referral will not be reviewed until Monday.  [place] have no available beds until possibly Monday.  Ernest [name] unable to confirm if referral packet was received, he checked log and did not see any referral from [facility], however states that packet may be in another office.  This Clinical research associate will resubmit.

## 2012-07-03 NOTE — ED Notes (Signed)
Pt is not appropriate to move to psych ed due to incontinence. Act team is working on placement in [place].

## 2012-07-03 NOTE — ED Notes (Signed)
Patient willing to take previously refused vitamins.

## 2012-07-03 NOTE — BH Assessment (Signed)
Assessment Note   Ernest [name] is a 72 y.o. male WHO PRESENTS VOLUNTARILY WITH SI/HI.  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  REPORTEDLY CAME TO ED VIA EMS [facility] AND WAS INTOXICATED UPON ARRIVAL.  PT TOLD MEDICAL STAFF THAT HE CONSUMED 2-40 OZ BEERS TODAY. DURING ASSESSMENT, PT REPEATEDLY VERBALIZED THAT HE WAS A [place] VET, HONORABLY DISCHARGED AND HAS PTSD.  PT ALSO SAYS [facility] [place].  PT WOULD NOT PROVIDE ANY OTHER INFO AND TOLD THIS WRITER TO GET OUT, HE HAD A HEADACHE AND "TURN THE GODDAMN LIGHTS OFF".  PT TOLD THIS WRITER THAT I HAVE NO QUALIFICATIONS IN DIAGNOSING HIM OR "SOCIAL WORKING" HIM.  PT WOULD NOT DISCLOSE ANY ISSUES WITH HALLUCINATIONS OR DELUSIONAL THOUGHTS, NO FLASHBACKS.  PT IS PENDING [facility] FOR FINAL DISPOSITION.  Axis I: Mood Disorder NOS Axis II: Deferred Axis III:  Past Medical History  Diagnosis Date  . Diabetes mellitus   . Hypertension   . Neuropathy   . Mental disorder   . Depression   . Anxiety    Axis IV: other psychosocial or environmental problems, problems related to social environment and problems with primary support group Axis V: 41-50 serious symptoms  Past Medical History:  Past Medical History  Diagnosis Date  . Diabetes mellitus   . Hypertension   . Neuropathy   . Mental disorder   . Depression   . Anxiety     History reviewed. No pertinent past surgical history.  Family History: History reviewed. No pertinent family history.  Social History:  reports that he has been smoking Cigarettes.  He has been smoking about 2.00 packs per day. He has never used smokeless tobacco. He reports that he drinks about 2.4 ounces of alcohol per week. He reports that he does not use illicit drugs.  Additional Social History:  Alcohol / Drug Use Pain Medications: See MAR  Prescriptions: See MAR  Over the Counter: See MAR  History of alcohol / drug use?:  Yes Longest period of sobriety (when/how long): Unk  Negative Consequences of Use: Personal relationships Withdrawal Symptoms: Agitation;Irritability Substance #1 Name of Substance 1: Alcohol 1 - Age of First Use: Unk 1 - Amount (size/oz): 2-40's 1 - Frequency: Unk 1 - Duration: 1 Day 1 - Last Use / Amount: 07/02/12  CIWA: CIWA-Ar BP: 107/63 mmHg Pulse Rate: 76 Nausea and Vomiting: no nausea and no vomiting Tactile Disturbances: none Tremor: no tremor Auditory Disturbances: not present Paroxysmal Sweats: no sweat visible Visual Disturbances: not present Anxiety: no anxiety, at ease Headache, Fullness in Head: none present Agitation: normal activity Orientation and Clouding of Sensorium: oriented and can do serial additions CIWA-Ar Total: 0 COWS:    Allergies: No Known Allergies  Home Medications:  (Not in a hospital admission)  OB/GYN Status:  No LMP for male patient.  General Assessment Data [facility] Living Arrangements: Alone Can pt return to current living arrangement?: Yes Admission Status: Voluntary Is patient capable of signing voluntary admission?: Yes Transfer from: Acute Hospital Referral Source: MD  Education Status Is patient currently in school?: No Current Grade: None  Highest grade of school patient has completed: None  Name of school: None  Contact person: None   Risk to self Suicidal Ideation: Yes-Currently Present Suicidal Intent: No-Not Currently/Within Last 6 Months Is patient at risk for suicide?: Yes Suicidal Plan?: No-Not Currently/Within Last 6 Months Access to  Means: Yes Specify Access to Suicidal Means: Medications, Sharps  What has been your use of drugs/alcohol within the last 12 months?: 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; 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  Previous Attempts/Gestures: Yes How many times?:  (Thoughts only ) Other Self Harm Risks: None  Triggers for Past Attempts: Other (Comment) (PTSD([place]  vet)) Intentional Self Injurious Behavior: None Family Suicide History: No Recent stressful life event(s): Other (Comment) (None reported ) Persecutory voices/beliefs?: No Depression: Yes Depression Symptoms: Feeling angry/irritable Substance abuse history and/or treatment for substance abuse?: Yes Suicide prevention information given to non-admitted patients: Not applicable  Risk to Others Homicidal Ideation: Yes-Currently Present Thoughts of Harm to Others: Yes-Currently Present Comment - Thoughts of Harm to Others:  ("I will if I have to"--no addt'l info disclosed ) Current Homicidal Intent: No-Not Currently/Within Last 6 Months Current Homicidal Plan: No-Not Currently/Within Last 6 Months Access to Homicidal Means: No Identified Victim: None  History of harm to others?: No Assessment of Violence: None Noted Violent Behavior Description: Verbal aggression  Does patient have access to weapons?: No Criminal Charges Pending?: No Does patient have a court date: No  Psychosis Hallucinations: None noted (Denied ) Delusions: None noted  Mental Status Report Appear/Hygiene: Disheveled Eye Contact: Poor Motor Activity: Agitation Speech: Loud;Logical/coherent Level of Consciousness: Alert Mood: Angry;Irritable Affect: Irritable;Angry Anxiety Level: None Thought Processes: Coherent;Relevant Judgement: Impaired Orientation: Person;Place;Time;Situation Obsessive Compulsive Thoughts/Behaviors: None  Cognitive Functioning Concentration: Normal Memory: Recent Intact;Remote Intact IQ: Average Insight: Fair Impulse Control: Fair Appetite: Good Weight Loss: 0 Weight Gain: 0 Sleep: No Change Total Hours of Sleep:  (Unk ) Vegetative Symptoms: None  ADLScreening [facility] Assessment Services) Patient's cognitive ability adequate to safely complete daily activities?: Yes Patient able to express need for assistance with ADLs?: Yes Independently performs ADLs?: Yes (appropriate for  developmental age)  Abuse/Neglect [facility]) Physical Abuse: Denies Verbal Abuse: Denies Sexual Abuse: Denies  Prior Inpatient Therapy Prior Inpatient Therapy: Yes Prior Therapy Dates: 2005-2012 Prior Therapy Facilty/Provider(s): [facility] Reason for Treatment: Depression/SI  Prior Outpatient Therapy Prior Outpatient Therapy: Yes Prior Therapy Dates: Current  [facility]  Reason for Treatment: Med Mgt/ Therapy   ADL Screening (condition at time of admission) Patient's cognitive ability adequate to safely complete daily activities?: Yes Patient able to express need for assistance with ADLs?: Yes Independently performs ADLs?: Yes (appropriate for developmental age) Weakness of Legs: None Weakness of Arms/Hands: None  Home Assistive Devices/Equipment Home Assistive Devices/Equipment: None  Therapy Consults (therapy consults require a physician order) PT Evaluation Needed: No OT Evalulation Needed: No SLP Evaluation Needed: No Abuse/Neglect Assessment (Assessment to be complete while patient is alone) Physical Abuse: Denies Verbal Abuse: Denies Sexual Abuse: Denies Exploitation of patient/patient's resources: Denies Self-Neglect: Denies Values / Beliefs Cultural Requests During Hospitalization: None Spiritual Requests During Hospitalization: None Consults Spiritual Care Consult Needed: No Social Work Consult Needed: No Merchant navy officer (For Healthcare) Advance Directive: Patient does not have advance directive;Patient would not like information Pre-existing out of facility DNR order (yellow form or pink MOST form): No Nutrition Screen- [facility] Adult/[facility] Patient's home diet: Regular Have you recently lost weight without trying?: No Have you been eating poorly because of a decreased appetite?: No Malnutrition Screening Tool Score: 0  Additional Information 1:1 In Past 12 Months?: No CIRT Risk: No Elopement Risk: No Does patient have medical  clearance?: Yes     Disposition:  Disposition Initial Assessment Completed for this Encounter: Yes Disposition of Patient: Inpatient treatment program;Referred to (Pending Telepsych ) Type  of inpatient treatment program: Adult Patient referred to:  (Pending Telepsych )  On Site Evaluation by:   Reviewed with Physician:     Murrell Redden 07/03/2012 1:39 AM

## 2012-07-03 NOTE — ED Notes (Signed)
Patient Advocacy requested to bedside per patient request.

## 2012-07-03 NOTE — Clinical Social Work Note (Addendum)
10:13am- [facility]W called [facility] at [phone] and left a message at ext [phone].  CSW attempting to confirm receipt of fax and if anything else was needed.  CSW will continue these efforts.  CSW assessed pt at bedside.  Pt reports being depressed and SI.  Pt stated he has been this way for a while, but would not elaborate.  Pt is adamant about being transferred to the [facility]  CSW completed [id number] and Psychiatric transfer form and faxed it to the [place] (fax: [phone]) along with the following: MD assessment, telepsych assessment, vitals, demographics, labs, MAR.  Pt signed transfer request.  Rancour, MD signed medical clearance for pt to be transferred to [place].    Vickii Penna, LCSWA [phone]  Clinical Social Work

## 2012-07-03 NOTE — ED Provider Notes (Signed)
Patient reassessed this morning and denies complaints. Continues to endorse SI. Denies tremors or shakes. [facility] for [facility].  BP 99/47  Pulse 76  Temp(Src) 98.4 F (36.9 C) (Oral)  Resp 16  SpO2 92%   Glynn Octave, MD 07/03/12 1004

## 2012-07-03 NOTE — BHH Counselor (Signed)
Contacted the [facility] after hours transfer coordinator Kerby Nora [phone].  He wasn't able to confirm receipt of the faxed paperwork. He asked that our staff calls him back in another hour regarding this patients admission.

## 2012-07-03 NOTE — Progress Notes (Signed)
Pt confirms pcp is [facility] [place]  EPIC updated

## 2012-07-04 LAB — POCT I-STAT, CHEM 8
BUN: 14 mg/dL (ref 6–23)
Calcium, Ion: 1.21 mmol/L (ref 1.13–1.30)
Chloride: 98 mEq/L (ref 96–112)
Creatinine, Ser: 0.9 mg/dL (ref 0.50–1.35)
Glucose, Bld: 231 mg/dL — ABNORMAL HIGH (ref 70–99)
HCT: 42 % (ref 39.0–52.0)
Hemoglobin: 14.3 g/dL (ref 13.0–17.0)
Potassium: 4.2 mEq/L (ref 3.5–5.1)
Sodium: 132 mEq/L — ABNORMAL LOW (ref 135–145)
TCO2: 25 mmol/L (ref 0–100)

## 2012-07-04 LAB — RAPID URINE DRUG SCREEN, HOSP PERFORMED
Amphetamines: NOT DETECTED
Barbiturates: NOT DETECTED
Benzodiazepines: NOT DETECTED
Cocaine: NOT DETECTED
Opiates: NOT DETECTED
Tetrahydrocannabinol: NOT DETECTED

## 2012-07-04 LAB — GLUCOSE, CAPILLARY
Glucose-Capillary: 174 mg/dL — ABNORMAL HIGH (ref 70–99)
Glucose-Capillary: 181 mg/dL — ABNORMAL HIGH (ref 70–99)
Glucose-Capillary: 151 mg/dL — ABNORMAL HIGH (ref 70–99)

## 2012-07-04 MED ORDER — METFORMIN HCL 500 MG PO TABS
500.0000 mg | ORAL_TABLET | Freq: Every day | ORAL | Status: DC
  Administered 2012-07-04 – 2012-07-06 (×3): 500 mg via ORAL
  Filled 2012-07-04 (×4): qty 1

## 2012-07-04 NOTE — BHH Counselor (Signed)
07/04/12  [facility] has staffing issues and unable to consider patient for an inpatient admission.   07/04/12  Referral sent to [facility]; Pending confirmation. Kerby Nora at [facility], states that referral will not be reviewed until Monday.  [facility] [place] has no available beds until possibly Monday.  Kerby Nora unable to confirm if referral packet was received, he checked log and did not see any referral from [facility], however states that packet may be in another office.  Packed resubmitted by ACT.   07/04/12  Pt referred to [facility], [facility], [place], [place], [place], [place], and [place].  07/04/12 No beds at [facility], [facility], [facility].

## 2012-07-04 NOTE — ED Notes (Signed)
Patient is combative.  Was not cooperating when given a shower.

## 2012-07-04 NOTE — ED Notes (Signed)
Pt is alert and cooperative, verified the presence of his wallet, Bible, and CD player locked in room

## 2012-07-04 NOTE — ED Notes (Signed)
Pt is resting quietly at this time,  He is lying on right side with eyes closed

## 2012-07-04 NOTE — ED Notes (Signed)
Spoke w/ Dr. Rosalia Hammers regarding pt having DM and oral meds for same not ordered, will write orders.

## 2012-07-04 NOTE — ED Provider Notes (Signed)
Patient states he is nonambulatory and continues to be suicidal.  Patient is to have further assessment of psychiatric condition and [facility] He states he only drank one beer the day he was transported here and denies any s/s of withdrawal.   Hilario Quarry, MD 07/04/12 1511

## 2012-07-04 NOTE — ED Notes (Signed)
Pt is returning from shower and he is complaining about [place] cleaning his room, " he states he is not kidding and that he will sue [place]"  Pt assisted back to bed

## 2012-07-04 NOTE — ED Notes (Signed)
Pt was allowed to sleep during the night at his request and he was to bathe this am,  When we woke pt up to take a bath he once again starting screaming at staff that he was suicidal and homicidal and then he swung at me,  Pt was attempted to be redirected by staffing as he continued to state he would fall and we advised him of a shower chair and staff would be at his side, when attempting to assist him to chair he said as once again swinging at staff "get your damn hands off me I will see you in court",  Security at bedside along with staff,  Pt again advised of needed bath due to him having on a brief as per his request and after him sleeping all night he add urine filled brief,  Pt continues to swing at staff and curse as he is transported by wheelchair to shower.  There were numerous attempts to explain the need of a shower so his skin wouldn't break down,

## 2012-07-04 NOTE — ED Notes (Signed)
Pt yelling and screaming at phlebotomist who is at bedside to draw new lab ordered. Explained to pt why this has been ordered. Pt then agrees to allow lab draw, once skin has been punctured pt again states yelling and screaming, "take it out, that is a garden hose, you don't know what you are doing, you need to stop that right now". Puncture was stopped and phlebotomist draw after another stick.

## 2012-07-04 NOTE — ED Notes (Signed)
Pt resting in bed. Pt concerned about belongings not being in the room with him. It was explained

## 2012-07-04 NOTE — ED Notes (Signed)
Pt in shower at this time assisted by two staff members,  Alinda Money RN and Daphine Deutscher NT, also security at side

## 2012-07-04 NOTE — ED Notes (Signed)
While pt was away showering his  Room was cleaned and a hospital bed with clean linens that was more comfortable placed in his room.

## 2012-07-04 NOTE — BHH Counselor (Addendum)
Kristen at [facility] - pt declined due to chronicity. Pt has been admitted to [facility] multiple times. Linda at [place] - pt declined b/c they don't take substance abusers. Bonita Quin - [place] at [facility] - they never received fax from [place]. They aren't taking any patients over the weekend but we can fax in another referral Mon am. Christa at [facility]. [facility] - high acuity on unit so not taking any patient tonight. Pt hasn't been reviewed yet but will be. Amil Amen at [place] - pt has not been reviewed yet as they have many psych pts in ED to be assessed first. [facility] - admissions staff is only there during the day.  Per 3/22 AM ACT shift report, pt was declined at [facility] due to aggression.  Evette Cristal, [place] Assessment Counselor

## 2012-07-05 LAB — GLUCOSE, CAPILLARY
Glucose-Capillary: 191 mg/dL — ABNORMAL HIGH (ref 70–99)
Glucose-Capillary: 113 mg/dL — ABNORMAL HIGH (ref 70–99)
Glucose-Capillary: 128 mg/dL — ABNORMAL HIGH (ref 70–99)

## 2012-07-05 NOTE — ED Notes (Signed)
[id number]<AV> Expected date:<BR> Expected time:<BR> Means of arrival:<BR> Comments:<BR> tcu

## 2012-07-05 NOTE — Progress Notes (Signed)
Patient belongings (backpack (black), 3 other bags plus a large trash bag of clothing and a cane) were placed in locked cabinet in room 25.

## 2012-07-05 NOTE — ED Provider Notes (Signed)
PT seen and evaluated this am.  Denies complaints.  Still awaiting placement.  Rolan Bucco, MD 07/05/12 1159

## 2012-07-05 NOTE — ED Notes (Signed)
Bedside report received from previous RN, Shelley 

## 2012-07-05 NOTE — BHH Counselor (Signed)
Cedric at [facility] - pt declined b/c they don't accept dual diagnosis, despite fact that pt had a one day relapse only.  Evette Cristal, [place] Assessment Counselor

## 2012-07-05 NOTE — BHH Counselor (Addendum)
Per previous shift report, telepsych was ordered and recommended inpatient. Writer unable to locate telepsych consult in either pt's paper chart or scanned in EPIC. Spoke w/ Terri at [facility] and she said no telepsych ordered. Last telepsych consult she found for pt was dated 2013.  Evette Cristal, [place] Assessment Counselor

## 2012-07-06 ENCOUNTER — Emergency Department (HOSPITAL_COMMUNITY)
Admission: EM | Admit: 2012-07-06 | Discharge: 2012-07-07 | Disposition: A | Discharge status: Home / Self Care | Payer: Medicare Other | Attending: Emergency Medicine | Admitting: Emergency Medicine

## 2012-07-06 ENCOUNTER — Encounter (HOSPITAL_COMMUNITY): Payer: Self-pay | Admitting: *Deleted

## 2012-07-06 DIAGNOSIS — F4310 Post-traumatic stress disorder, unspecified: Secondary | ICD-10-CM

## 2012-07-06 DIAGNOSIS — F10929 Alcohol use, unspecified with intoxication, unspecified: Secondary | ICD-10-CM

## 2012-07-06 DIAGNOSIS — Z8659 Personal history of other mental and behavioral disorders: Secondary | ICD-10-CM | POA: Insufficient documentation

## 2012-07-06 DIAGNOSIS — I10 Essential (primary) hypertension: Secondary | ICD-10-CM | POA: Insufficient documentation

## 2012-07-06 DIAGNOSIS — Z79899 Other long term (current) drug therapy: Secondary | ICD-10-CM | POA: Insufficient documentation

## 2012-07-06 DIAGNOSIS — Z7982 Long term (current) use of aspirin: Secondary | ICD-10-CM | POA: Insufficient documentation

## 2012-07-06 DIAGNOSIS — F1010 Alcohol abuse, uncomplicated: Secondary | ICD-10-CM | POA: Insufficient documentation

## 2012-07-06 DIAGNOSIS — Z8669 Personal history of other diseases of the nervous system and sense organs: Secondary | ICD-10-CM | POA: Insufficient documentation

## 2012-07-06 DIAGNOSIS — E119 Type 2 diabetes mellitus without complications: Secondary | ICD-10-CM | POA: Insufficient documentation

## 2012-07-06 DIAGNOSIS — F17200 Nicotine dependence, unspecified, uncomplicated: Secondary | ICD-10-CM | POA: Insufficient documentation

## 2012-07-06 LAB — GLUCOSE, CAPILLARY
Glucose-Capillary: 166 mg/dL — ABNORMAL HIGH (ref 70–99)
Glucose-Capillary: 132 mg/dL — ABNORMAL HIGH (ref 70–99)

## 2012-07-06 NOTE — BHH Counselor (Signed)
Received a call back from April Alexandra, RN Pharmacist, community) with [facility]. Per April no paperwork was received regarding this patient. Writer offered to re-send referral paperwork so that patient is placed on their wait list. April sts, "Don't send anything until a bed becomes available". She explained that their is a long wait list and if paperwork is sent it will be held for 24 hours and then thrown away.  She sts that patient will be placed in  "[facility]" and when a bed becomes available a staff member from the [place] will let our staff know.  ACT/SW will be able to proceed with the referral process once notified of the [facility] bed [facility].

## 2012-07-06 NOTE — Progress Notes (Addendum)
CSW met with pt at bedside. Pt alert and oriented x4. Pt requesting to return to [facility] to retrieve his wheelchair. Pt refused assistance with alf placement. Pt aware he may not [facility] due to his intoxication and behaviors prior to coming to the ED. Patient states he will make further arrangments at another place. Pt refused for csw to call pt sister. Patient provided with cab [facility]cher to [facility]. CSW provided pt with outpatient resources. Pt states he plans to go to AA.No further Clinical Social Work needs, signing off.     Catha Gosselin, LCSWA  [phone] .07/06/2012 1822pm

## 2012-07-06 NOTE — Consult Note (Signed)
Reason for Consult: Alcohol intoxication and substance induced mood disorder, homelessness. Referring Physician: Dr. Lonie Peak GORJE IYER is an 72 y.o. male.  HPI: Patient came to the [facility] long emergency department, VIA EMS from [facility]. Patient has been intoxicated with the alcohol and made suicide threat. Reportedly patient was dropped off from assisted living facility because he did not like and did not get along with other people after staying 3 months. Patient was a [place] veteran has been suffering with the alcohol dependence and posttraumatic stress disorder. Patient was previously admitted to multiple hospitalizations. Patient denies current symptoms of depression, anxiety, psychosis, and depressed to be released to the outside the hospital in a safe environment to leave and the primary care provided to provide the medical help. Patient has a orders as to lives with her husband, but cannot provide any help to him. Patient wife passed away in Sep 05, 2001 because of emphysema. Patient reported he does not have children of his own.  MSE: Patient appeared sitting in chair with holding a cane and the has been calm, cooperative. Patient stated mood is "feel like I am increasing and people moving around. room to room. Patient denies suicidal or homicidal ideations, intentions or plans. Patient has no evidence of psychotic symptoms, auditory or visual hallucinations, delusions and paranoia.  Past Medical History  Diagnosis Date  . Diabetes mellitus   . Hypertension   . Neuropathy   . Mental disorder   . Depression   . Anxiety     History reviewed. No pertinent past surgical history.  History reviewed. No pertinent family history.  Social History:  reports that he has been smoking Cigarettes.  He has been smoking about 2.00 packs per day. He has never used smokeless tobacco. He reports that he drinks about 2.4 ounces of alcohol per week. He reports that he does not use illicit  drugs.  Allergies: No Known Allergies  Medications: I have reviewed the patient's current medications.  Results for orders placed during the hospital encounter of 07/02/12 (from the past 48 hour(s))  GLUCOSE, CAPILLARY     Status: Abnormal   Collection Time    07/04/12 10:10 PM      Result Value Range   Glucose-Capillary 151 (*) 70 - 99 mg/dL   Comment 1 Notify RN    GLUCOSE, CAPILLARY     Status: Abnormal   Collection Time    07/05/12  8:28 AM      Result Value Range   Glucose-Capillary 191 (*) 70 - 99 mg/dL   Comment 1 Documented in Chart     Comment 2 Notify RN    GLUCOSE, CAPILLARY     Status: Abnormal   Collection Time    07/05/12  1:09 PM      Result Value Range   Glucose-Capillary 113 (*) 70 - 99 mg/dL  GLUCOSE, CAPILLARY     Status: Abnormal   Collection Time    07/05/12  6:21 PM      Result Value Range   Glucose-Capillary 128 (*) 70 - 99 mg/dL  GLUCOSE, CAPILLARY     Status: Abnormal   Collection Time    07/06/12 10:01 AM      Result Value Range   Glucose-Capillary 166 (*) 70 - 99 mg/dL  GLUCOSE, CAPILLARY     Status: Abnormal   Collection Time    07/06/12 12:02 PM      Result Value Range   Glucose-Capillary 132 (*) 70 - 99 mg/dL  No results found.  Positive for anxiety, bad mood, excessive alcohol consumption and [place] [name] and has a history of posttraumatic stress disorder Blood pressure 142/83, pulse 71, temperature 99 F (37.2 C), temperature source Oral, resp. rate 16, SpO2 94.00%.   Assessment/Plan: Posttraumatic stress disorder, chronic Alcohol abuse versus dependence Psychosocial issues.  Patient does not meet criteria for acute psychiatric hospitalization and will be referred to the outpatient psychiatric services. Patient will be referred to the hospital social service for appropriate disposition plans, place of living, Assisted living facility or homeless shelter and needed his motor wheel chair which was left at [employer].     Shamell Hittle,JANARDHAHA R. 07/06/2012, 4:44 PM

## 2012-07-06 NOTE — ED Notes (Signed)
Pt has been cleared by the psych MD Dr.J and is ready for discharge. Pt will seek ALF placement on his own. SW has spoken with pt and given options of facilities.

## 2012-07-06 NOTE — ED Provider Notes (Signed)
Patient has been cleared for discharge by the psychiatrist  Toy Baker, MD 07/06/12 1815

## 2012-07-06 NOTE — ED Notes (Signed)
Per EMS report: Pt c/o of SI and refused to allow EMS to take vitals.  EMS reports pt smells of ETOH.

## 2012-07-06 NOTE — ED Provider Notes (Signed)
Filed Vitals:   07/06/12 0559  BP: 142/83  Pulse: 71  Resp: 16   No complaints this am. [facility] with [facility].   Raeford Razor, MD 07/06/12 [phone]

## 2012-07-06 NOTE — Progress Notes (Signed)
Cm reviewed pt information/ disposition concerns with pm ED SW

## 2012-07-06 NOTE — ED Notes (Signed)
MD at bedside. 

## 2012-07-06 NOTE — Progress Notes (Signed)
Discussed in [facility] ED tx meeting: consulted ED SW for possible shelter and/or ALF/snf placement Pending possibilities

## 2012-07-06 NOTE — Progress Notes (Signed)
CM reviewed clinical encounter labs NA 129 (07/02/12) to 132 (07/04/12) Cm spoke with Dr Freida Busman EDP No re check at this time

## 2012-07-06 NOTE — BH Assessment (Addendum)
Assessment Note Initial Assessment - Ernest [name] is a 72 y.o. male WHO PRESENTS VOLUNTARILY WITH SI/HI. 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 REPORTEDLY CAME TO [name]IA [facility] FROM [facility] AND WAS INTOXICATED UPON ARRIVAL. PT TOLD MEDICAL STAFF THAT HE CONSUMED 2-40 OZ BEERS TODAY. DURING ASSESSMENT, PT REPEATEDLY VERBALIZED THAT HE WAS A [name], HONORABLY DISCHARGED AND HAS PTSD. PT ALSO SAYS HE WANTS TO GO BACK TO THE [name] IN [facility] [name]. PT WOULD NOT PROVIDE ANY OTHER INFO AND TOLD THIS WRITER TO GET OUT, HE HAD A HEADACHE AND "TURN THE GODDAMN LIGHTS OFF". PT TOLD THIS WRITER THAT I HAVE NO QUALIFICATIONS IN DIAGNOSING HIM OR "SOCIAL WORKING" HIM. PT WOULD NOT DISCLOSE ANY ISSUES WITH HALLUCINATIONS OR DELUSIONAL THOUGHTS, NO FLASHBACKS. PT IS PENDING [name] FOR FINAL DISPOSITION.  Reassessment 07/05/12. Pt continues to endorse SI. 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  He denies intent but then says that if he is discharged, he may have to "take matters into my own hands". He talks about his physical health and how he is becoming increasingly dependent on others. Pt irritable at times.  He denies HI. He denies AH and VH. No delusions noted. He says that he was a [name] and is educated and has done a lot of things in his life. He says that now he is pretty much bed bound and has to live in a place [name]) where "they are all crazy". Pt complains that he is being moved from [name] to another room and back again (based on staffing). He reports dx of PTSD and requests to be sent to [name]. Writer informs him that ACT has sent referral to [name] but no beds were available over the weekend. Pt then says that there are always beds there and "go ask [name]". Pt endorses depressed mood with irritability. He goes to [name] on outpatient basis. 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   Axis I: Mood Disorder NOS Axis II: Deferred Axis III:  Past Medical History  Diagnosis Date  . Diabetes mellitus   . Hypertension   . Neuropathy   . Mental disorder   . Depression   . Anxiety    Axis IV: other psychosocial or environmental problems, problems related to social environment and problems with primary support group Axis V: 31-40 impairment in reality testing  Past Medical History:  Past Medical History  Diagnosis Date  . Diabetes mellitus   . Hypertension   . Neuropathy   . Mental disorder   . Depression   . Anxiety     History reviewed. No pertinent past surgical history.  Family History: History reviewed. No pertinent family history.  Social History:  reports that he has been smoking Cigarettes.  He has been smoking about 2.00 packs per day. He has never used smokeless tobacco. He reports that he drinks about 2.4 ounces of alcohol per week. He reports that he does not use illicit drugs.  Additional Social History:  Alcohol / Drug Use Pain Medications: See MAR  Prescriptions: See MAR  Over the Counter: See MAR  History of alcohol / drug use?: Yes Longest period of sobriety (when/how long): Unk  Negative Consequences of Use: Personal relationships Withdrawal Symptoms: Agitation;Irritability Substance #1 Name of Substance 1: Alcohol 1 - Age of First Use: Unk 1 -  Amount (size/oz): 2-40's 1 - Frequency: Unk 1 - Duration: 1 Day 1 - Last Use / Amount: 07/02/12  CIWA: CIWA-Ar BP: 110/70 mmHg Pulse Rate: 70 Nausea and Vomiting: no nausea and no vomiting Tactile Disturbances: none Tremor: no tremor Auditory Disturbances: not present Paroxysmal Sweats: no sweat visible Visual Disturbances: not present Anxiety: no anxiety, at ease Headache, Fullness in Head: none present Agitation: normal activity Orientation and Clouding of Sensorium: oriented and can do serial additions CIWA-Ar Total: 0 COWS:    Allergies: No Known Allergies  Home  Medications:  (Not in a [name] admission)  OB/GYN Status:  No LMP for male patient.  General Assessment Data Location of Assessment: [name] Living Arrangements: Alone Can pt return to current living arrangement?: Yes Admission Status: Involuntary Is patient capable of signing voluntary admission?: Yes Transfer from: [name] Referral Source: Self/Family/Friend  Education Status Is patient currently in school?: No Current Grade: None  Highest grade of school patient has completed: None  Name of school: None  Contact person: None   Risk to self Suicidal Ideation: Yes-Currently Present Suicidal Intent:  (pt states if d/c he may take matters into his own hands) Is patient at risk for suicide?: Yes Suicidal Plan?: No Access to Means: Yes Specify Access to Suicidal Means: could get a gun What has been your use of drugs/alcohol within the last 12 months?: recovering alcoholic, one day relapse 07/02/12 Previous Attempts/Gestures: No How many times?: 0 Other Self Harm Risks: none' Triggers for Past Attempts: Other (Comment) (PTSD([name])) Intentional Self Injurious Behavior: None Family Suicide History: No Recent stressful life event(s): Recent negative physical changes Persecutory voices/beliefs?: No Depression: Yes Depression Symptoms: Feeling angry/irritable Substance abuse history and/or treatment for substance abuse?: Yes Suicide prevention information given to non-admitted patients: Not applicable  Risk to Others Homicidal Ideation: No Thoughts of Harm to Others: No Comment - Thoughts of Harm to Others:  ("I will if I have to"--no addt'l info disclosed ) Current Homicidal Intent: No Current Homicidal Plan: No Access to Homicidal Means: No Identified Victim: none History of harm to others?: No Assessment of Violence: None Noted Violent Behavior Description: pt attempted to swing at staff in [facility] [name] when staff trying to get him into wheelchair to shower Does  patient have access to weapons?: No Criminal Charges Pending?: No Does patient have a court date: No  Psychosis Hallucinations: None noted Delusions: None noted  Mental Status Report Appear/Hygiene: Other (Comment) (unremarkable) Eye Contact: Fair Motor Activity: Unremarkable (lying on side) Speech: Loud;Logical/coherent Level of Consciousness: Irritable;Alert Mood: Depressed;Irritable Affect: Irritable;Appropriate to circumstance Anxiety Level: None Thought Processes: Coherent;Relevant Judgement: Unimpaired Orientation: Person;Place;Time;Situation Obsessive Compulsive Thoughts/Behaviors: None  Cognitive Functioning Concentration: Normal Memory: Recent Intact;Remote Intact IQ: Average Insight: Fair Impulse Control: Fair Appetite: Good Weight Loss: 0 Weight Gain: 0 Sleep: No Change Total Hours of Sleep:  (Unk ) Vegetative Symptoms: None  ADLScreening [facility]) Patient's cognitive ability adequate to safely complete daily activities?: Yes Patient able to express need for assistance with ADLs?: Yes Independently performs ADLs?: No  Abuse/Neglect [name]) Physical Abuse: Denies Verbal Abuse: Denies Sexual Abuse: Denies  Prior Inpatient Therapy Prior Inpatient Therapy: Yes Prior Therapy Dates: 2005-2012 Prior Therapy Facilty/Provider(s): [facility] x 15 admits Reason for Treatment: depression, SI  Prior Outpatient Therapy Prior Outpatient Therapy: Yes Prior Therapy Dates: currently Prior Therapy Facilty/Provider(s): [name] [place]  Reason for Treatment: Med Mgt/ Therapy   ADL Screening (condition at time of admission) Patient's cognitive ability adequate to safely complete daily  activities?: Yes Patient able to express need for assistance with ADLs?: Yes Independently performs ADLs?: No Communication: Independent Dressing (OT): Independent Grooming: Independent Feeding: Independent Bathing: Needs assistance Is this a change from baseline?:  Pre-admission baseline Toileting: Needs assistance Is this a change from baseline?: Pre-admission baseline In/Out Bed: Needs assistance Is this a change from baseline?: Pre-admission baseline Walks in Home: Needs assistance Is this a change from baseline?: Pre-admission baseline Weakness of Legs: None Weakness of Arms/Hands: None  Home Assistive Devices/Equipment Home Assistive Devices/Equipment: Wheelchair  Therapy Consults (therapy consults require a physician order) PT Evaluation Needed: No OT Evalulation Needed: No SLP Evaluation Needed: No Abuse/Neglect Assessment (Assessment to be complete while patient is alone) Physical Abuse: Denies Verbal Abuse: Denies Sexual Abuse: Denies Exploitation of patient/patient's resources: Denies Self-Neglect: Denies Values / Beliefs Cultural Requests During Hospitalization: None Spiritual Requests During Hospitalization: None Consults Spiritual Care Consult Needed: No Social Work Consult Needed: No Merchant navy officer (For Healthcare) Advance Directive: Patient does not have advance directive;Patient would not like information Pre-existing out of facility DNR order (yellow form or pink MOST form): No Nutrition Screen- [facility] Patient's home diet: Regular Have you recently lost weight without trying?: No Have you been eating poorly because of a decreased appetite?: No Malnutrition Screening Tool Score: 0  Additional Information 1:1 In Past 12 Months?: No CIRT Risk: No Elopement Risk: No Does patient have medical clearance?: Yes     Disposition:  Disposition Initial Assessment Completed for this Encounter: Yes Disposition of Patient: Inpatient treatment program;Outpatient treatment Type of inpatient treatment program: Adult Type of outpatient treatment: Adult Patient referred to:  (Pending Telepsych )  On Site Evaluation by:   Reviewed with Physician:     Donnamarie Rossetti P 07/06/2012 3:37 AM

## 2012-07-06 NOTE — BHH Counselor (Signed)
Followed up with patients placement at the [facility] [phone] extension [phone]. Left a voice message for the transfer coordinator regarding patients referral. No confirmation that patients support paperwork was received at this time. Writer will continue following up with staff at the [facility] regarding patients referral.

## 2012-07-06 NOTE — ED Notes (Signed)
Pt c/o of hunger.

## 2012-07-06 NOTE — BHH Counselor (Signed)
Contacted [facility] 07/04/2012 and told that their was staffing issues therefore patient was not ran. Writer re-contacted [facility] again today to see if patient was ran by a MD/PA/NP since 07/04/2012 for a potential admission. Per Elijah Birk, patient has not been ran and will be placed on the list to be ran today.

## 2012-07-06 NOTE — BH Assessment (Signed)
BHH Assessment Progress Note      Due to the acuity on the unit he would be better suited to [facility] or [facility]. He needs a 400 hall bed due to his high demand and disruptive behaviors and that unit is very acute currently.

## 2012-07-07 NOTE — Progress Notes (Signed)
Clinical Social Work  CSW received call from ED reporting that patient was at the ED due to [facility] but was cleared by psych MD and dc. Patient waiting in lobby to speak with CSW regarding placement concerns.  CSW met with patient in consult room in ED. Patient reports he is homeless and needs somewhere to stay. Patient refuses to let CSW call sister and reports he has no other family. Later in assessment, patient does report that he has a son and a dtr but has not spoken to them in over 40 years. Patient reports that it is not an option to live with family and friends but cannot survive on the streets. Patient agreeable to shelter placement. CSW spoke with [facility] who will not accept patient to shelter. [facility] has beds available but require photo ID which patient does not have. CSW spoke with [facility] shelter who reports that they have available beds and do not require a photo ID. CSW received approval from Interior and spatial designer [facility] Rife) to provide taxi voucher to [place]. CSW informed patient of shelter and patient was hesitant at first but agreeable to plans.  CSW coordinated transportation via [place] [employer]. CSW provided patient with taxi voucher to give to driver. CSW informed ED of plan. CSW is signing off.  Unk [place], [place]

## 2012-07-07 NOTE — ED Notes (Signed)
CBG was 191 

## 2012-07-07 NOTE — ED Provider Notes (Signed)
History     CSN: [id number]  Arrival date & time 07/06/12  2321   First MD Initiated Contact with Patient 07/07/12 0036      No chief complaint on file.   (Consider location/radiation/quality/duration/timing/severity/associated sxs/prior treatment) HPI History provided by patient and the EMS. Drinking alcohol tonight, a few hours after being discharged from the emergency department after prolonged ED stay. Patient had been offered multiple social services and declined all of them. He is alcoholic and declines any family involvement with his issues. He was medically and psychiatrically cleared with planned followup at the [facility] inpatient at that time and agreed to go to AA on his own accord.  He was given a cab voucher.   911 was called tonight and patient was not cooperative with EMS - report stated suicidal ideation, patient denies any of this to me. He does have history of depression and mental disorder.  No hallucinations. No homicidal ideation. No self injury. He is requesting to have his blood sugar checked and is agreeable to have his vital signs checked. He has no medical complaints.  Past Medical History  Diagnosis Date  . Diabetes mellitus   . Hypertension   . Neuropathy   . Mental disorder   . Depression   . Anxiety     History reviewed. No pertinent past surgical history.  No family history on file.  History  Substance Use Topics  . Smoking status: Current Every Day Smoker -- 2.00 packs/day    Types: Cigarettes  . Smokeless tobacco: Never Used  . Alcohol Use: 2.4 oz/week    4 Cans of beer per week     Comment: Pt reported that he dranked 4 beers      Review of Systems  Constitutional: Negative for fever and chills.  HENT: Negative for neck pain and neck stiffness.   Eyes: Negative for pain.  Respiratory: Negative for shortness of breath.   Cardiovascular: Negative for chest pain.  Gastrointestinal: Negative for abdominal pain.   Genitourinary: Negative for dysuria.  Musculoskeletal: Negative for back pain.  Skin: Negative for rash.  Neurological: Negative for headaches.  All other systems reviewed and are negative.    Allergies  Review of patient's allergies indicates no known allergies.  Home Medications   Current Outpatient Rx  Name  Route  Sig  Dispense  Refill  . aspirin 81 MG tablet   Oral   Take 81 mg by mouth daily.         . fish oil-omega-3 fatty acids 1000 MG capsule   Oral   Take 2 g by mouth daily.         Marland Kitchen glipiZIDE (GLUCOTROL) 10 MG tablet   Oral   Take 10 mg by mouth 2 (two) times daily before a meal.         . Menthol, Topical Analgesic, (BIOFREEZE ROLL-ON EX)   Apply externally   Apply topically.         . metFORMIN (GLUCOPHAGE) 500 MG tablet   Oral   Take 500 mg by mouth 2 (two) times daily with a meal.         . oxyCODONE (OXYCONTIN) 10 MG 12 hr tablet   Oral   Take 10 mg by mouth as needed. For pain relief           BP 107/61  Pulse 61  Temp(Src) 97.5 F (36.4 C) (Oral)  Resp 16  SpO2 93%  Physical Exam  Constitutional: He is  oriented to person, place, and time. He appears well-developed and well-nourished.  HENT:  Head: Normocephalic and atraumatic.  No evidence of injury or trauma  Eyes: EOM are normal. Pupils are equal, round, and reactive to light.  Neck: Neck supple.  No cervical spine tenderness or deformity  Cardiovascular: Regular rhythm and intact distal pulses.   Pulmonary/Chest: Effort normal. No respiratory distress.  Abdominal: Soft. Bowel sounds are normal. He exhibits no distension. There is no tenderness. There is no rebound and no guarding.  Musculoskeletal: Normal range of motion. He exhibits no edema.  Neurological: He is alert and oriented to person, place, and time.  Able to maneuver his wheelchair without distress. Speech is clear. No focal deficits.  Skin: Skin is warm and dry.    ED Course  Procedures (including  critical care time)  Results for orders placed during the hospital encounter of 07/06/12  GLUCOSE, CAPILLARY      Result Value Range   Glucose-Capillary 191 (*) 70 - 99 mg/dL   Old records reviewed and patient has outpatient facility referrals for assisted-living facilities, alcoholics anonymous, and is followed by the [facility] in [place]. No indication for admit at this time. Patient requesting to be discharged, refuses to have his family be called or involved.   MDM  Alcohol intoxication and alcoholic, just released from the emergency apartment after psychiatric evaluation. He has refused multiple social services, assisted-living, and was discharged on his own accord to followup with the [place] and AA  Patient to sobered in emergency department. No deficits. No indication for admission.  Vital signs, old records and nursing notes reviewed and considered.        Sunnie Nielsen, MD 07/07/12 [id number]

## 2012-07-31 ENCOUNTER — Encounter (HOSPITAL_COMMUNITY): Payer: Self-pay | Admitting: *Deleted

## 2012-07-31 ENCOUNTER — Emergency Department (HOSPITAL_COMMUNITY)
Admission: EM | Admit: 2012-07-31 | Discharge: 2012-08-04 | Disposition: A | Discharge status: Home / Self Care | Payer: Medicare Other | Attending: Emergency Medicine | Admitting: Emergency Medicine

## 2012-07-31 DIAGNOSIS — Z8659 Personal history of other mental and behavioral disorders: Secondary | ICD-10-CM | POA: Insufficient documentation

## 2012-07-31 DIAGNOSIS — F17200 Nicotine dependence, unspecified, uncomplicated: Secondary | ICD-10-CM | POA: Insufficient documentation

## 2012-07-31 DIAGNOSIS — Z7982 Long term (current) use of aspirin: Secondary | ICD-10-CM | POA: Insufficient documentation

## 2012-07-31 DIAGNOSIS — I10 Essential (primary) hypertension: Secondary | ICD-10-CM | POA: Insufficient documentation

## 2012-07-31 DIAGNOSIS — F10229 Alcohol dependence with intoxication, unspecified: Secondary | ICD-10-CM | POA: Insufficient documentation

## 2012-07-31 DIAGNOSIS — E119 Type 2 diabetes mellitus without complications: Secondary | ICD-10-CM | POA: Insufficient documentation

## 2012-07-31 DIAGNOSIS — Z8669 Personal history of other diseases of the nervous system and sense organs: Secondary | ICD-10-CM | POA: Insufficient documentation

## 2012-07-31 DIAGNOSIS — F1020 Alcohol dependence, uncomplicated: Secondary | ICD-10-CM

## 2012-07-31 DIAGNOSIS — Z79899 Other long term (current) drug therapy: Secondary | ICD-10-CM | POA: Insufficient documentation

## 2012-07-31 DIAGNOSIS — Z5900 Homelessness unspecified: Secondary | ICD-10-CM | POA: Insufficient documentation

## 2012-07-31 LAB — CBC WITH DIFFERENTIAL/PLATELET
Eosinophils Relative: 1 % (ref 0–5)
HCT: 42.6 % (ref 39.0–52.0)
Lymphocytes Relative: 20 % (ref 12–46)
Lymphs Abs: 2.5 10*3/uL (ref 0.7–4.0)
MCV: 79 fL (ref 78.0–100.0)
Monocytes Absolute: 0.5 10*3/uL (ref 0.1–1.0)
RBC: 5.39 MIL/uL (ref 4.22–5.81)
WBC: 12.4 10*3/uL — ABNORMAL HIGH (ref 4.0–10.5)

## 2012-07-31 LAB — BASIC METABOLIC PANEL
BUN: 9 mg/dL (ref 6–23)
CO2: 22 mEq/L (ref 19–32)
Calcium: 9.7 mg/dL (ref 8.4–10.5)
Creatinine, Ser: 0.63 mg/dL (ref 0.50–1.35)
Glucose, Bld: 129 mg/dL — ABNORMAL HIGH (ref 70–99)

## 2012-07-31 LAB — GLUCOSE, CAPILLARY
Glucose-Capillary: 101 mg/dL — ABNORMAL HIGH (ref 70–99)
Glucose-Capillary: 89 mg/dL (ref 70–99)

## 2012-07-31 LAB — RAPID URINE DRUG SCREEN, HOSP PERFORMED
Barbiturates: NOT DETECTED
Tetrahydrocannabinol: NOT DETECTED

## 2012-07-31 MED ORDER — ASPIRIN 81 MG PO CHEW
81.0000 mg | CHEWABLE_TABLET | Freq: Every day | ORAL | Status: DC
  Administered 2012-07-31 – 2012-08-04 (×5): 81 mg via ORAL
  Filled 2012-07-31 (×5): qty 1

## 2012-07-31 MED ORDER — ALUM & MAG HYDROXIDE-SIMETH 200-200-20 MG/5ML PO SUSP: 30.0000 mL | ORAL | Status: DC | PRN

## 2012-07-31 MED ORDER — METFORMIN HCL 500 MG PO TABS
500.0000 mg | ORAL_TABLET | Freq: Two times a day (BID) | ORAL | Status: DC
  Administered 2012-07-31 – 2012-08-04 (×8): 500 mg via ORAL
  Filled 2012-07-31 (×8): qty 1

## 2012-07-31 MED ORDER — LORAZEPAM 1 MG PO TABS
1.0000 mg | ORAL_TABLET | Freq: Three times a day (TID) | ORAL | Status: DC | PRN
  Administered 2012-08-01 (×2): 1 mg via ORAL
  Filled 2012-07-31 (×2): qty 1

## 2012-07-31 MED ORDER — NICOTINE 21 MG/24HR TD PT24
21.0000 mg | MEDICATED_PATCH | Freq: Every day | TRANSDERMAL | Status: DC
  Administered 2012-08-01 – 2012-08-03 (×3): 21 mg via TRANSDERMAL
  Filled 2012-07-31 (×3): qty 1

## 2012-07-31 MED ORDER — OXYCODONE HCL ER 10 MG PO T12A
10.0000 mg | EXTENDED_RELEASE_TABLET | Freq: Two times a day (BID) | ORAL | Status: DC
  Administered 2012-07-31 – 2012-08-04 (×8): 10 mg via ORAL
  Filled 2012-07-31 (×8): qty 1

## 2012-07-31 MED ORDER — SODIUM CHLORIDE 0.9 % IV BOLUS (SEPSIS)
1000.0000 mL | Freq: Once | INTRAVENOUS | Status: AC
  Administered 2012-07-31: 1000 mL via INTRAVENOUS

## 2012-07-31 MED ORDER — GLIPIZIDE 10 MG PO TABS
10.0000 mg | ORAL_TABLET | Freq: Two times a day (BID) | ORAL | Status: DC
  Administered 2012-07-31 – 2012-08-04 (×8): 10 mg via ORAL
  Filled 2012-07-31 (×10): qty 1

## 2012-07-31 MED ORDER — ONDANSETRON HCL 8 MG PO TABS: 4.0000 mg | ORAL_TABLET | Freq: Three times a day (TID) | ORAL | Status: DC | PRN

## 2012-07-31 MED ORDER — IBUPROFEN 400 MG PO TABS
600.0000 mg | ORAL_TABLET | Freq: Three times a day (TID) | ORAL | Status: DC | PRN
  Administered 2012-08-01 (×2): 600 mg via ORAL
  Filled 2012-07-31 (×3): qty 1

## 2012-07-31 NOTE — BHH Counselor (Signed)
Faxed transfer packet to [place] to [phone]. Then follow up call to Kerby Nora at [phone] who confirmed receipt of fax and told writer that there still aren't any beds available yet. Patient is on wait list. Oncoming ACT will followup w/ [facility] on 08/01/12 to check on bed availability.  Evette Cristal, [place] Assessment Counselor

## 2012-07-31 NOTE — ED Notes (Signed)
Report received vital signs stable

## 2012-07-31 NOTE — ED Notes (Signed)
Patient stated he felt like his sugar dropped and he needed some milk.

## 2012-07-31 NOTE — ED Notes (Signed)
Upon entering the room patient asked this nurse if she was the psychiatrist, and told no.  Asked patient why he is here and he told me that "they tell me I am homicidal".  Asked who are they and he said the ones that took my weapon but I can always get another one.  Asked for some cream for his arms stating they are hurting.  States does not walk because he has neuropathy.  Also stated that he was just discharged from [facility]

## 2012-07-31 NOTE — Progress Notes (Signed)
CSW spoke with April Alexander [phone]  ext: [phone]) with transfers at [facility] for [facility] admission. [name] was recently inpt and continues to endorse SI at this time with recommendation for inpt Tx established via telepsych assessment.  Ms. Lyn Hollingshead placed veteran on the wait list for inpt hospitalization and explained that ACT team can check back over the weekend for any possible d/c's and transfer.   Leron Croak, LCSWA [facility] Emergency Dept.  [phone]

## 2012-07-31 NOTE — ED Notes (Signed)
Patient unable to urinate at this time. 

## 2012-07-31 NOTE — ED Notes (Signed)
Patient incont of urine linens changed

## 2012-07-31 NOTE — BHH Counselor (Signed)
[place] Magistrate Maisie Fus confirmed receipt of IVC paperwork. Originals placed in IVC manilla folder in ACT office and copies placed in pt's chart.  Evette Cristal, [place] Assessment Counselor
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
## 2012-07-31 NOTE — ED Provider Notes (Signed)
History     CSN: [id number]  Arrival date & time 07/31/12  0041   First MD Initiated Contact with Patient 07/31/12 0135      Chief Complaint  Patient presents with  . Pain    (Consider location/radiation/quality/duration/timing/severity/associated sxs/prior treatment) HPI Comments: PT comes in with cc of intoxication. LEVEL 5 CAVEAT FOR INTOXICATION.  Pt has diabetes hx, and has hx of alcoholism. Recent ED visit resulted in d/c to shelter. PT is intoxicated. Pt denies nausea, emesis, fevers, chills, chest pains, shortness of breath, headaches, abdominal pain, uti like symptoms. He is not suicidal or homicidal. Complains of bicep pain due to neuropathy.  The history is provided by the patient.    Past Medical History  Diagnosis Date  . Diabetes mellitus   . Hypertension   . Neuropathy   . Mental disorder   . Depression   . Anxiety     History reviewed. No pertinent past surgical history.  History reviewed. No pertinent family history.  History  Substance Use Topics  . Smoking status: Current Every Day Smoker -- 2.00 packs/day    Types: Cigarettes  . Smokeless tobacco: Never Used  . Alcohol Use: 2.4 oz/week    4 Cans of beer per week     Comment: Pt reported that he dranked 3 beers      Review of Systems  Unable to perform ROS: Other    Allergies  Review of patient's allergies indicates no known allergies.  Home Medications   Current Outpatient Rx  Name  Route  Sig  Dispense  Refill  . glipiZIDE (GLUCOTROL) 10 MG tablet   Oral   Take 10 mg by mouth 2 (two) times daily before a meal.         . metFORMIN (GLUCOPHAGE) 500 MG tablet   Oral   Take 500 mg by mouth 2 (two) times daily with a meal.         . oxyCODONE (OXYCONTIN) 10 MG 12 hr tablet   Oral   Take 10 mg by mouth as needed. For pain relief         . aspirin 81 MG tablet   Oral   Take 81 mg by mouth daily.         . fish oil-omega-3 fatty acids 1000 MG capsule   Oral   Take 2  g by mouth daily.         . Menthol, Topical Analgesic, (BIOFREEZE ROLL-ON EX)   Apply externally   Apply topically.           BP 124/64  Pulse 75  Temp(Src) 97.8 F (36.6 C) (Oral)  Resp 18  SpO2 97%  Physical Exam  Constitutional: He is oriented to person, place, and time. He appears well-developed.  HENT:  Head: Normocephalic and atraumatic.  Eyes: Conjunctivae and EOM are normal. Pupils are equal, round, and reactive to light.  Neck: Normal range of motion. Neck supple.  Cardiovascular: Normal rate and regular rhythm.   Pulmonary/Chest: Effort normal and breath sounds normal.  Abdominal: Soft. Bowel sounds are normal. He exhibits no distension. There is no tenderness. There is no rebound and no guarding.  Neurological: He is alert and oriented to person, place, and time.  Skin: Skin is warm.    ED Course  Procedures (including critical care time)  Labs Reviewed  CBC WITH DIFFERENTIAL - Abnormal; Notable for the following:    WBC 12.4 (*)    RDW 16.8 (*)  Neutro Abs 9.2 (*)    All other components within normal limits  BASIC METABOLIC PANEL - Abnormal; Notable for the following:    Sodium 132 (*)    Glucose, Bld 129 (*)    All other components within normal limits  ETHANOL - Abnormal; Notable for the following:    Alcohol, Ethyl (B) 166 (*)    All other components within normal limits  GLUCOSE, CAPILLARY - Abnormal; Notable for the following:    Glucose-Capillary 101 (*)    All other components within normal limits  URINE RAPID DRUG SCREEN (HOSP PERFORMED)   No results found.   No diagnosis found.    MDM  Intoxicated patient brought to the ER for further evaluation.  Pt was picked up by EMS as was wandering streets. No SI/HI at arrival - but claimed to Child psychotherapist - who was consulted for placement, that he was suicidal, sp telepsych request placed.  If telepsych clears the patient, i feel comfortable sending the patient home. He was just  discharged from the Psych ward at [facility] 2 days ago.  Derwood Kaplan, MD 07/31/12 [phone]

## 2012-07-31 NOTE — ED Notes (Signed)
Requesting to be started on home meds, requesting "Aspircreme" for arm pain and oxycodone for continued pain.Marland Kitchen

## 2012-07-31 NOTE — ED Notes (Signed)
Patient arrived via EMS from outside the homeless shelter.  Patient was found in the middle of the road and the police moved him.  C/o pain all over.   +ETOH, incont.

## 2012-07-31 NOTE — Progress Notes (Signed)
Clinical Social Work Department BRIEF PSYCHOSOCIAL ASSESSMENT 07/31/2012  Patient:  Ernest [name], Ernest [name]     Account Number:  [id number]     Admit date:  07/31/2012  Clinical Social Worker:  Leron Croak, CLINICAL SOCIAL WORKER  Date/Time:  07/31/2012 11:07 AM  Referred by:  Physician  Date Referred:  07/31/2012 Referred for  Homelessness   Other Referral:   Interview type:  Patient Other interview type:    PSYCHOSOCIAL DATA Living Status:  OTHER Admitted from facility:   Level of care:   Primary support name:  Ernest [name] Primary support relationship to patient:  SIBLING Degree of support available:   Pt has very limited resources and support at this time.    CURRENT CONCERNS Current Concerns  Post-Acute Placement   Other Concerns:   Nursing services to monitor Pt's medical conditions and lack of ability to complete ADL's.  Contact Admissions Coordinator for [facility] SN[facility] for [name]'s is:  Ernest [name]   [phone]  CSW is Ernest [name]    SOCIAL WORK ASSESSMENT / PLAN CSW met with the Pt at the bedside. Pt stated that he was recently released from the [facility] yesterday and that he should not have left and that he still endorses SI and that he also knows that he can not "make it". Pt's medication was stollen at the homeless shelter and Pt was in the road and police contacted EMS.    Pt did state that he had been drinking, however stated it was because he was feeling "hopeless and SI". [name] stated that he can not make it on the street and "might as well give up". CSW questioned [name]'s recent admission on the inpt Psych unit at [facility] and Pt stated that he should not have left the [place] because he "was not ready". CSW explained the importance of remaining in [place] and then being placed in a SNF for [name]'s through the [place]. CSW notified that a referral was made and that he needed to also follow through with the referral so that he would have care and not become  homeless again. [name] agreed.   Assessment/plan status:  Information/Referral to [facility] Other assessment/ plan:   Information/referral to community resources:   CSW explained placement options and provided Pt with information about the facility.    PATIENT'S/FAMILY'S RESPONSE TO PLAN OF CARE: Pt appreciative for assistance and feeling somewhat hopeful for Inpt Psych Tx and SNF placement.        Leron Croak, LCSWA [facility] Emergency Dept.  [phone]

## 2012-08-01 LAB — GLUCOSE, CAPILLARY: Glucose-Capillary: 193 mg/dL — ABNORMAL HIGH (ref 70–99)

## 2012-08-01 MED ORDER — TROLAMINE SALICYLATE 10 % EX CREA: TOPICAL_CREAM | CUTANEOUS | Status: DC | PRN

## 2012-08-01 MED ORDER — MUSCLE RUB 10-15 % EX CREA
TOPICAL_CREAM | CUTANEOUS | Status: DC | PRN
  Administered 2012-08-01 – 2012-08-02 (×3): via TOPICAL
  Filled 2012-08-01: qty 85

## 2012-08-01 NOTE — ED Notes (Signed)
CALLED [facility] IN [place]. SPOKE WITH TWILLA AND SHE ADVISES PT POWER CHAIR IS THERE "DOWNSTAIRS". SHE ADVISES [facility] F[facility] AND [facility]ED. SHE ASKES THAT SOMEONE CALL BACK ON Monday MORNING AND TALK TO THE BUSINESS OFFICE CONCERNING THE REST OF HIS BELONGINGS AND CHECKS. THE NUMBER IS [phone]

## 2012-08-01 NOTE — ED Notes (Signed)
Social worker aware pt would like placement, is coming down to speak with patient

## 2012-08-01 NOTE — ED Notes (Signed)
Patient resting with eyes closed.sitter at bedside. 

## 2012-08-01 NOTE — BHH Counselor (Signed)
Pt advised writer that he is not confirming any SI, HI, AVH and said "No Ma'am, I'm ok I ain't having no thoughts". Pt said "I'm supposed to go back to [place] and the paperwork has all ready been done". Called [facility] and spoke with Kerby Nora [phone] and was told "I haven't even look at his paperwork". Writer reminded him that the pt's paperwork was faxed on 4/18 and Kerby Nora said "call me back in 1 hour and I should know something by then". Denice Bors, AADC 08/01/2012 12:55 PM

## 2012-08-01 NOTE — ED Notes (Signed)
cbg 146 

## 2012-08-01 NOTE — ED Notes (Signed)
Patient resting in bed with eyes close, sitter at bedside

## 2012-08-01 NOTE — Clinical Social Work Note (Signed)
Weekend CSW told patient is a former resident at [facility] in [place], [phone]. CSW contacted facility. Staff stating unable to take patient over the weekend and will need to call back Monday.  Ricke Hey, [place] [phone] (weekend)

## 2012-08-01 NOTE — ED Notes (Signed)
[facility]ed [facility]. They advise the pt cannot come back there because he would call 911 daily. Per twilla

## 2012-08-01 NOTE — ED Notes (Signed)
After much discussion with pt. He is denying si/hi but states he will be that way if it keeps him off the street. He is also willing to accept an assisted living. His biggest [facility] and have [facility] him in an assisted living. Act is Production manager at this time. Pt awaiting telepsych reeval

## 2012-08-01 NOTE — ED Notes (Signed)
PT COMPLAINING THAT HIS TOENAILS NEED TO BE CUT. EXPLAINED TO PT THAT WE DO NOT CUT NAILS IN THE EMERGENCY DEPARTMENT. PT UNHAPPY WITH EXPLANATION. DR Radford Pax MADE AWARE OF PT REQUEST.

## 2012-08-01 NOTE — ED Notes (Addendum)
ACT TEAM IN TO TALK WITH PT AND REEVAL. PT CAN BE HEARD TELLING RITA THAT HE IS NOT SUICIDAL OR HEARING VOICES. THAT HE IS "ALL RIGHT"

## 2012-08-01 NOTE — ED Notes (Signed)
cbg 131 prior to lunch

## 2012-08-01 NOTE — BHH Counselor (Signed)
Writer left message for Loran Senters [phone] to discuss pt placement, as the pt is not si, hi or avh. Denice Bors, AADC 08/01/2012 3:05 PM

## 2012-08-01 NOTE — ED Provider Notes (Signed)
telepsych has evaluated pt and states is psychiatrically stable for d/c, the psych md reversed ivc, and states will need sw eval for placement.  Pt alert, content, cooperative, oriented. Pt agreesable to ecf placement. Given age, general deconditioning and mobility issues, do not feel shelter is reasonable option for this patient. Pt states no family or friends in area with whom he could stay. Pt previous at a [facility] in [place]. Discussed pt with SW, they will work on placement of patient.      Suzi Roots, MD 08/01/12 1739

## 2012-08-02 LAB — GLUCOSE, CAPILLARY
Glucose-Capillary: 158 mg/dL — ABNORMAL HIGH (ref 70–99)
Glucose-Capillary: 138 mg/dL — ABNORMAL HIGH (ref 70–99)

## 2012-08-02 NOTE — ED Notes (Signed)
Pt.sitting his wheelchair with sitter at side

## 2012-08-02 NOTE — ED Notes (Signed)
Sitter at bedside.

## 2012-08-03 LAB — GLUCOSE, CAPILLARY
Glucose-Capillary: 140 mg/dL — ABNORMAL HIGH (ref 70–99)
Glucose-Capillary: 113 mg/dL — ABNORMAL HIGH (ref 70–99)

## 2012-08-03 NOTE — ED Notes (Signed)
Received report on patient, patient sleeping at this time in NAD, patient with sitter stationed at room,

## 2012-08-03 NOTE — ED Notes (Signed)
Patient being assisted to restroom at this time by sitter staff, NAD noted

## 2012-08-03 NOTE — Progress Notes (Signed)
CSW received a VM from the [facility] in [place], [place] concerning referral made to that facility.   CSW was unsuccessful in getting in touch with caller and left another message regarding referral.   Office worker took down contact information and will give to [employer] Rep for facility.   CSW to follow for assistance with placement.    Leron Croak, LCSWA [facility] Emergency Dept.  [phone]

## 2012-08-03 NOTE — ED Provider Notes (Signed)
2:57 PM Filed Vitals:   08/03/12 1144  BP: 143/75  Pulse: 59  Temp: 98 F (36.7 C)  Resp: 20    The patient has been seen and evaluated by the Child psychotherapist.  All outpatient social work resources have been exhausted and ultimately the patient will likely need to go to a homeless shelter.  All resources been given.  All possible and patient options have been evaluated including skilled nursing facility of which is not a candidate.  The patient does not appear to be a threat to himself or to others.  There is no acute psychiatric issues.  His issues are all social work related.  Social work has been intimately involved and has been extremely helpful in this situation we appreciate their assistance.   Lyanne Co, MD 08/03/12 1459

## 2012-08-03 NOTE — ED Notes (Signed)
Patient sleeping at this time, breakfast at bedside when patient awakes

## 2012-08-03 NOTE — ED Notes (Signed)
Spoke with house coverage to cancel sitter--

## 2012-08-03 NOTE — ED Notes (Signed)
Patient resting with eyes closed at this time,easily arousable,  sitter at bedside, NAD noted.

## 2012-08-03 NOTE — Progress Notes (Signed)
CSW has been working with Pt and nurse to refer Pt to SNF for placement. Pt no longer endorses SI/HI and stated he would just like placement in a nursing facility because he "could not make it on his own."   CSW has faxed Pt out to [facility]. CSW has spoken with Daphene Jaeger [phone]) and referred to [facility], [place], [place] on Friday. CSW left a message for Director of Admission for facility this a.m. And is awaiting a call back concerning placement. Pt is a Best boy and would prefer to go to that facility if possible.   CSW will continue to work on placement in SNF for d/c planning.    Leron Croak, LCSWA [facility] Emergency Dept.  [phone]

## 2012-08-03 NOTE — ED Notes (Signed)
Patient sleeping at this time, NAD noted, sitter at bedside

## 2012-08-03 NOTE — Progress Notes (Addendum)
CSW went in to speak with the Pt concernng difficulty with placement into a SNF facility. Pt stated that he "will not go to a shelter" and that he "wants to go back to the [place]". CSW informed Pt that he does not meet the qualifications for inpt Psych and that he has been cleared." Pt then stated "then where am I going to go"? CSW mentioned that CSW would provide some resources for the Pt for d/c. Pt refuses to go anywhere other than the [facility]. The Pt then stated "well I guess I'm suicidal/homicidal". CSW asked Pt to clarify which he felt he was and Pt's response was "I guess both". CSW asked Pt who he was "homicidal" towards? Pt stated "I don't know yet" but I'm "homicidal/suicidal".  Pt has been wavering his declaration of SI/HI the entire admission based on his desired needs at the time and if the Pt feels like he is about to be d/c'd Pt then changes his mind back toward SI/HI thoughts.   CSW and ACT team met with the MD to discuss Pt refusals and new declaration of SI/HI. It was the conclusion of the CSW, ACT team, and MD that the Pt is malingering. Decision was made by MD, CSW, ACT team to d/c Pt with resources. CSW will assist Pt with obtaining resources and call prior facility for Pt belongings.   CSW will give report to oncoming CSW to follow up with d/c.   CSW notified supervisor of d/c order and assistance with d/c.    Leron Croak, LCSWA [facility] Emergency Dept.  [phone]

## 2012-08-03 NOTE — ED Notes (Signed)
Patient assisted to restroom per sitting staff, patient transfers to wheelchair under own power, patient in NAD at this time, patient cal and cooperative.  Sitter at bedside

## 2012-08-03 NOTE — ED Notes (Signed)
Has not been seen by physical therapy at present-- case manager aware,

## 2012-08-04 NOTE — ED Notes (Signed)
Physical therapy at bedside to see patient. Pt sitting on side of bed, pt brief changed due to urinary incontinence. Care meeting with Dr. Freida Busman. Reporting patient to be discharged after he eats his breakfast. Pt cooperative at this time.

## 2012-08-04 NOTE — ED Notes (Addendum)
Pt remains agitated when this RN entered room to obtain vital signs. Upon this RN leaving room, pt de-escalated and laid down in bed. Respirations Even and unlabored, pt in NAD at this time.

## 2012-08-04 NOTE — ED Notes (Signed)
Pt belongings returned to him. Clothes wet/soiled. Social work to be getting him dry clothes. Pt refused shower or bath despite malodorous aroma. Pt has been given breakfast and 2 cups of coffee.

## 2012-08-04 NOTE — ED Notes (Signed)
Pt left with a stack of paper resources. Given information for shelters tonight. Verbalized understanding of instructions to be at PART station tomorrow at 0700 to take bus to [place] in [place]. Given 2 bus passes and $6.00 for the PART bus. Pt provided a new pair of pants due to all his clothes being soiled in urine. Pt did not want to take his soiled clothes with him and he threw them in the trash. Pt requesting rx for med, EDP reporting no, he can get these tomorrow at [facility]. Pt cooperative at discharge. Taken to bus stop in his wheelchair by security.

## 2012-08-04 NOTE — Progress Notes (Signed)
CSW received referral to assist with transportation.  Pt provided with 2 bus passes and $6 dollars to catch the Part Bus to [facility] in [place].   Pt was informed that the [facility] will assist him any way they can. Pt was given the name and number for the [facility] Team Social Worker Denny Peon [place] [phone]) to assist with any additional needs. Pt does have a claim with the VA Administration, however is not a service connected [name] and is not to believed to have even served during war time. [name] will need additional documentation to be considered for assistance with SNF/ALF facilities.   CSW provided Pt with a listing of free meals in the local area and homeless shelters in [place], [place]. Pt was given information about Independent living services in the [place] [facility].   CSW attempted to explain this information to the Pt, however Pt was not being cooperative and security was called to assist with escorting Pt out of the facility.   No further CSW needs identified.  Leron Croak, LCSWA [employer] [phone]

## 2012-08-04 NOTE — ED Notes (Signed)
EMT entered room and asked pt if it would be all right to take his vital signs. Pt responded, "Goddamn, it's 12:00 midnight!"  Pt was told that we needed vital signs and that they would not be taken again until 6:00, but that it was our job to take them now. Pt said, "It's not my job, goddamn it!" Pt repeatedly yelled obscenities, and so vital signs were not obtained.

## 2012-08-04 NOTE — Progress Notes (Addendum)
PHYSCICAL THERAPY EVALUATION NOTE 08/04/12 0800  PT Visit Information  Last PT Received On 08/04/12  Assistance Needed +1  PT Time Calculation  PT Start Time 0830  PT Stop Time 0853  PT Time Calculation (min) 23 min  Subjective Data  Subjective I cant make it on my own.    Patient Stated Goal D/c to ALF or SNF.    Precautions  Precautions None  Restrictions  Weight Bearing Restrictions No  Home Living  Lives With Other (Comment) (Homeless)  Available Help at Discharge Other (Comment) (NONE)  Type of Home Homeless  Home Adaptive Equipment Wheelchair - manual  Additional Comments Pt reports that he was living in [place] facility psych facility.  RN reports pt was homeless.    Prior Function  Level of Independence Needs assistance (Mostly non ambulatory)  Needs Assistance Gait  Able to Take Stairs? No  Driving No  Vocation On disability  Comments Pt reporting wheel chair was his primary mode of mobility prior to admission.   Communication  Communication No difficulties  Cognition  Arousal/Alertness Awake/alert  Behavior During Therapy WFL for tasks assessed/performed  Overall Cognitive Status Impaired/Different from baseline  Area of Impairment Awareness  General Comments Pt reports being a [place] veteran qualified for 100% disability from [employer]  Pt is not a veteran and was not living in a [place] facility prior to admission.  Unclear if pt is being deceitful  Difficult to assess due to (Pt untruthful about his situation.  Could be delusional)  Right Upper Extremity Assessment  RUE ROM/Strength/Tone WFL  Left Upper Extremity Assessment  LUE ROM/Strength/Tone WFL  Right Lower Extremity Assessment  RLE ROM/Strength/Tone WFL  Left Lower Extremity Assessment  LLE ROM/Strength/Tone WFL  Trunk Assessment  Trunk Assessment Kyphotic  Bed Mobility  Bed Mobility Supine to Sit;Sit to Supine  Supine to Sit 7: Independent  Sit to Supine 7: Independent  Transfers  Transfers Sit to  Stand;Stand to Sit;Stand Pivot Transfers  Sit to Stand 7: Independent  Stand to Sit 7: Independent  Stand Pivot Transfers 6: Modified independent (Device/Increase time)  Details for Transfer Assistance no assistance required.  Assisted pt to pull up his pants in standing.    Ambulation/Gait  Ambulation/Gait Assistance 5: Supervision  Ambulation Distance (Feet) 50 Feet  Assistive device Rolling walker  Ambulation/Gait Assistance Details supervision for safety  no assist needed.   Gait Pattern Trunk flexed  Stairs No  Engineering geologist No  Balance  Balance Assessed Yes  Static Sitting Balance  Static Sitting - Balance Support Feet supported  Static Sitting - Level of Assistance 7: Independent  Static Standing Balance  Static Standing - Balance Support Bilateral upper extremity supported  Static Standing - Level of Assistance 6: Modified independent (Device/Increase time)  Static Standing - Comment/# of Minutes 1+ minutes with no LOB  Dynamic Standing Balance  Dynamic Standing - Level of Assistance 6: Modified independent (Device/Increase time)  Dynamic Standing - Balance Activities Forward lean/weight shifting;Reaching across midline;Lateral lean/weight shifting  PT - End of Session  Equipment Utilized During Treatment Gait belt  Activity Tolerance Patient tolerated treatment well  Patient left in bed  Nurse Communication Mobility status  PT Assessment  Clinical Impression Statement Mr. Naveed is a 72 y/o male who reports being in [place] facility for psych. Pt reports being a [place] [name] and needing to be admitted into a nursing home because he cannot take care of himself.  RN reports that pt is not a [place]  Vet and is homeless.  Unclear if pt is delusional or deceitful.  Pt appears to be functioning at his baseline level.  No accute PT needs at this time.  No equipment or follow-up PT recommended.  Acute PT signing off.     PT Recommendation/Assessment Patent  does not need any further PT services  No Skilled PT Patient at baseline level of functioning  PT Recommendation  Follow Up Recommendations No PT follow up  PT equipment None recommended by PT  PT G-Codes **NOT FOR INPATIENT CLASS**  Functional Assessment Tool Used clinical judgement  Functional Limitation Mobility: Walking and moving around  Mobility: Walking and Moving Around Current Status (W0981) CI  Mobility: Walking and Moving Around Goal Status (X9147) CI  Mobility: Walking and Moving Around Discharge Status (W2956) CI  PT General Charges  $$ ACUTE PT VISIT 1 Procedure  PT Evaluation  $Initial PT Evaluation Tier I 1 Procedure  PT Treatments  $Therapeutic Activity 23-37 mins  Natayah Warmack L. Janace Decker DPT [phone]

## 2012-08-04 NOTE — Progress Notes (Signed)
CSW received a call from Pt's social worker Denny Peon [place] [phone] ext [phone]) at the [facility]. [name] had requested to leave the [facility] stating that he would "handle the housing issues on his own". Social worker stated that their facility d/c'd veteran to the [employer].   CSW explained to the [place] social worker the d/c plan from this ED so that documentation could be made about his resources and plan. CSW also informed social worker that her number and information was also given to Pt for contacting for further assistance if needed. [facility] social worker was appreciative for assistance with veteran and information.   No further assistance is needed at this time.   Leron Croak, LCSWA [facility] Emergency Dept.  [phone]

## 2012-08-05 ENCOUNTER — Encounter (HOSPITAL_COMMUNITY): Payer: Self-pay | Admitting: Emergency Medicine

## 2012-08-05 ENCOUNTER — Emergency Department (HOSPITAL_COMMUNITY)
Admission: EM | Admit: 2012-08-05 | Discharge: 2012-08-06 | Disposition: A | Discharge status: Home / Self Care | Payer: Medicare Other | Attending: Emergency Medicine | Admitting: Emergency Medicine

## 2012-08-05 DIAGNOSIS — F918 Other conduct disorders: Secondary | ICD-10-CM | POA: Insufficient documentation

## 2012-08-05 DIAGNOSIS — Z79899 Other long term (current) drug therapy: Secondary | ICD-10-CM | POA: Insufficient documentation

## 2012-08-05 DIAGNOSIS — G8929 Other chronic pain: Secondary | ICD-10-CM | POA: Insufficient documentation

## 2012-08-05 DIAGNOSIS — Z8669 Personal history of other diseases of the nervous system and sense organs: Secondary | ICD-10-CM | POA: Insufficient documentation

## 2012-08-05 DIAGNOSIS — F329 Major depressive disorder, single episode, unspecified: Secondary | ICD-10-CM | POA: Insufficient documentation

## 2012-08-05 DIAGNOSIS — Z8659 Personal history of other mental and behavioral disorders: Secondary | ICD-10-CM | POA: Insufficient documentation

## 2012-08-05 DIAGNOSIS — E119 Type 2 diabetes mellitus without complications: Secondary | ICD-10-CM | POA: Insufficient documentation

## 2012-08-05 DIAGNOSIS — F29 Unspecified psychosis not due to a substance or known physiological condition: Secondary | ICD-10-CM | POA: Insufficient documentation

## 2012-08-05 DIAGNOSIS — Z7982 Long term (current) use of aspirin: Secondary | ICD-10-CM | POA: Insufficient documentation

## 2012-08-05 DIAGNOSIS — R456 Violent behavior: Secondary | ICD-10-CM

## 2012-08-05 DIAGNOSIS — F3289 Other specified depressive episodes: Secondary | ICD-10-CM | POA: Insufficient documentation

## 2012-08-05 DIAGNOSIS — I10 Essential (primary) hypertension: Secondary | ICD-10-CM | POA: Insufficient documentation

## 2012-08-05 DIAGNOSIS — F17200 Nicotine dependence, unspecified, uncomplicated: Secondary | ICD-10-CM | POA: Insufficient documentation

## 2012-08-05 LAB — URINALYSIS, ROUTINE W REFLEX MICROSCOPIC
Bilirubin Urine: NEGATIVE
Glucose, UA: 100 mg/dL — AB
Hgb urine dipstick: NEGATIVE
Ketones, ur: NEGATIVE mg/dL
Protein, ur: NEGATIVE mg/dL

## 2012-08-05 LAB — COMPREHENSIVE METABOLIC PANEL
ALT: 15 U/L (ref 0–53)
AST: 17 U/L (ref 0–37)
CO2: 18 mEq/L — ABNORMAL LOW (ref 19–32)
Chloride: 91 mEq/L — ABNORMAL LOW (ref 96–112)
GFR calc non Af Amer: >90 mL/min (ref >90)
Glucose, Bld: 128 mg/dL — ABNORMAL HIGH (ref 70–99)
Sodium: 126 mEq/L — ABNORMAL LOW (ref 135–145)
Total Bilirubin: 0.3 mg/dL (ref 0.3–1.2)

## 2012-08-05 LAB — RAPID URINE DRUG SCREEN, HOSP PERFORMED
Amphetamines: NOT DETECTED
Tetrahydrocannabinol: NOT DETECTED

## 2012-08-05 LAB — CBC
Hemoglobin: 13.9 g/dL (ref 13.0–17.0)
MCV: 79.2 fL (ref 78.0–100.0)
Platelets: 270 10*3/uL (ref 150–400)
RBC: 5.1 MIL/uL (ref 4.22–5.81)
WBC: 12.1 10*3/uL — ABNORMAL HIGH (ref 4.0–10.5)

## 2012-08-05 NOTE — ED Provider Notes (Signed)
History     CSN: [id number]  Arrival date & time 08/05/12  2009   First MD Initiated Contact with Patient 08/05/12 2029      Chief Complaint  Patient presents with  . Suicidal    (Consider location/radiation/quality/duration/timing/severity/associated sxs/prior treatment) HPI The patient presents today as after recent discharge from this facility now with concerns of suicidal ideation.  Per report the patient was found a bus stop, requesting transport to the [place].  EMS reports that the patient's response when he was informed that he would not be transferred to another city was to describe suicidal ideation. To me, the patient states that he is suicidal, depressed, wants assistance. He denies new physical complaints, but states that he has chronic pain, has not taken any medication in some time. He denies new fevers, chills, alcohol or drug use.   Past Medical History  Diagnosis Date  . Diabetes mellitus   . Hypertension   . Neuropathy   . Mental disorder   . Depression   . Anxiety     History reviewed. No pertinent past surgical history.  History reviewed. No pertinent family history.  History  Substance Use Topics  . Smoking status: Current Every Day Smoker -- 2.00 packs/day    Types: Cigarettes  . Smokeless tobacco: Never Used  . Alcohol Use: 2.4 oz/week    4 Cans of beer per week     Comment: Pt reported that he dranked 3 beers      Review of Systems  Unable to perform ROS: Psychiatric disorder    Allergies  Nicoderm  Home Medications   Current Outpatient Rx  Name  Route  Sig  Dispense  Refill  . aspirin 81 MG tablet   Oral   Take 81 mg by mouth daily.         . fish oil-omega-3 fatty acids 1000 MG capsule   Oral   Take 2 g by mouth daily.         Marland Kitchen glipiZIDE (GLUCOTROL) 10 MG tablet   Oral   Take 10 mg by mouth 2 (two) times daily before a meal.         . hydroxypropyl methylcellulose (ISOPTO TEARS) 2.5 % ophthalmic solution   Both  Eyes   Place 1 drop into both eyes 4 (four) times daily as needed (for dry eyes).         . metFORMIN (GLUCOPHAGE) 500 MG tablet   Oral   Take 500 mg by mouth 2 (two) times daily with a meal.         . Oxycodone HCl 10 MG TABS   Oral   Take 10 mg by mouth 4 (four) times daily as needed (for pain).         Marland Kitchen trolamine salicylate (ASPERCREME) 10 % cream   Topical   Apply 1 application topically as needed (to arms and legs).           BP 115/70  Pulse 66  Temp(Src) 97.9 F (36.6 C) (Oral)  Resp 22  SpO2 97%  Physical Exam  Constitutional: He is oriented to person, place, and time. He appears well-developed and well-nourished.  HENT:  Head: Normocephalic and atraumatic.  No evidence of injury or trauma  Eyes: EOM are normal. Pupils are equal, round, and reactive to light.  Neck: Neck supple.  No cervical spine tenderness or deformity  Cardiovascular: Regular rhythm and intact distal pulses.   Pulmonary/Chest: Effort normal. No respiratory distress.  Abdominal: Soft.  Bowel sounds are normal. He exhibits no distension. There is no tenderness. There is no rebound and no guarding.  Musculoskeletal: Normal range of motion. He exhibits no edema.  Neurological: He is alert and oriented to person, place, and time.  Moves his upper extremities spontaneously. Speech is clear. No focal deficits.  Skin: Skin is warm and dry.  Psychiatric: His mood appears anxious. His affect is inappropriate. His speech is rapid and/or pressured. He is agitated, aggressive, hyperactive and combative. Thought content is delusional. Cognition and memory are impaired. He expresses suicidal ideation. He expresses no suicidal plans.    ED Course  Procedures (including critical care time)  After the initial evaluation I reviewed the patient's chart, including recent evaluation by our social worker and behavioral health colleagues.  It is clear from those evaluations, the patient has exhausted multiple  resources offered to him for assistance.  This presentation seems consistent with multiple prior similar evaluations.   Labs Reviewed  CBC - Abnormal; Notable for the following:    WBC 12.1 (*)    RDW 16.5 (*)    All other components within normal limits  COMPREHENSIVE METABOLIC PANEL - Abnormal; Notable for the following:    Sodium 126 (*)    Chloride 91 (*)    CO2 18 (*)    Glucose, Bld 128 (*)    All other components within normal limits  ETHANOL - Abnormal; Notable for the following:    Alcohol, Ethyl (B) 280 (*)    All other components within normal limits  GLUCOSE, CAPILLARY - Abnormal; Notable for the following:    Glucose-Capillary 130 (*)    All other components within normal limits  URINE RAPID DRUG SCREEN (HOSP PERFORMED)  URINALYSIS, ROUTINE W REFLEX MICROSCOPIC   No results found.   1. Violent behavior     After the initial evaluation and attempt to obtain urine and blood for expedited evaluation, and consult with her psychiatric complaints, the patient became particularly combative, striking multiple aides, verbally assaulting, with racial slurs other members of the staff. Given this violent behavior, his aggression toward staff, he was discharged into the custody of police  MDM  This elderly male presents initially with request for assistance with suicidal ideation, but during his evaluation comes combative, violence toward staff members, verbally aggressive.  Though the patient does seem to have psychiatric issues, it is clear from prior evaluations, that he is noncompliant with medication, has exhausted multiple prior resources provided to him for assistance.  Given his new violence towards staff, he was discharged into police custody.        Gerhard Munch, MD 08/05/12 2258

## 2012-08-05 NOTE — ED Notes (Signed)
JXB:[id number]<GN> Expected date:08/05/12<BR> Expected time: 7:52 PM<BR> Means of arrival:Ambulance<BR> Comments:<BR> 72 yo M  Suicidal ideations/ETOH

## 2012-08-05 NOTE — ED Notes (Signed)
[facility] picked pt up bus stop [address] [facility]. Pt requested to go to [place]. Pt stated that if he wasn't taken somewhere he would kill himself. CBG 118. Per [facility].

## 2012-08-06 ENCOUNTER — Emergency Department (HOSPITAL_COMMUNITY): Payer: Medicare Other

## 2012-08-06 ENCOUNTER — Emergency Department (HOSPITAL_COMMUNITY)
Admission: EM | Admit: 2012-08-06 | Discharge: 2012-08-06 | Disposition: A | Discharge status: Home / Self Care | Payer: Medicare Other | Attending: Emergency Medicine | Admitting: Emergency Medicine

## 2012-08-06 DIAGNOSIS — Y9210 Unspecified residential institution as the place of occurrence of the external cause: Secondary | ICD-10-CM | POA: Insufficient documentation

## 2012-08-06 DIAGNOSIS — S99919A Unspecified injury of unspecified ankle, initial encounter: Secondary | ICD-10-CM | POA: Insufficient documentation

## 2012-08-06 DIAGNOSIS — F411 Generalized anxiety disorder: Secondary | ICD-10-CM | POA: Insufficient documentation

## 2012-08-06 DIAGNOSIS — M25561 Pain in right knee: Secondary | ICD-10-CM

## 2012-08-06 DIAGNOSIS — F3289 Other specified depressive episodes: Secondary | ICD-10-CM | POA: Insufficient documentation

## 2012-08-06 DIAGNOSIS — M25551 Pain in right hip: Secondary | ICD-10-CM

## 2012-08-06 DIAGNOSIS — Z7982 Long term (current) use of aspirin: Secondary | ICD-10-CM | POA: Insufficient documentation

## 2012-08-06 DIAGNOSIS — S79919A Unspecified injury of unspecified hip, initial encounter: Secondary | ICD-10-CM | POA: Insufficient documentation

## 2012-08-06 DIAGNOSIS — S8990XA Unspecified injury of unspecified lower leg, initial encounter: Secondary | ICD-10-CM | POA: Insufficient documentation

## 2012-08-06 DIAGNOSIS — S199XXA Unspecified injury of neck, initial encounter: Secondary | ICD-10-CM | POA: Insufficient documentation

## 2012-08-06 DIAGNOSIS — I10 Essential (primary) hypertension: Secondary | ICD-10-CM | POA: Insufficient documentation

## 2012-08-06 DIAGNOSIS — F329 Major depressive disorder, single episode, unspecified: Secondary | ICD-10-CM | POA: Insufficient documentation

## 2012-08-06 DIAGNOSIS — Z8659 Personal history of other mental and behavioral disorders: Secondary | ICD-10-CM | POA: Insufficient documentation

## 2012-08-06 DIAGNOSIS — Z981 Arthrodesis status: Secondary | ICD-10-CM | POA: Insufficient documentation

## 2012-08-06 DIAGNOSIS — E119 Type 2 diabetes mellitus without complications: Secondary | ICD-10-CM | POA: Insufficient documentation

## 2012-08-06 DIAGNOSIS — Z8669 Personal history of other diseases of the nervous system and sense organs: Secondary | ICD-10-CM | POA: Insufficient documentation

## 2012-08-06 DIAGNOSIS — S0993XA Unspecified injury of face, initial encounter: Secondary | ICD-10-CM | POA: Insufficient documentation

## 2012-08-06 DIAGNOSIS — Z79899 Other long term (current) drug therapy: Secondary | ICD-10-CM | POA: Insufficient documentation

## 2012-08-06 DIAGNOSIS — W050XXA Fall from non-moving wheelchair, initial encounter: Secondary | ICD-10-CM | POA: Insufficient documentation

## 2012-08-06 DIAGNOSIS — Y939 Activity, unspecified: Secondary | ICD-10-CM | POA: Insufficient documentation

## 2012-08-06 DIAGNOSIS — F17200 Nicotine dependence, unspecified, uncomplicated: Secondary | ICD-10-CM | POA: Insufficient documentation

## 2012-08-06 DIAGNOSIS — M542 Cervicalgia: Secondary | ICD-10-CM

## 2012-08-06 MED ORDER — OXYCODONE-ACETAMINOPHEN 5-325 MG PO TABS
2.0000 | ORAL_TABLET | Freq: Once | ORAL | Status: AC
  Administered 2012-08-06: 2 via ORAL
  Filled 2012-08-06: qty 2

## 2012-08-06 NOTE — ED Notes (Signed)
Pt became verbally and physically abusive to staff and was discharged and escorted to bus stop via [employer] and security.

## 2012-08-06 NOTE — ED Notes (Signed)
Pt given bus ticket and taken to bus stop per wheel chair.

## 2012-08-06 NOTE — ED Notes (Signed)
Pt tripped over his wheelchair, falling onto right side onto the pavement. States he lives at the [facility] in [place]. Hx of cervical fusion. C/o neck pain, right upper chest/shoulder pain and right hip pain.

## 2012-08-06 NOTE — ED Provider Notes (Signed)
History     CSN: [id number]  Arrival date & time 08/06/12  0932   First MD Initiated Contact with Patient 08/06/12 1035      Chief Complaint  Patient presents with  . Fall    (Consider location/radiation/quality/duration/timing/severity/associated sxs/prior treatment) Patient is a 72 y.o. male presenting with fall. The history is provided by the patient. No language interpreter was used.  Fall Pertinent negatives include no fever, no abdominal pain and no headaches.  Pt is a 72yo male c/o right shoulder, hip and knee pain after losing his balance and falling on concrete.  Pt states he tripped over his wheelchair, falling onto pavement.  Lives at [facility] ward in [place].  Pt report hx of cervical fusion 6-49yr ago. Denies hitting his head or losing consciousness.  Denies chest pain, abd pain, or sob.  Poor historian.  States he is suppose to be on "medications" but he is not taking them.    Pt reports being admitted last week for SI but denies SI/HI at this time.  States he just wants to get back to the [place] in [place] as soon as he can.   Past Medical History  Diagnosis Date  . Diabetes mellitus   . Hypertension   . Neuropathy   . Mental disorder   . Depression   . Anxiety     No past surgical history on file.  No family history on file.  History  Substance Use Topics  . Smoking status: Current Every Day Smoker -- 2.00 packs/day    Types: Cigarettes  . Smokeless tobacco: Never Used  . Alcohol Use: 2.4 oz/week    4 Cans of beer per week     Comment: Pt reported that he dranked 3 beers      Review of Systems  Constitutional: Negative for fever and chills.  Cardiovascular: Negative for chest pain.  Gastrointestinal: Negative for abdominal pain.  Musculoskeletal: Positive for myalgias, arthralgias and gait problem.  Skin: Negative.   Neurological: Negative for weakness, light-headedness and headaches.    Allergies  Nicoderm  Home Medications   Current  Outpatient Rx  Name  Route  Sig  Dispense  Refill  . aspirin 81 MG tablet   Oral   Take 81 mg by mouth daily.         . fish oil-omega-3 fatty acids 1000 MG capsule   Oral   Take 2 g by mouth daily.         Marland Kitchen glipiZIDE (GLUCOTROL) 10 MG tablet   Oral   Take 10 mg by mouth 2 (two) times daily before a meal.         . hydroxypropyl methylcellulose (ISOPTO TEARS) 2.5 % ophthalmic solution   Both Eyes   Place 1 drop into both eyes 4 (four) times daily as needed (for dry eyes).         . metFORMIN (GLUCOPHAGE) 500 MG tablet   Oral   Take 500 mg by mouth 2 (two) times daily with a meal.         . Oxycodone HCl 10 MG TABS   Oral   Take 10 mg by mouth 4 (four) times daily as needed (for pain).         . traZODone (DESYREL) 100 MG tablet   Oral   Take 100 mg by mouth at bedtime.         . trolamine salicylate (ASPERCREME) 10 % cream   Topical   Apply 1 application topically  as needed (to arms and legs).           BP 117/67  Pulse 68  Resp 20  SpO2 95%  Physical Exam  Nursing note and vitals reviewed. Constitutional: He is oriented to person, place, and time. He appears well-developed and well-nourished. No distress.  Pt lying on exam bed with lights out when i entered the room   HENT:  Head: Normocephalic and atraumatic.  Right Ear: External ear normal.  Left Ear: External ear normal.  Nose: Nose normal.  Mouth/Throat: Oropharynx is clear and moist. No oropharyngeal exudate.  Eyes: Conjunctivae are normal. Pupils are equal, round, and reactive to light. No scleral icterus.  Neck: No JVD present. Spinous process tenderness and muscular tenderness present. Decreased range of motion ( hx cervical fusion) present. No tracheal deviation present. No thyromegaly present.    Cardiovascular: Normal rate, regular rhythm and normal heart sounds.   Pulmonary/Chest: Effort normal and breath sounds normal. No stridor. No respiratory distress. He has no wheezes. He  has no rales. He exhibits no tenderness.  Abdominal: Soft. Bowel sounds are normal. He exhibits no distension. There is no tenderness.  Musculoskeletal: He exhibits tenderness ( ttp across base of neck and right upper trapezius muscle.  TTP over right hip and right knee). He exhibits no edema.  Lymphadenopathy:    He has no cervical adenopathy.  Neurological: He is alert and oriented to person, place, and time.  CN II-XII in tact, nl sensation, 5/5 grip strength, neg romberg, antalgic gait.    Skin: Skin is warm and dry. He is not diaphoretic.  Psychiatric: He has a normal mood and affect. His speech is normal and behavior is normal. Judgment and thought content normal. Cognition and memory are normal.    ED Course  Procedures (including critical care time)  Labs Reviewed - No data to display Dg Shoulder Right  08/06/2012  *RADIOLOGY REPORT*  Clinical Data: Pain post trauma  RIGHT SHOULDER - 2+ VIEW  Comparison: November 19, 2008  Findings:  Internal rotation, external rotation, and Y scapular images were obtained.  Fracture or dislocation.  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  No erosive change or intra-articular calcifications.  IMPRESSION: No fracture or dislocation.  Moderate generalized osteoarthritic change, stable.   Original Report Authenticated By: Bretta Bang, M.D.    Dg Hip Complete Right  08/06/2012  *RADIOLOGY REPORT*  Clinical Data: Fall  RIGHT HIP - COMPLETE 2+ VIEW  Comparison: None.  Findings: Normal alignment and no fracture.  Mild degenerative change in the hip joint with very mild joint space narrowing.  IMPRESSION: Negative for fracture.   Original Report Authenticated By: Janeece Riggers, M.D.    Ct Head Wo Contrast  08/06/2012  *RADIOLOGY REPORT*  Clinical Data:  Fall.  Neck pain  CT HEAD WITHOUT CONTRAST CT CERVICAL SPINE WITHOUT CONTRAST  Technique:  Multidetector CT imaging of the head and cervical spine was performed following the standard  protocol without intravenous contrast.  Multiplanar CT image reconstructions of the cervical spine were also generated.  Comparison:  CT head 05/04/2010  CT HEAD  Findings: Generalized atrophy.  Chronic microvascular ischemic changes in the white matter.  No acute infarct.  Negative for hemorrhage or mass.  No midline shift.  Negative for skull fracture.  Chronic nasal bone fracture with displacement of the nose to the left is similar to the prior study.  IMPRESSION: Atrophy and chronic microvascular ischemic change.  No acute abnormality.  CT CERVICAL SPINE  Findings: ACDF C4-5, ACDF C4-C7 with anterior plate extending from C4-C7.  There appears to be pseudoarthrosis with persistent lucency through the disc spaces at C4-5 and C6-7.  Solid fusion at C5-6.  Disc degeneration and spondylosis at C3-4.  There is bilateral facet hypertrophy and foraminal encroachment bilaterally.  There is a right foraminal encroachment due to spurring at C4-5, right greater than left.  Mild foraminal encroachment bilaterally at C5- 6.  There is moderate foraminal narrowing on the right and C6-7.  Negative for fracture.  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  There is extensive cervical spondylosis and foraminal encroachment.  Negative for fracture.   Original Report Authenticated By: Janeece Riggers, M.D.    Ct Cervical Spine Wo Contrast  08/06/2012  *RADIOLOGY REPORT*  Clinical Data:  Fall.  Neck pain  CT HEAD WITHOUT CONTRAST CT CERVICAL SPINE WITHOUT CONTRAST  Technique:  Multidetector CT imaging of the head and cervical spine was performed following the standard protocol without intravenous contrast.  Multiplanar CT image reconstructions of the cervical spine were also generated.  Comparison:  CT head 05/04/2010  CT HEAD  Findings: Generalized atrophy.  Chronic microvascular ischemic changes in the white matter.  No acute infarct.  Negative for hemorrhage or mass.  No midline shift.  Negative for skull  fracture.  Chronic nasal bone fracture with displacement of the nose to the left is similar to the prior study.  IMPRESSION: Atrophy and chronic microvascular ischemic change.  No acute abnormality.  CT CERVICAL SPINE  Findings: ACDF C4-5, ACDF C4-C7 with anterior plate extending from C4-C7.  There appears to be pseudoarthrosis with persistent lucency through the disc spaces at C4-5 and C6-7.  Solid fusion at C5-6.  Disc degeneration and spondylosis at C3-4.  There is bilateral facet hypertrophy and foraminal encroachment bilaterally.  There is a right foraminal encroachment due to spurring at C4-5, right greater than left.  Mild foraminal encroachment bilaterally at C5- 6.  There is moderate foraminal narrowing on the right and C6-7.  Negative for fracture.  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  There is extensive cervical spondylosis and foraminal encroachment.  Negative for fracture.   Original Report Authenticated By: Janeece Riggers, M.D.    Dg Knee Complete 4 Views Right  08/06/2012  *RADIOLOGY REPORT*  Clinical Data: Pain post trauma  RIGHT KNEE - COMPLETE 4+ VIEW  Comparison: February 13, 2009  Findings: Frontal, lateral, bilateral oblique views were obtained. There is no fracture, dislocation, or effusion.  There is suprapatellar calcifications officially, a stable finding.  There is mild generalized joint space narrowing with spurring in all compartments, most pronounced laterally.  No erosive change.  IMPRESSION: Generalized osteoarthritic change.  No fracture or effusion.   Original Report Authenticated By: Bretta Bang, M.D.      1. Neck pain   2. Hip pain, acute, right   3. Right knee pain       MDM  Pt presenting with right neck, shoulder, hip, and knee pain after tripping over wheelchair.  Poor historian.   States he wants to get back to [place] in [place] as soon as he can.  Denies chest pain, sob, abdominal pain, or loss of bowel or bladder.  Neuro: CN II-XII  in tact, PERRL, neg romberg, pt has antalgic gait.   Discussed pt with Dr. Patria Mane.  Placed pt in c-collar.  Will obtain imaging.   Imaging: no fx or acute abnormalities.  No intracranial bleed.  Will discharge pt home.  Pt states he will be going back to [place] in [place] because there is where all of his stuff is.  Pt may f/u with them for medication refills.  Pt verbalizes understanding and agreement of plan.   Vitals: unremarkable. Discharged in stable condition.    Discussed pt with attending during ED encounter.       Junius Finner, PA-C 08/06/12 1313

## 2012-08-06 NOTE — ED Provider Notes (Signed)
Medical screening examination/treatment/procedure(s) were conducted as a shared visit with non-physician practitioner(s) and myself.  I personally evaluated the patient during the encounter  Imaging without abnormalities.  Pain treated.  Discharge home in good condition.  Lyanne Co, MD 08/06/12 1352

## 2012-08-06 NOTE — ED Notes (Signed)
Patient was getting off the bus and he fell onto his right side.  Patient now c/o right hip and right shoulder pain.  Patient denies LOC, or hitting his head.

## 2012-11-12 ENCOUNTER — Emergency Department (HOSPITAL_COMMUNITY)
Admission: EM | Admit: 2012-11-12 | Discharge: 2012-11-12 | Disposition: A | Discharge status: Home / Self Care | Payer: Medicare Other | Attending: Emergency Medicine | Admitting: Emergency Medicine

## 2012-11-12 ENCOUNTER — Encounter (HOSPITAL_COMMUNITY): Payer: Self-pay | Admitting: Emergency Medicine

## 2012-11-12 DIAGNOSIS — Z7982 Long term (current) use of aspirin: Secondary | ICD-10-CM | POA: Insufficient documentation

## 2012-11-12 DIAGNOSIS — Z79899 Other long term (current) drug therapy: Secondary | ICD-10-CM | POA: Insufficient documentation

## 2012-11-12 DIAGNOSIS — E119 Type 2 diabetes mellitus without complications: Secondary | ICD-10-CM | POA: Insufficient documentation

## 2012-11-12 DIAGNOSIS — F1011 Alcohol abuse, in remission: Secondary | ICD-10-CM

## 2012-11-12 DIAGNOSIS — F3289 Other specified depressive episodes: Secondary | ICD-10-CM | POA: Insufficient documentation

## 2012-11-12 DIAGNOSIS — F17200 Nicotine dependence, unspecified, uncomplicated: Secondary | ICD-10-CM | POA: Insufficient documentation

## 2012-11-12 DIAGNOSIS — E871 Hypo-osmolality and hyponatremia: Secondary | ICD-10-CM

## 2012-11-12 DIAGNOSIS — F1010 Alcohol abuse, uncomplicated: Secondary | ICD-10-CM

## 2012-11-12 DIAGNOSIS — Z5900 Homelessness unspecified: Secondary | ICD-10-CM

## 2012-11-12 DIAGNOSIS — F411 Generalized anxiety disorder: Secondary | ICD-10-CM | POA: Insufficient documentation

## 2012-11-12 DIAGNOSIS — Z046 Encounter for general psychiatric examination, requested by authority: Secondary | ICD-10-CM | POA: Insufficient documentation

## 2012-11-12 DIAGNOSIS — I10 Essential (primary) hypertension: Secondary | ICD-10-CM | POA: Insufficient documentation

## 2012-11-12 DIAGNOSIS — Z8669 Personal history of other diseases of the nervous system and sense organs: Secondary | ICD-10-CM | POA: Insufficient documentation

## 2012-11-12 DIAGNOSIS — F10229 Alcohol dependence with intoxication, unspecified: Secondary | ICD-10-CM | POA: Insufficient documentation

## 2012-11-12 DIAGNOSIS — F329 Major depressive disorder, single episode, unspecified: Secondary | ICD-10-CM | POA: Insufficient documentation

## 2012-11-12 DIAGNOSIS — F1020 Alcohol dependence, uncomplicated: Secondary | ICD-10-CM

## 2012-11-12 DIAGNOSIS — F489 Nonpsychotic mental disorder, unspecified: Secondary | ICD-10-CM | POA: Insufficient documentation

## 2012-11-12 LAB — COMPREHENSIVE METABOLIC PANEL
BUN: 7 mg/dL (ref 6–23)
CO2: 20 mEq/L (ref 19–32)
Chloride: 92 mEq/L — ABNORMAL LOW (ref 96–112)
Creatinine, Ser: 0.6 mg/dL (ref 0.50–1.35)
GFR calc Af Amer: >90 mL/min (ref >90)
GFR calc non Af Amer: >90 mL/min (ref >90)
Total Bilirubin: 0.2 mg/dL — ABNORMAL LOW (ref 0.3–1.2)

## 2012-11-12 LAB — GLUCOSE, CAPILLARY

## 2012-11-12 LAB — CBC WITH DIFFERENTIAL/PLATELET
HCT: 38.2 % — ABNORMAL LOW (ref 39.0–52.0)
Hemoglobin: 12.5 g/dL — ABNORMAL LOW (ref 13.0–17.0)
Lymphocytes Relative: 27 % (ref 12–46)
MCHC: 32.7 g/dL (ref 30.0–36.0)
MCV: 78.3 fL (ref 78.0–100.0)
Monocytes Absolute: 0.4 10*3/uL (ref 0.1–1.0)
Monocytes Relative: 4 % (ref 3–12)
Neutro Abs: 6.6 10*3/uL (ref 1.7–7.7)

## 2012-11-12 LAB — ETHANOL: Alcohol, Ethyl (B): 194 mg/dL — ABNORMAL HIGH (ref 0–11)

## 2012-11-12 MED ORDER — SODIUM CHLORIDE 0.9 % IV BOLUS (SEPSIS)
1000.0000 mL | Freq: Once | INTRAVENOUS | Status: AC
  Administered 2012-11-12: 1000 mL via INTRAVENOUS

## 2012-11-12 NOTE — ED Notes (Signed)
[id number]<AV> Expected date:<BR> Expected time:<BR> Means of arrival:<BR> Comments:<BR> EMS, altered mental status

## 2012-11-12 NOTE — ED Provider Notes (Signed)
CSN: [id number]     Arrival date & time 11/12/12  1312 History     First MD Initiated Contact with Patient 11/12/12 1331     Chief Complaint  Patient presents with  . Medical Clearance    suicidal   (Consider location/radiation/quality/duration/timing/severity/associated sxs/prior Treatment) The history is provided by the patient. The history is limited by the condition of the patient.  pt w hx etoh abuse, behavioral disorder, who [facility] [place].  He states was given bus pass to come to [place], from there ended up in ED earlier this morning, and again now.  +etoh abuse. Pt cant quantify amt or when last drank.  Pt uncooperative w history - level 5 caveat.     Past Medical History  Diagnosis Date  . Diabetes mellitus   . Hypertension   . Neuropathy   . Mental disorder   . Depression   . Anxiety    No past surgical history on file. No family history on file. History  Substance Use Topics  . Smoking status: Current Every Day Smoker -- 2.00 packs/day    Types: Cigarettes  . Smokeless tobacco: Never Used  . Alcohol Use: 2.4 oz/week    4 Cans of beer per week    Review of Systems  Unable to perform ROS: Psychiatric disorder  level 5 caveat  Allergies  Nicoderm  Home Medications   Current Outpatient Rx  Name  Route  Sig  Dispense  Refill  . aspirin 81 MG tablet   Oral   Take 81 mg by mouth daily.         . fish oil-omega-3 fatty acids 1000 MG capsule   Oral   Take 2 g by mouth daily.         Marland Kitchen glipiZIDE (GLUCOTROL) 10 MG tablet   Oral   Take 10 mg by mouth 2 (two) times daily before a meal.         . hydroxypropyl methylcellulose (ISOPTO TEARS) 2.5 % ophthalmic solution   Both Eyes   Place 1 drop into both eyes 4 (four) times daily as needed (for dry eyes).         . metFORMIN (GLUCOPHAGE) 500 MG tablet   Oral   Take 500 mg by mouth 2 (two) times daily with a meal.         . Oxycodone  HCl 10 MG TABS   Oral   Take 10 mg by mouth 4 (four) times daily as needed (for pain).         . traZODone (DESYREL) 100 MG tablet   Oral   Take 100 mg by mouth at bedtime.         . trolamine salicylate (ASPERCREME) 10 % cream   Topical   Apply 1 application topically as needed (to arms and legs).          BP 105/61  Pulse 84  Temp(Src) 98.2 F (36.8 C) (Oral)  Resp 16  SpO2 92% Physical Exam  Nursing note and vitals reviewed. Constitutional: He appears well-developed and well-nourished. No distress.  HENT:  Head: Atraumatic.  Eyes: Conjunctivae are normal. Pupils are equal, round, and reactive to light.  Neck: Neck supple. No tracheal deviation present.  Cardiovascular: Normal rate.   Pulmonary/Chest: Effort normal. No accessory muscle usage. No respiratory distress.  Abdominal: He exhibits no distension. There is no tenderness.  Musculoskeletal: Normal range of motion. He exhibits no edema.  Neurological: He  is alert.  Uncooperative ?intoxicated. Moves bil ext purposefully.   Skin: Skin is warm and dry.  Psychiatric:  Pt uncooperative w exam.     ED Course   Procedures (including critical care time)  Results for orders placed during the hospital encounter of 11/12/12  CBC WITH DIFFERENTIAL      Result Value Range   WBC 9.8  4.0 - 10.5 K/uL   RBC 4.88  4.22 - 5.81 MIL/uL   Hemoglobin 12.5 (*) 13.0 - 17.0 g/dL   HCT 16.1 (*) 09.6 - 04.5 %   MCV 78.3  78.0 - 100.0 fL   MCH 25.6 (*) 26.0 - 34.0 pg   MCHC 32.7  30.0 - 36.0 g/dL   RDW 40.9 (*) 81.1 - 91.4 %   Platelets 346  150 - 400 K/uL   Neutrophils Relative % 68  43 - 77 %   Neutro Abs 6.6  1.7 - 7.7 K/uL   Lymphocytes Relative 27  12 - 46 %   Lymphs Abs 2.6  0.7 - 4.0 K/uL   Monocytes Relative 4  3 - 12 %   Monocytes Absolute 0.4  0.1 - 1.0 K/uL   Eosinophils Relative 1  0 - 5 %   Eosinophils Absolute 0.1  0.0 - 0.7 K/uL   Basophils Relative 0  0 - 1 %   Basophils Absolute 0.0  0.0 - 0.1 K/uL   COMPREHENSIVE METABOLIC PANEL      Result Value Range   Sodium 126 (*) 135 - 145 mEq/L   Potassium 4.0  3.5 - 5.1 mEq/L   Chloride 92 (*) 96 - 112 mEq/L   CO2 20  19 - 32 mEq/L   Glucose, Bld 141 (*) 70 - 99 mg/dL   BUN 7  6 - 23 mg/dL   Creatinine, Ser 7.82  0.50 - 1.35 mg/dL   Calcium 9.1  8.4 - 95.6 mg/dL   Total Protein 7.2  6.0 - 8.3 g/dL   Albumin 3.7  3.5 - 5.2 g/dL   AST 20  0 - 37 U/L   ALT 23  0 - 53 U/L   Alkaline Phosphatase 56  39 - 117 U/L   Total Bilirubin 0.2 (*) 0.3 - 1.2 mg/dL   GFR calc non Af Amer >90  >90 mL/min   GFR calc Af Amer >90  >90 mL/min  ETHANOL      Result Value Range   Alcohol, Ethyl (B) 194 (*) 0 - 11 mg/dL       MDM  Labs. sw consult.   Reviewed nursing notes and prior charts for additional history.   Na 126 - prior na in range 126-129.  Pt notes recent poor po intake. abd soft nt. No nvd.  Iv ns bolus.  Act has evaluated pt - pt denies any thoughts of self harm/SI. Pt denies feeling acutely depressed.    sw asked to see - they recommend d/c from ed, stating pt can go to shelter.  Pt previously has left prior [facility] when placed there, and indicates does not want to go to [facility].  Also has been declined at [facility] as was smoking there, disagreement w staff.  Will plan to d/c, can go to shelter.   Po fluids/meal.     Suzi Roots, MD 11/12/12 [phone]

## 2012-11-12 NOTE — ED Notes (Signed)
Pt given address and phone number of [facility]. Pt also instructed to call [facility] as recommended by Child psychotherapist. Pt verbalized understanding of d/c instructions and follow-up care.

## 2012-11-12 NOTE — ED Notes (Signed)
Pt states he cannot control his bladder and is unable to use a urinal.  When told that we would have to use a catheter, pt states that "you not touching my dick".  RN notified

## 2012-11-12 NOTE — ED Notes (Addendum)
Attempted lab draw, pt. Refused. Pt. States, "ain't no one going to draw his damn blood." CN made aware.

## 2012-11-12 NOTE — ED Provider Notes (Signed)
CSN: [id number]     Arrival date & time 11/12/12  0025 History     First MD Initiated Contact with Patient 11/12/12 0158     Chief Complaint  Patient presents with  . Medical Clearance   (Consider location/radiation/quality/duration/timing/severity/associated sxs/prior Treatment) HPI History provided by patient and [facility]. Patient got off of the [employer] bus tonight from [place]. Reportedly intoxicated and police were involved at the bus station. Patient was brought here for history of mental disorder in [facility] Department requesting psychiatric evaluation of the patient. Patient denies any suicidal or homicidal ideation. He denies any hallucinations. He is verbally abusive and admits to history of alcohol use. He declines speaking to a psychiatrist when offered. No fall or head injury. No trauma.   Past Medical History  Diagnosis Date  . Diabetes mellitus   . Hypertension   . Neuropathy   . Mental disorder   . Depression   . Anxiety    History reviewed. No pertinent past surgical history. No family history on file. History  Substance Use Topics  . Smoking status: Current Every Day Smoker -- 2.00 packs/day    Types: Cigarettes  . Smokeless tobacco: Never Used  . Alcohol Use: 2.4 oz/week    4 Cans of beer per week    Review of Systems  Constitutional: Negative for fever and chills.  HENT: Negative for neck pain and neck stiffness.   Eyes: Negative for visual disturbance.  Respiratory: Negative for shortness of breath.   Cardiovascular: Negative for chest pain.  Gastrointestinal: Negative for abdominal pain.  Genitourinary: Negative for flank pain.  Musculoskeletal: Negative for back pain.  Skin: Negative for rash.  Neurological: Negative for seizures and headaches.  All other systems reviewed and are negative.    Allergies  Nicoderm  Home Medications   Current Outpatient Rx  Name  Route  Sig  Dispense  Refill  . aspirin 81 MG  tablet   Oral   Take 81 mg by mouth daily.         . fish oil-omega-3 fatty acids 1000 MG capsule   Oral   Take 2 g by mouth daily.         Marland Kitchen glipiZIDE (GLUCOTROL) 10 MG tablet   Oral   Take 10 mg by mouth 2 (two) times daily before a meal.         . hydroxypropyl methylcellulose (ISOPTO TEARS) 2.5 % ophthalmic solution   Both Eyes   Place 1 drop into both eyes 4 (four) times daily as needed (for dry eyes).         . metFORMIN (GLUCOPHAGE) 500 MG tablet   Oral   Take 500 mg by mouth 2 (two) times daily with a meal.         . Oxycodone HCl 10 MG TABS   Oral   Take 10 mg by mouth 4 (four) times daily as needed (for pain).         . traZODone (DESYREL) 100 MG tablet   Oral   Take 100 mg by mouth at bedtime.         . trolamine salicylate (ASPERCREME) 10 % cream   Topical   Apply 1 application topically as needed (to arms and legs).          BP 99/66  Pulse 73  Temp(Src) 97.6 F (36.4 C) (Oral)  Resp 20  SpO2 97% Physical Exam  Constitutional: He is oriented to person, place, and time. He appears  well-developed and well-nourished.  HENT:  Head: Normocephalic and atraumatic.  Eyes: EOM are normal. Pupils are equal, round, and reactive to light.  Neck: Neck supple.  Cardiovascular: Normal rate, regular rhythm and intact distal pulses.   Pulmonary/Chest: Effort normal and breath sounds normal. No respiratory distress.  Abdominal: Soft. He exhibits no distension. There is no tenderness.  Musculoskeletal: Normal range of motion. He exhibits no edema.  Neurological: He is alert and oriented to person, place, and time.  Skin: Skin is warm and dry.    ED Course   Procedures (including critical care time)  Results for orders placed during the hospital encounter of 11/12/12  GLUCOSE, CAPILLARY      Result Value Range   Glucose-Capillary 159 (*) 70 - 99 mg/dL   Comment 1 Notify RN     Comment 2 Documented in Chart     Patient refuses blood draw. He is  requesting something to eat, something to drink and warm blankets and rest.    Reviews records reviewed, as well as the emergency department with history of alcohol abuse.  No indication for workup or admission at this time  [employer] notified and patient is not under arrest. He was discharged home with alcohol precautions. He states understanding all discharge and followup instructions.   MDM  Alcohol intoxication  No psychosis, suicidal or homicidal ideation - no indication for psychiatric evaluation at this time  Vital signs, nurse's notes and previous records reviewed and considered  Sunnie Nielsen, MD 11/13/12 [phone]

## 2012-11-12 NOTE — ED Notes (Signed)
[facility], [employer] is requesting receive a psych evaluation. Pt is not in [employer] custody and will not be going with [employer].

## 2012-11-12 NOTE — ED Notes (Signed)
Pt is wheelchair bound and cannot ambulate.

## 2012-11-12 NOTE — ED Notes (Signed)
Per EMS- Patient was seen in the ED earlier today. [place]. Patient is wheelchair bound and has no use of his lower extremities. Patient smells of alcohol and states he is suicidal. Patient denies HI.

## 2012-11-12 NOTE — ED Notes (Signed)
Pt arrived off EMS stretched fully soiled in urine. Changed pt into hospital gown and put a brief on him.

## 2012-11-12 NOTE — Progress Notes (Addendum)
CSW received referral from EDP regarding patient needs. Per physican patient is unclear to understand at this time. Pt labs pending, etoh present. CSW will assess appropriate.   Catha Gosselin, LCSWA  [phone] .11/12/2012 1416pm   CSW met with pt at bedside. Pt denies SI/HI/AH/VH. Patient reports someone was pushing his wheelchair to try to help him, and he felt out of the wheelchair on to the rail road tracks and the person called for help. Patient reports he is in a lot of pain. Patient denies having a plan of hurting himself. Pt states he was not trying to end his life when he was at the rail road tracks. Patient reports he tried to go to the [facility] however stated he couldn't stay. [facility] with [facility] and confirmed that patient has been banned from striking a staff member and lighting a cigarette in the lobby.  Patient stated he was interested into looking into [facility]. CSW provided patient with information.   Catha Gosselin, LCSWA  [phone] .11/12/2012 1451pm

## 2012-11-12 NOTE — ED Notes (Signed)
Pt refusing something to drink. Pt is also refusing all lab draws. Pt is incontinent and refusing to allow RN or NT to help with a urinal or in & out cath for urine sample.

## 2012-11-13 ENCOUNTER — Emergency Department (HOSPITAL_COMMUNITY): Payer: Medicare Other

## 2012-11-13 ENCOUNTER — Emergency Department (EMERGENCY_DEPARTMENT_HOSPITAL)
Admission: EM | Admit: 2012-11-13 | Discharge: 2012-11-16 | Disposition: A | Discharge status: Other Acute Inpatient Hospital | Payer: Medicare Other | Source: Home / Self Care | Attending: Emergency Medicine | Admitting: Emergency Medicine

## 2012-11-13 ENCOUNTER — Encounter (HOSPITAL_COMMUNITY): Payer: Self-pay | Admitting: Emergency Medicine

## 2012-11-13 DIAGNOSIS — F339 Major depressive disorder, recurrent, unspecified: Secondary | ICD-10-CM | POA: Insufficient documentation

## 2012-11-13 DIAGNOSIS — Z5900 Homelessness unspecified: Secondary | ICD-10-CM | POA: Insufficient documentation

## 2012-11-13 DIAGNOSIS — Z79899 Other long term (current) drug therapy: Secondary | ICD-10-CM | POA: Insufficient documentation

## 2012-11-13 DIAGNOSIS — F411 Generalized anxiety disorder: Secondary | ICD-10-CM | POA: Insufficient documentation

## 2012-11-13 DIAGNOSIS — E119 Type 2 diabetes mellitus without complications: Secondary | ICD-10-CM | POA: Insufficient documentation

## 2012-11-13 DIAGNOSIS — I10 Essential (primary) hypertension: Secondary | ICD-10-CM | POA: Insufficient documentation

## 2012-11-13 DIAGNOSIS — F1020 Alcohol dependence, uncomplicated: Secondary | ICD-10-CM | POA: Insufficient documentation

## 2012-11-13 DIAGNOSIS — Z8669 Personal history of other diseases of the nervous system and sense organs: Secondary | ICD-10-CM | POA: Insufficient documentation

## 2012-11-13 DIAGNOSIS — Z7982 Long term (current) use of aspirin: Secondary | ICD-10-CM | POA: Insufficient documentation

## 2012-11-13 DIAGNOSIS — Z8659 Personal history of other mental and behavioral disorders: Secondary | ICD-10-CM | POA: Insufficient documentation

## 2012-11-13 DIAGNOSIS — F17200 Nicotine dependence, unspecified, uncomplicated: Secondary | ICD-10-CM | POA: Insufficient documentation

## 2012-11-13 LAB — POCT I-STAT, CHEM 8
Calcium, Ion: 1.08 mmol/L — ABNORMAL LOW (ref 1.13–1.30)
Chloride: 94 mEq/L — ABNORMAL LOW (ref 96–112)
Glucose, Bld: 131 mg/dL — ABNORMAL HIGH (ref 70–99)
HCT: 45 % (ref 39.0–52.0)
Hemoglobin: 15.3 g/dL (ref 13.0–17.0)

## 2012-11-13 LAB — URINALYSIS, ROUTINE W REFLEX MICROSCOPIC
Ketones, ur: NEGATIVE mg/dL
Leukocytes, UA: NEGATIVE
Nitrite: NEGATIVE
Protein, ur: NEGATIVE mg/dL

## 2012-11-13 LAB — CBC WITH DIFFERENTIAL/PLATELET
Basophils Absolute: 0 10*3/uL (ref 0.0–0.1)
Basophils Relative: 0 % (ref 0–1)
Eosinophils Absolute: 0.2 10*3/uL (ref 0.0–0.7)
HCT: 39.7 % (ref 39.0–52.0)
MCHC: 32.7 g/dL (ref 30.0–36.0)
Monocytes Absolute: 0.8 10*3/uL (ref 0.1–1.0)
Neutro Abs: 7.6 10*3/uL (ref 1.7–7.7)
RDW: 16.8 % — ABNORMAL HIGH (ref 11.5–15.5)

## 2012-11-13 MED ORDER — IOHEXOL 300 MG/ML  SOLN
50.0000 mL | Freq: Once | INTRAMUSCULAR | Status: AC | PRN
  Administered 2012-11-13: 50 mL via ORAL

## 2012-11-13 MED ORDER — TRAZODONE HCL 100 MG PO TABS
100.0000 mg | ORAL_TABLET | Freq: Every day | ORAL | Status: DC
  Administered 2012-11-14 (×2): 100 mg via ORAL
  Filled 2012-11-13 (×3): qty 1

## 2012-11-13 MED ORDER — GLIPIZIDE 10 MG PO TABS
10.0000 mg | ORAL_TABLET | Freq: Two times a day (BID) | ORAL | Status: DC
  Administered 2012-11-14 – 2012-11-16 (×5): 10 mg via ORAL
  Filled 2012-11-13 (×7): qty 1

## 2012-11-13 MED ORDER — ASPIRIN EC 81 MG PO TBEC
81.0000 mg | DELAYED_RELEASE_TABLET | Freq: Every day | ORAL | Status: DC
  Administered 2012-11-14 – 2012-11-16 (×3): 81 mg via ORAL
  Filled 2012-11-13 (×3): qty 1

## 2012-11-13 MED ORDER — IOHEXOL 300 MG/ML  SOLN: 50.0000 mL | Freq: Once | INTRAMUSCULAR | Status: AC | PRN

## 2012-11-13 MED ORDER — METFORMIN HCL 500 MG PO TABS
500.0000 mg | ORAL_TABLET | Freq: Two times a day (BID) | ORAL | Status: DC
  Administered 2012-11-14 – 2012-11-16 (×5): 500 mg via ORAL
  Filled 2012-11-13 (×7): qty 1

## 2012-11-13 MED ORDER — ASPIRIN 81 MG PO TABS: 81.0000 mg | ORAL_TABLET | Freq: Every day | ORAL | Status: DC

## 2012-11-13 NOTE — ED Notes (Signed)
Per EMS, was [place] gas station-owner called EMS to transport pt to ED for SI and PTSD-pt has been drinking
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
## 2012-11-13 NOTE — ED Notes (Signed)
Pt made aware of need for a urine sample.  Pt states that he is unable to provide one at this moment.

## 2012-11-13 NOTE — ED Notes (Signed)
When pt arrived to rm 16, pt clothes were heavily soiled with urine.  Pt was changed hospital gown and a brief placed on pt.  Pt states that he can not control his bladder so is unsure if he can give urine sample.  Pt. Clothing was placed in personal belongings bag, his wheelchair and other belongings are at bedside.

## 2012-11-13 NOTE — ED Provider Notes (Addendum)
CSN: [id number]     Arrival date & time 11/13/12  1624 History  This chart was scribed for Gwyneth Sprout, MD by Greggory Stallion, ED Scribe. This patient was seen in room WTR2/WLPT2 and the patient's care was started at 4:40 PM.   Chief Complaint  Patient presents with  . Medical Clearance   The history is provided by the patient. No language interpreter was used.    HPI Comments: Ernest [name] is a 72 y.o. male who presents to the Emergency Department complaining of depression. Pt denies he has SI. He states he was trying to get to the [place] in [place] today. Pt states he has had three beers today. He states no one would let him come into their store to urinate so he urinated on himself. Pt states he was here yesterday and left and spent the night outside.   Past Medical History  Diagnosis Date  . Diabetes mellitus   . Hypertension   . Neuropathy   . Mental disorder   . Depression   . Anxiety    History reviewed. No pertinent past surgical history. No family history on file. History  Substance Use Topics  . Smoking status: Current Every Day Smoker -- 2.00 packs/day    Types: Cigarettes  . Smokeless tobacco: Never Used  . Alcohol Use: 2.4 oz/week    4 Cans of beer per week    Review of Systems  A complete 10 system review of systems was obtained and all systems are negative except as noted in the HPI and PMH.   Allergies  Nicoderm  Home Medications   Current Outpatient Rx  Name  Route  Sig  Dispense  Refill  . aspirin 81 MG tablet   Oral   Take 81 mg by mouth daily.         . fish oil-omega-3 fatty acids 1000 MG capsule   Oral   Take 2 g by mouth daily.         Marland Kitchen glipiZIDE (GLUCOTROL) 10 MG tablet   Oral   Take 10 mg by mouth 2 (two) times daily before a meal.         . hydroxypropyl methylcellulose (ISOPTO TEARS) 2.5 % ophthalmic solution   Both Eyes   Place 1 drop into both eyes 4 (four) times daily as needed (for dry eyes).         .  metFORMIN (GLUCOPHAGE) 500 MG tablet   Oral   Take 500 mg by mouth 2 (two) times daily with a meal.         . Oxycodone HCl 10 MG TABS   Oral   Take 10 mg by mouth 4 (four) times daily as needed (for pain).         . traZODone (DESYREL) 100 MG tablet   Oral   Take 100 mg by mouth at bedtime.         . trolamine salicylate (ASPERCREME) 10 % cream   Topical   Apply 1 application topically as needed (to arms and legs).          BP 127/76  Pulse 76  Temp(Src) 97.6 F (36.4 C) (Oral)  Resp 18  SpO2 99%  Physical Exam  Nursing note and vitals reviewed. Constitutional: He is oriented to person, place, and time. He appears well-developed and well-nourished. No distress.  Smells of urine and alcohol. Disheveled.   HENT:  Head: Normocephalic and atraumatic.  Eyes: EOM are normal.  Neck: Normal range  of motion. Neck supple. No tracheal deviation present.  Cardiovascular: Normal rate, regular rhythm and normal heart sounds.  Exam reveals no gallop and no friction rub.   No murmur heard. Pulmonary/Chest: Effort normal. No respiratory distress. He has no wheezes. He has no rales.  Coarse upper airway sounds.   Abdominal: Soft. There is no tenderness.  Musculoskeletal: Normal range of motion.  Neurological: He is alert and oriented to person, place, and time.  Skin: Skin is warm and dry.  Psychiatric:  Depressed and flat affect.    ED Course   Procedures (including critical care time)  DIAGNOSTIC STUDIES: Oxygen Saturation is 99% on RA, normal by my interpretation.    COORDINATION OF CARE: 4:47 PM-Discussed treatment plan which includes talking with psych with pt at bedside and pt agreed to plan.   Labs Reviewed  ETHANOL - Abnormal; Notable for the following:    Alcohol, Ethyl (B) 112 (*)    All other components within normal limits  POCT I-STAT, CHEM 8 - Abnormal; Notable for the following:    Sodium 127 (*)    Chloride 94 (*)    BUN 5 (*)    Glucose, Bld 131  (*)    Calcium, Ion 1.08 (*)    All other components within normal limits  URINALYSIS, ROUTINE W REFLEX MICROSCOPIC   Dg Chest 2 View  11/13/2012   *RADIOLOGY REPORT*  Clinical Data: Coarse breath sounds physical examination. Generalized weakness.  Smoker with current history of diabetes and hypertension.  CHEST - 2 VIEW  Comparison: Two-view chest x-ray 05/09/2010, 02/19/2010.  Findings: Pneumoperitoneum, with an air-fluid level beneath the right hemidiaphragm, indicating associated ascites in the abdomen. This accounts for mild elevation of the right hemidiaphragm.  Cardiac silhouette mildly enlarged but stable.  Thoracic aorta tortuous and mildly atherosclerotic, unchanged.  Hilar and mediastinal contours otherwise unremarkable.  Mild atelectasis in the lower lobes and right middle lobe.  Lungs otherwise clear. Pulmonary vascularity normal without evidence of pulmonary edema. Degenerative changes involving the thoracic spine.  IMPRESSION:  1.  Pneumoperitoneum, with an air-fluid level in the right upper quadrant of the abdomen (consistent with associated ascites). 2.  Atelectasis in the lower lobes and right middle lobe.  No acute cardiopulmonary disease otherwise. 3.  Stable mild cardiomegaly without pulmonary edema.  Critical Value/emergent results were called by telephone at the time of interpretation on 11/13/2012 at 1759 hours to Dr. Anitra Lauth of the emergency department, who verbally acknowledged these results.   Original Report Authenticated By: Hulan Saas, M.D.   No diagnosis found.  MDM   Patient is an elderly male who is in here now 3 times in the last 36 hours. he apparently was at the [facility] for psychiatric issues and discharged 2 days ago and given a bus pass to [place]. Patient states he does not know anybody in [place] and feels he needs to go back to the [place].  Patient is complaining of being depressed and states that he strength 3 beers this morning slept outside and  could not stay at the homeless shelter. He has urinated on himself but denies any localized pain, falls, head injury or other significant findings. Looking through labs patient has a chronic hyponatremia will recheck sodium and alcohol level today and a CBC done in the last 24 hours is normal. Also will check a UA as patient does note still urine to ensure no infection if that was not done yesterday.  However due to coarse upper airway sounds  will do CXR.  6:04 PM CXR positive for possible free air below the right diaphragm.  Pt is no have particular abd pain however given finding will get CT to further eval.  7:59 PM CT neg for acute findings.  Pt medically clear.  This is now his 3rd visit in 2 days.  C/o of depression and homeless.  Also seems to have mild dementia.  Will see if he qualifies ot go back to [place] at [place].  12:12 AM Social work saw the pt and will start looking for assisted living.  Pt to here c/o of depression and vague SI.  Will get telepsych.  I personally performed the services described in this documentation, which was scribed in my presence.  The recorded information has been reviewed and considered.   Gwyneth Sprout, MD 11/13/12 2000  Gwyneth Sprout, MD 11/14/12 1610

## 2012-11-13 NOTE — ED Notes (Signed)
Patient transported to CT 

## 2012-11-14 DIAGNOSIS — F4310 Post-traumatic stress disorder, unspecified: Secondary | ICD-10-CM

## 2012-11-14 DIAGNOSIS — F1020 Alcohol dependence, uncomplicated: Secondary | ICD-10-CM

## 2012-11-14 MED ORDER — HALOPERIDOL 5 MG PO TABS: 5.0000 mg | ORAL_TABLET | Freq: Four times a day (QID) | ORAL | Status: DC | PRN

## 2012-11-14 MED ORDER — LORAZEPAM 2 MG/ML IJ SOLN: 2.0000 mg | Freq: Four times a day (QID) | INTRAMUSCULAR | Status: DC | PRN

## 2012-11-14 MED ORDER — HALOPERIDOL LACTATE 5 MG/ML IJ SOLN: 5.0000 mg | Freq: Four times a day (QID) | INTRAMUSCULAR | Status: DC | PRN

## 2012-11-14 MED ORDER — ZOLPIDEM TARTRATE 5 MG PO TABS: 5.0000 mg | ORAL_TABLET | Freq: Every evening | ORAL | Status: DC | PRN

## 2012-11-14 MED ORDER — ADULT MULTIVITAMIN W/MINERALS CH
1.0000 | ORAL_TABLET | Freq: Every day | ORAL | Status: DC
  Administered 2012-11-14 – 2012-11-16 (×3): 1 via ORAL
  Filled 2012-11-14 (×3): qty 1

## 2012-11-14 MED ORDER — ACETAMINOPHEN 325 MG PO TABS: 650.0000 mg | ORAL_TABLET | ORAL | Status: DC | PRN

## 2012-11-14 MED ORDER — FOLIC ACID 1 MG PO TABS
1.0000 mg | ORAL_TABLET | Freq: Every day | ORAL | Status: DC
  Administered 2012-11-14 – 2012-11-16 (×3): 1 mg via ORAL
  Filled 2012-11-14 (×3): qty 1

## 2012-11-14 MED ORDER — ALUM & MAG HYDROXIDE-SIMETH 200-200-20 MG/5ML PO SUSP: 30.0000 mL | ORAL | Status: DC | PRN

## 2012-11-14 MED ORDER — IBUPROFEN 200 MG PO TABS
600.0000 mg | ORAL_TABLET | Freq: Three times a day (TID) | ORAL | Status: DC | PRN
  Administered 2012-11-14 – 2012-11-16 (×3): 600 mg via ORAL
  Filled 2012-11-14 (×4): qty 3

## 2012-11-14 MED ORDER — THIAMINE HCL 100 MG/ML IJ SOLN: 100.0000 mg | Freq: Every day | INTRAMUSCULAR | Status: DC

## 2012-11-14 MED ORDER — VITAMIN B-1 100 MG PO TABS
100.0000 mg | ORAL_TABLET | Freq: Every day | ORAL | Status: DC
  Administered 2012-11-14 – 2012-11-16 (×3): 100 mg via ORAL
  Filled 2012-11-14 (×3): qty 1

## 2012-11-14 MED ORDER — LORAZEPAM 1 MG PO TABS: 2.0000 mg | ORAL_TABLET | Freq: Four times a day (QID) | ORAL | Status: DC | PRN

## 2012-11-14 MED ORDER — ONDANSETRON HCL 4 MG PO TABS: 4.0000 mg | ORAL_TABLET | Freq: Three times a day (TID) | ORAL | Status: DC | PRN

## 2012-11-14 MED ORDER — NICOTINE 21 MG/24HR TD PT24: 21.0000 mg | MEDICATED_PATCH | Freq: Every day | TRANSDERMAL | Status: DC | PRN

## 2012-11-14 NOTE — Consult Note (Signed)
RI have reviewed the patient's current medications.eason for Consult: Alcohol intoxication and suicidal thoughts Referring Physician: Dr. Drue Dun Ernest [name] is an 72 y.o. male.  HPI: The patient is a 72 years old man who came to the emergency room with suicidal thinking.  Patient is intoxicated with alcohol.  His alcohol level was 211.  [facility] [place]  He was recently released from there.  He claimed he was in assisted living facility however recently he reported that he was involved and currently he is homeless.  The patient continued to endorse suicidal thoughts and plan to kill himself.  He reported hopeless and helpless.  He cannot contract for safety.  He appears visibly irritable angry.  He  Mental status examination Patient is lying on his bed.  He appears older than his stated age.  His speech is fast and rambling.  He endorses positive suicidal thinking and plan to kill himself.  He has some tremors and shakes.  He denies any homicidal thoughts, paranoia or delusion.  He admitted some time hallucinations however currently he denies any auditory or visual hallucination.  His attention and concentration is poor.  His fund of knowledge is average.  He described his mood as depressed and sad and his affect is mood appropriate.  He is alert and oriented x3.  His insight judgment and impulse control is poor.  Past Medical History  Diagnosis Date  . Diabetes mellitus   . Hypertension   . Neuropathy   . Mental disorder   . Depression   . Anxiety     History reviewed. No pertinent past surgical history.  No family history on file.  Social History:  reports that he has been smoking Cigarettes.  He has been smoking about 2.00 packs per day. He has never used smokeless tobacco. He reports that he drinks about 2.4 ounces of alcohol per week. He reports that he does not use illicit drugs.  Allergies:  Allergies  Allergen Reactions  . Nicoderm  (Nicotine) Rash    Uses losenges with out any problem    Medications: I have reviewed the patient's current medications.  Results for orders placed during the hospital encounter of 11/13/12 (from the past 48 hour(s))  ETHANOL     Status: Abnormal   Collection Time    11/13/12  5:02 PM      Result Value Range   Alcohol, Ethyl (B) 112 (*) 0 - 11 mg/dL   Comment:            LOWEST DETECTABLE LIMIT FOR     SERUM ALCOHOL IS 11 mg/dL     FOR MEDICAL PURPOSES ONLY  POCT I-STAT, CHEM 8     Status: Abnormal   Collection Time    11/13/12  5:16 PM      Result Value Range   Sodium 127 (*) 135 - 145 mEq/L   Potassium 3.9  3.5 - 5.1 mEq/L   Chloride 94 (*) 96 - 112 mEq/L   BUN 5 (*) 6 - 23 mg/dL   Creatinine, Ser 1.19  0.50 - 1.35 mg/dL   Glucose, Bld 147 (*) 70 - 99 mg/dL   Calcium, Ion 8.29 (*) 1.13 - 1.30 mmol/L   TCO2 18  0 - 100 mmol/L   Hemoglobin 15.3  13.0 - 17.0 g/dL   HCT 56.2  13.0 - 86.5 %  CBC WITH DIFFERENTIAL     Status: Abnormal   Collection Time  11/13/12  6:10 PM      Result Value Range   WBC 11.9 (*) 4.0 - 10.5 K/uL   RBC 5.07  4.22 - 5.81 MIL/uL   Hemoglobin 13.0  13.0 - 17.0 g/dL   HCT 52.8  41.3 - 24.4 %   MCV 78.3  78.0 - 100.0 fL   MCH 25.6 (*) 26.0 - 34.0 pg   MCHC 32.7  30.0 - 36.0 g/dL   RDW 01.0 (*) 27.2 - 53.6 %   Platelets 325  150 - 400 K/uL   Neutrophils Relative % 64  43 - 77 %   Neutro Abs 7.6  1.7 - 7.7 K/uL   Lymphocytes Relative 28  12 - 46 %   Lymphs Abs 3.3  0.7 - 4.0 K/uL   Monocytes Relative 7  3 - 12 %   Monocytes Absolute 0.8  0.1 - 1.0 K/uL   Eosinophils Relative 1  0 - 5 %   Eosinophils Absolute 0.2  0.0 - 0.7 K/uL   Basophils Relative 0  0 - 1 %   Basophils Absolute 0.0  0.0 - 0.1 K/uL  URINALYSIS, ROUTINE W REFLEX MICROSCOPIC     Status: Abnormal   Collection Time    11/13/12  8:37 PM      Result Value Range   Color, Urine YELLOW  YELLOW   APPearance CLEAR  CLEAR   Specific Gravity, Urine 1.014  1.005 - 1.030   pH 5.0  5.0  - 8.0   Glucose, UA 500 (*) NEGATIVE mg/dL   Hgb urine dipstick NEGATIVE  NEGATIVE   Bilirubin Urine NEGATIVE  NEGATIVE   Ketones, ur NEGATIVE  NEGATIVE mg/dL   Protein, ur NEGATIVE  NEGATIVE mg/dL   Urobilinogen, UA 0.2  0.0 - 1.0 mg/dL   Nitrite NEGATIVE  NEGATIVE   Leukocytes, UA NEGATIVE  NEGATIVE   Comment: MICROSCOPIC NOT DONE ON URINES WITH NEGATIVE PROTEIN, BLOOD, LEUKOCYTES, NITRITE, OR GLUCOSE <1000 mg/dL.    Ct Abdomen Pelvis Wo Contrast  11/13/2012   *RADIOLOGY REPORT*  Clinical Data: Suspected pneumoperitoneum on chest radiograph  CT ABDOMEN AND PELVIS WITHOUT CONTRAST  Technique:  Multidetector CT imaging of the abdomen and pelvis was performed following the standard protocol without intravenous contrast.  Comparison: Chest radiographs dated 11/13/2012  Findings: Mild subpleural reticulation/dependent atelectasis at the right lung base.  Coronary atherosclerosis.  Mild hepatic steatosis.  Unenhanced spleen, pancreas, and adrenal glands are within normal limits.  Gallbladder is unremarkable.  No intrahepatic or extrahepatic ductal dilatation.  Kidneys are within normal limits.  No renal calculi or hydronephrosis.  No evidence of bowel obstruction.  Colonic interposition of a mobile cecum beneath the right hemidiaphragm (series 2/image 23), corresponding to the suspected radiographic abnormality.  Normal appendix.  No pneumoperitoneum.  Atherosclerotic calcifications of the abdominal aorta and branch vessels.  No abdominopelvic ascites.  No suspicious abdominopelvic lymphadenopathy.  Mild prostatomegaly, measuring 5.2 cm in transverse dimension, with enlargement of the central gland which indents the base of the bladder.  No ureteral or bladder calculi.  Bladder is within normal limits.  Mild degenerative changes of the visualized thoracolumbar spine, most prominent at L5-S1.  IMPRESSION: No evidence of pneumoperitoneum.  Mobile cecum interposed beneath the right hemidiaphragm, corresponding  to the suspected radiographic abnormality.  Mild hepatic steatosis.   Original Report Authenticated By: Charline Bills, M.D.   Dg Chest 2 View  11/13/2012   *RADIOLOGY REPORT*  Clinical Data: Coarse breath sounds physical examination. Generalized weakness.  Smoker  with current history of diabetes and hypertension.  CHEST - 2 VIEW  Comparison: Two-view chest x-ray 05/09/2010, 02/19/2010.  Findings: Pneumoperitoneum, with an air-fluid level beneath the right hemidiaphragm, indicating associated ascites in the abdomen. This accounts for mild elevation of the right hemidiaphragm.  Cardiac silhouette mildly enlarged but stable.  Thoracic aorta tortuous and mildly atherosclerotic, unchanged.  Hilar and mediastinal contours otherwise unremarkable.  Mild atelectasis in the lower lobes and right middle lobe.  Lungs otherwise clear. Pulmonary vascularity normal without evidence of pulmonary edema. Degenerative changes involving the thoracic spine.  IMPRESSION:  1.  Pneumoperitoneum, with an air-fluid level in the right upper quadrant of the abdomen (consistent with associated ascites). 2.  Atelectasis in the lower lobes and right middle lobe.  No acute cardiopulmonary disease otherwise. 3.  Stable mild cardiomegaly without pulmonary edema.  Critical Value/emergent results were called by telephone at the time of interpretation on 11/13/2012 at 1759 hours to Dr. Anitra Lauth of the emergency department, who verbally acknowledged these results.   Original Report Authenticated By: Hulan Saas, M.D.    Review of Systems  Psychiatric/Behavioral: Positive for depression, suicidal ideas and substance abuse. The patient is nervous/anxious and has insomnia.    Blood pressure 124/87, pulse 108, temperature 98.7 F (37.1 C), temperature source Oral, resp. rate 16, SpO2 97.00%. Physical Exam  Assessment/Plan: Axis I alcohol dependence, post traumatic stress disorder  Plan Patient does require inpatient psychiatric  treatment for his depression and suicidal thinking.  Patient requested [facility].  Recommend inpatient psychiatric treatment at [facility].   Fredrica Capano T. 11/14/2012, 12:40 PM

## 2012-11-14 NOTE — ED Notes (Signed)
Tele-pysch called

## 2012-11-14 NOTE — ED Provider Notes (Signed)
Ernest [name] is a 72 y.o. male who is being evaluated for suicide ideation, homelessness, chronic pain, and medical problems. He has been medically cleared. He has had frequent visits here recently. He has been seen by Dr. Jacky Kindle, from tele-psychiatry; who has diagnosed major depressive disorder, recurrent, moderate; and recommends that he be placed into a psychiatric facility. He has noted that there is a strong element of malingering with this presentation. However, based on his behavior and comments that were made, feels it would be prudent to admit him for further observation and stabilization.  I have written for holding orders, CIWA, medication for alcohol withdrawal, sedatives, for agitated/psychotic behavior. The psychiatric services have been consulted.   Ernest Melter, MD 11/14/12 [phone]

## 2012-11-14 NOTE — ED Provider Notes (Signed)
On review of patient's labs, he is shown to have hyponatremia. Review of his records show chronic hyponatremia, which is likely secondary to his alcohol use.  Dagmar Hait, MD 11/14/12 [phone]

## 2012-11-15 DIAGNOSIS — F339 Major depressive disorder, recurrent, unspecified: Secondary | ICD-10-CM
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
## 2012-11-15 NOTE — ED Notes (Signed)
Pt refusing CBG at this time 

## 2012-11-15 NOTE — ED Notes (Signed)
Pt brought himself to bathroom

## 2012-11-15 NOTE — [facility] Counselor (Signed)
Writer couldn't locate copy of pt's telespsych consult, not in pt's chart. Writer called [facility] and requested consult be faxed to [phone] in ACT office. Consult hadn't been faxed in yet.  Evette Cristal, [place] Assessment Counselor

## 2012-11-15 NOTE — Progress Notes (Signed)
Ernest Cryer Dorsett[birth year][id number]  Subjective Patient seen chart was reviewed.  Patient remained suicidal.  He is trying to get [place] in [place].  He cannot contract for safety.  Mental Status Examination Patient remained suicidal but cooperative.  His attention and concentration is fair.  He described his mood as sad and depressed and his affect is constricted.  He is alert and oriented x3.  Current Medication Current facility-administered medications:acetaminophen (TYLENOL) tablet 650 mg, 650 mg, Oral, Q4H PRN, Flint Melter, MD;  alum & mag hydroxide-simeth (MAALOX/MYLANTA) 200-200-20 MG/5ML suspension 30 mL, 30 mL, Oral, PRN, Flint Melter, MD;  aspirin EC tablet 81 mg, 81 mg, Oral, Daily, Gwyneth Sprout, MD, 81 mg at 11/15/12 1210;  folic acid (FOLVITE) tablet 1 mg, 1 mg, Oral, Daily, Flint Melter, MD, 1 mg at 11/15/12 1209 glipiZIDE (GLUCOTROL) tablet 10 mg, 10 mg, Oral, BID AC, Gwyneth Sprout, MD, 10 mg at 11/15/12 0854;  haloperidol (HALDOL) tablet 5 mg, 5 mg, Oral, Q6H PRN, Flint Melter, MD;  haloperidol lactate (HALDOL) injection 5 mg, 5 mg, Intramuscular, Q6H PRN, Flint Melter, MD;  ibuprofen (ADVIL,MOTRIN) tablet 600 mg, 600 mg, Oral, Q8H PRN, Flint Melter, MD, 600 mg at 11/15/12 1214 LORazepam (ATIVAN) injection 2 mg, 2 mg, Intravenous, Q6H PRN, Flint Melter, MD;  LORazepam (ATIVAN) tablet 2 mg, 2 mg, Oral, Q6H PRN, Flint Melter, MD;  metFORMIN (GLUCOPHAGE) tablet 500 mg, 500 mg, Oral, BID WC, Gwyneth Sprout, MD, 500 mg at 11/15/12 1610;  multivitamin with minerals tablet 1 tablet, 1 tablet, Oral, Daily, Flint Melter, MD, 1 tablet at 11/15/12 1209 nicotine (NICODERM CQ - dosed in mg/24 hours) patch 21 mg, 21 mg, Transdermal, Daily PRN, Flint Melter, MD;  ondansetron [facility]) tablet 4 mg, 4 mg, Oral, Q8H PRN, Flint Melter, MD;  thiamine (VITAMIN B-1) tablet 100 mg, 100 mg, Oral, Daily, Flint Melter, MD, 100 mg at 11/15/12 1208;  traZODone (DESYREL)  tablet 100 mg, 100 mg, Oral, QHS, Gwyneth Sprout, MD, 100 mg at 11/14/12 2127 zolpidem (AMBIEN) tablet 5 mg, 5 mg, Oral, QHS PRN, Flint Melter, MD Current outpatient prescriptions:aspirin 81 MG tablet, Take 81 mg by mouth daily., Disp: , Rfl: ;  fish oil-omega-3 fatty acids 1000 MG capsule, Take 2 g by mouth daily., Disp: , Rfl: ;  glipiZIDE (GLUCOTROL) 10 MG tablet, Take 10 mg by mouth 2 (two) times daily before a meal., Disp: , Rfl:  hydroxypropyl methylcellulose (ISOPTO TEARS) 2.5 % ophthalmic solution, Place 1 drop into both eyes 4 (four) times daily as needed (for dry eyes)., Disp: , Rfl: ;  metFORMIN (GLUCOPHAGE) 500 MG tablet, Take 500 mg by mouth 2 (two) times daily with a meal., Disp: , Rfl: ;  Oxycodone HCl 10 MG TABS, Take 10 mg by mouth 4 (four) times daily as needed (for pain)., Disp: , Rfl:  traZODone (DESYREL) 100 MG tablet, Take 100 mg by mouth at bedtime., Disp: , Rfl: ;  trolamine salicylate (ASPERCREME) 10 % cream, Apply 1 application topically as needed (to arms and legs)., Disp: , Rfl:    Assessment Axis I alcohol dependence , posttraumatic stress disorder Maj. depressive disorder recurrent  Plan Continue current treatment.  Recommended inpatient psychiatric treatment at the [facility].

## 2012-11-16 ENCOUNTER — Encounter (HOSPITAL_COMMUNITY): Payer: Self-pay

## 2012-11-16 ENCOUNTER — Inpatient Hospital Stay (HOSPITAL_COMMUNITY)
Admission: AD | Admit: 2012-11-16 | Discharge: 2012-11-26 | DRG: 897 | Disposition: A | Discharge status: Home / Self Care | Payer: Medicare Other | Source: Intra-hospital | Attending: Psychiatry | Admitting: Psychiatry

## 2012-11-16 DIAGNOSIS — F332 Major depressive disorder, recurrent severe without psychotic features: Secondary | ICD-10-CM

## 2012-11-16 DIAGNOSIS — F1020 Alcohol dependence, uncomplicated: Principal | ICD-10-CM | POA: Diagnosis present

## 2012-11-16 DIAGNOSIS — Z79899 Other long term (current) drug therapy: Secondary | ICD-10-CM

## 2012-11-16 DIAGNOSIS — I10 Essential (primary) hypertension: Secondary | ICD-10-CM | POA: Diagnosis present

## 2012-11-16 DIAGNOSIS — F39 Unspecified mood [affective] disorder: Secondary | ICD-10-CM

## 2012-11-16 DIAGNOSIS — E119 Type 2 diabetes mellitus without complications: Secondary | ICD-10-CM | POA: Diagnosis present

## 2012-11-16 DIAGNOSIS — R45851 Suicidal ideations: Secondary | ICD-10-CM

## 2012-11-16 DIAGNOSIS — F4310 Post-traumatic stress disorder, unspecified: Secondary | ICD-10-CM | POA: Diagnosis present

## 2012-11-16 DIAGNOSIS — F411 Generalized anxiety disorder: Secondary | ICD-10-CM | POA: Diagnosis present

## 2012-11-16 LAB — GLUCOSE, CAPILLARY: Glucose-Capillary: 198 mg/dL — ABNORMAL HIGH (ref 70–99)

## 2012-11-16 MED ORDER — HYDROXYZINE HCL 25 MG PO TABS
25.0000 mg | ORAL_TABLET | Freq: Four times a day (QID) | ORAL | Status: DC | PRN
  Administered 2012-11-16 – 2012-11-26 (×2): 25 mg via ORAL
  Filled 2012-11-16: qty 20

## 2012-11-16 MED ORDER — ALUM & MAG HYDROXIDE-SIMETH 200-200-20 MG/5ML PO SUSP: 30.0000 mL | ORAL | Status: DC | PRN

## 2012-11-16 MED ORDER — MUSCLE RUB 10-15 % EX CREA
TOPICAL_CREAM | CUTANEOUS | Status: DC | PRN
  Administered 2012-11-16 – 2012-11-21 (×6): via TOPICAL
  Administered 2012-11-23: 1 via TOPICAL
  Administered 2012-11-24 (×2): via TOPICAL
  Administered 2012-11-26 (×2): 1 via TOPICAL
  Filled 2012-11-16 (×2): qty 85

## 2012-11-16 MED ORDER — GLIPIZIDE 10 MG PO TABS
10.0000 mg | ORAL_TABLET | Freq: Two times a day (BID) | ORAL | Status: DC
  Administered 2012-11-16 – 2012-11-26 (×20): 10 mg via ORAL
  Filled 2012-11-16 (×2): qty 1
  Filled 2012-11-16: qty 2
  Filled 2012-11-16 (×9): qty 1
  Filled 2012-11-16: qty 2
  Filled 2012-11-16 (×10): qty 1
  Filled 2012-11-16: qty 2
  Filled 2012-11-16: qty 1

## 2012-11-16 MED ORDER — ASPIRIN EC 81 MG PO TBEC
81.0000 mg | DELAYED_RELEASE_TABLET | Freq: Every day | ORAL | Status: DC
  Administered 2012-11-17 – 2012-11-26 (×10): 81 mg via ORAL
  Filled 2012-11-16 (×12): qty 1

## 2012-11-16 MED ORDER — ACETAMINOPHEN 325 MG PO TABS
650.0000 mg | ORAL_TABLET | Freq: Four times a day (QID) | ORAL | Status: DC | PRN
  Administered 2012-11-17 – 2012-11-21 (×4): 650 mg via ORAL
  Filled 2012-11-16: qty 2

## 2012-11-16 MED ORDER — METFORMIN HCL 500 MG PO TABS
500.0000 mg | ORAL_TABLET | Freq: Two times a day (BID) | ORAL | Status: DC
  Administered 2012-11-16 – 2012-11-26 (×20): 500 mg via ORAL
  Filled 2012-11-16 (×23): qty 1

## 2012-11-16 MED ORDER — TRAZODONE HCL 100 MG PO TABS
100.0000 mg | ORAL_TABLET | Freq: Every day | ORAL | Status: DC
  Administered 2012-11-16 – 2012-11-22 (×6): 100 mg via ORAL
  Filled 2012-11-16 (×9): qty 1

## 2012-11-16 MED ORDER — MAGNESIUM HYDROXIDE 400 MG/5ML PO SUSP: 30.0000 mL | Freq: Every day | ORAL | Status: DC | PRN

## 2012-11-16 NOTE — Progress Notes (Addendum)
CSW received phone call from [facility] who states patient was just discharged last Friday. Pt was offered assistance, his plan was to follow up with pt sisters however would not allow [facility] to contact pt sisters.   Catha Gosselin, LCSWA  [phone] .11/16/2012 1508pm

## 2012-11-16 NOTE — ED Notes (Signed)
Pt has two belonging bags and one backpack in locker # 26.

## 2012-11-16 NOTE — ED Notes (Signed)
Pt refusing vitals at this time.

## 2012-11-16 NOTE — Tx Team (Signed)
Initial Interdisciplinary Treatment Plan  PATIENT STRENGTHS: (choose at least two) General fund of knowledge  PATIENT STRESSORS: Financial difficulties Health problems Substance abuse   PROBLEM LIST: Problem List/Patient Goals Date to be addressed Date deferred Reason deferred Estimated date of resolution  Depression 11/16/12     Risk for Suicide 11/16/12     Anxiety 11/16/12     PTSD 11/16/12                                    DISCHARGE CRITERIA:  Improved stabilization in mood, thinking, and/or behavior Safe-care adequate arrangements made  PRELIMINARY DISCHARGE PLAN: Attend aftercare/continuing care group Placement in alternative living arrangements  PATIENT/FAMIILY INVOLVEMENT: This treatment plan has been presented to and reviewed with the patient, Ernest [name].  The patient and family have been given the opportunity to ask questions and make suggestions.  Jacques Navy A 11/16/2012, 5:57 PM

## 2012-11-16 NOTE — ED Notes (Signed)
Pts belonging locked up by security including pocket knife, wallet (no cash) and 2 check books.

## 2012-11-16 NOTE — Progress Notes (Addendum)
CSW discussed pt with psychiatrist and NP. Patient accepted to [facility]1 to Dr. Dub Mikes. CSW and pt discussed patient wanting to eventually go to an assisted living. CSW spoke with [facility] who may consider patient once psychiatrically stable. CSW informed inpatient unit CSW and lead CSW. Patient stated he was interested in any ALF once psychiatrically stable.   Catha Gosselin, LCSWA  [phone] .11/16/2012 1457pm   Addendu: Patient able to transfer patient to wheelchair to bed, and wheelchair to commode. Patient calm and cooperative.   Catha Gosselin, LCSWA  [phone] .11/16/2012 1454pm

## 2012-11-16 NOTE — Progress Notes (Signed)
Patient Identification:  Ernest [name] Date of Evaluation:  11/16/2012  History of Present Illness:  Patient came for alcohol detoxification and depression.  Patient is also having difficulty Securing a place for living.  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  Patient reports feeling suicidal/homicidal with no plans and no mention of who he want to kill.  He did not want to be admitted anywhere else than the [facility] hospital but efforts to secure a place at the [place] failed.  Patient is using a w/c due to mobility problem and joint pain.  He is endorsing SI/HI/AH with voice telling him to kill himself.  We will admit him to our inpatient unit for safety and stabilization  And for the SW to assist him with placement after treatment.  Past Psychiatric History: PTSD, Depression   Past Medical History:     Past Medical History  Diagnosis Date  . Diabetes mellitus   . Hypertension   . Neuropathy   . Mental disorder   . Depression   . Anxiety       History reviewed. No pertinent past surgical history.  Allergies:  Allergies  Allergen Reactions  . Nicoderm (Nicotine) Rash    Uses losenges with out any problem    Current Medications:  Prior to Admission medications   Medication Sig Start Date End Date Taking? Authorizing Provider  aspirin 81 MG tablet Take 81 mg by mouth daily.   Yes Historical Provider, MD  fish oil-omega-3 fatty acids 1000 MG capsule Take 2 g by mouth daily.   Yes Historical Provider, MD  glipiZIDE (GLUCOTROL) 10 MG tablet Take 10 mg by mouth 2 (two) times daily before a meal.   Yes Historical Provider, MD  hydroxypropyl methylcellulose (ISOPTO TEARS) 2.5 % ophthalmic solution Place 1 drop into both eyes 4 (four) times daily as needed (for dry eyes).   Yes Historical Provider, MD  metFORMIN (GLUCOPHAGE) 500 MG tablet Take 500 mg by mouth 2 (two) times daily with a meal.   Yes Historical Provider, MD  Oxycodone HCl 10 MG TABS  Take 10 mg by mouth 4 (four) times daily as needed (for pain).   Yes Historical Provider, MD  traZODone (DESYREL) 100 MG tablet Take 100 mg by mouth at bedtime.   Yes Historical Provider, MD  trolamine salicylate (ASPERCREME) 10 % cream Apply 1 application topically as needed (to arms and legs).   Yes Historical Provider, MD    Social History:    reports that he has been smoking Cigarettes.  He has been smoking about 2.00 packs per day. He has never used smokeless tobacco. He reports that he drinks about 2.4 ounces of alcohol per week. He reports that he does not use illicit drugs.   Family History:    No family history on file.  Mental Status Examination/Evaluation:  He is disheveled, calm but exhibits an angry look.  He reports his mood to be depressed and he exhibits flat affect.  He is oriented x 3, his concentration and attention is normal.  His thought process and content includes suicide and homicidal ideation.  His insight and judgement is poor.   DIAGNOSIS:   AXIS I   Alcohol dependence, PTSD, Major depressive d/o recurrent severe  AXIS II  Deffered  AXIS III See medical notes.  AXIS IV housing problems, occupational problems, problems related to social environment and problems with primary support group  AXIS V  51-60 moderate symptoms     Assessment/Plan:  Consult with Dr Lolly Mustache  Patient is admitted to our inpatient Chemical dependence  For alcohol dependence  I agreed with the findings and involved in the treatment plan. Kathryne Sharper, MD

## 2012-11-16 NOTE — ED Notes (Signed)
Pt able to transfer from wheelchair to toilet back to wheelchair without assistance.
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
## 2012-11-16 NOTE — ED Notes (Signed)
Pt refused vitals, refused medication

## 2012-11-16 NOTE — ED Notes (Signed)
Social worker contacted currently trying to find placement.

## 2012-11-16 NOTE — Progress Notes (Addendum)
CSW spoke with Victorino Dike at [facility] who stated that no beds available at this time, and to not to referr patient until beds available. Victorino Dike stated she would call when bed available to review information. She directed CSW to referr patient to [facility] as they are the only [facility] facility with psych beds available at this time in .   CSW referred patient to [facility], and info is being reviewed. Per Rene Kocher at [facility], an available bed is not guaruntee as there are other vets in the ED there waiting for placement. Rene Kocher informed CSW to refer patient to non [facility] hosptials.    CSW spoke with [facility], who stated they are on diversion due to no beds.     Doree Albee  [phone] 11/16/2012 8:45am   Addendum: CSW referred patient to [facility] pending review. CSW [facility] reivew.   Catha Gosselin, LCSWA  [phone] 11/16/2012

## 2012-11-16 NOTE — Progress Notes (Addendum)
Per Rene Kocher at [facility] pt pending review. At this time, locked unit is not accepting any more patients.  Per [facility] and [facility], not accepting any new referrals at this time.  Pt pending review at [facility]  Pt pending review at [facility]  Pt pending review at [facility].   No beds at [facility], [facility], [place],  .Catha Gosselin, [place]  [phone] .11/16/2012 1330pm

## 2012-11-17 ENCOUNTER — Encounter (HOSPITAL_COMMUNITY): Payer: Self-pay | Admitting: Psychiatry

## 2012-11-17 DIAGNOSIS — F1994 Other psychoactive substance use, unspecified with psychoactive substance-induced mood disorder: Secondary | ICD-10-CM

## 2012-11-17 DIAGNOSIS — F1020 Alcohol dependence, uncomplicated: Principal | ICD-10-CM

## 2012-11-17 DIAGNOSIS — F39 Unspecified mood [affective] disorder: Secondary | ICD-10-CM | POA: Diagnosis present

## 2012-11-17 DIAGNOSIS — F4310 Post-traumatic stress disorder, unspecified: Secondary | ICD-10-CM

## 2012-11-17 LAB — GLUCOSE, CAPILLARY
Glucose-Capillary: 77 mg/dL (ref 70–99)
Glucose-Capillary: 160 mg/dL — ABNORMAL HIGH (ref 70–99)

## 2012-11-17 MED ORDER — SERTRALINE HCL 50 MG PO TABS
50.0000 mg | ORAL_TABLET | Freq: Every day | ORAL | Status: DC
  Administered 2012-11-17 – 2012-11-26 (×10): 50 mg via ORAL
  Filled 2012-11-17 (×4): qty 1
  Filled 2012-11-17: qty 4
  Filled 2012-11-17 (×8): qty 1

## 2012-11-17 MED ORDER — NICOTINE POLACRILEX 2 MG MT GUM
2.0000 mg | CHEWING_GUM | OROMUCOSAL | Status: DC
  Administered 2012-11-17 – 2012-11-26 (×28): 2 mg via ORAL
  Filled 2012-11-17 (×52): qty 1

## 2012-11-17 MED ORDER — NICOTINE POLACRILEX 2 MG MT GUM
2.0000 mg | CHEWING_GUM | OROMUCOSAL | Status: DC | PRN
  Administered 2012-11-17 – 2012-11-24 (×9): 2 mg via ORAL

## 2012-11-17 MED ORDER — INSULIN ASPART 100 UNIT/ML ~~LOC~~ SOLN: 0.0000 [IU] | Freq: Three times a day (TID) | SUBCUTANEOUS | Status: DC

## 2012-11-17 NOTE — Progress Notes (Signed)
Adult Psychoeducational Group Note  Date:  11/17/2012 Time:  10:29 PM  Group Topic/Focus:  Goals Group:   The focus of this group is to help patients establish daily goals to achieve during treatment and discuss how the patient can incorporate goal setting into their daily lives to aide in recovery.  Participation Level:  None  Participation Quality:  didnt attend  Affect:  didnt attend  Cognitive:  didnt attend  Insight: None  Engagement in Group:  didnt attend  Modes of Intervention:  didnt attend  Additional Comments:  Pt stayed in bed   Aldona Lento 11/17/2012, 10:29 PM

## 2012-11-17 NOTE — Progress Notes (Signed)
Adult Psychoeducational Group Note  Date:  11/17/2012 Time:  11:07 AM  Group Topic/Focus:  Theraputic Activity  Participation Level:  Did Not Attend  Participation Quality:  did not attend  Affect:  did not attend  Cognitive:  did not attend  Insight: None  Engagement in Group:  None  Modes of Intervention:  Activity  Additional Comments:  Ernest [name] did not attend the activity   Nichola Sizer 11/17/2012, 11:07 AM
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
## 2012-11-17 NOTE — Progress Notes (Signed)
Recreation Therapy Notes  Date: 08.05.2014 Time: 2:45pm Location: [address] Dayroom  Group Topic: Animal Assisted Activities  Goal Area(s) Addresses:  Patient will interact appropriately with dog team.    Behavioral Response: Did not attend.  Marykay Lex Samanta Gal, LRT/CTRS  Jearl Klinefelter 11/17/2012 4:31 PM

## 2012-11-17 NOTE — [facility] Suicide Risk Assessment (Signed)
Suicide Risk Assessment  Admission Assessment     Nursing information obtained from:  Patient Demographic factors:  Male;Age 72 or older;Divorced or widowed;Living alone;Unemployed;Caucasian;Low socioeconomic status Current Mental Status:  NA Loss Factors:  Decline in physical health;Financial problems / change in socioeconomic status Historical Factors:  NA Risk Reduction Factors:  NA  CLINICAL FACTORS:   Depression:   Comorbid alcohol abuse/dependence Impulsivity Insomnia Alcohol/Substance Abuse/Dependencies  COGNITIVE FEATURES THAT CONTRIBUTE TO RISK:  Closed-mindedness Polarized thinking Thought constriction (tunnel vision)    SUICIDE RISK:   Moderate:  Frequent suicidal ideation with limited intensity, and duration, some specificity in terms of plans, no associated intent, good self-control, limited dysphoria/symptomatology, some risk factors present, and identifiable protective factors, including available and accessible social support.  PLAN OF CARE: Supportive approach/coping skills/relapse prevention                               Identify detox needs                               Reassess and address the co morbidites  I certify that inpatient services furnished can reasonably be expected to improve the patient's condition.  Joleene Burnham A 11/17/2012, 12:30 PM
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
## 2012-11-17 NOTE — Progress Notes (Signed)
The focus of this group is to educate the patient on the purpose and policies of crisis stabilization and provide a format to answer questions about their admission.  The group details unit policies and expectations of patients while admitted. Patient did not attend. 

## 2012-11-17 NOTE — Progress Notes (Signed)
D: Pt has been quite irritable @ times. Pt,s CBG @ 1700 = 160. That called for 3 units of SSI coverage. Pt refused the coverage & was ranting that he never takes insulin. Pt also refused Lab. Draw this PM. Lab was re-ordered for AM on 8/6.A: Pt supported & encouraged. Pt using W/C for generalized weakness.Continues on 15 minute checks.Pt safety maintained.

## 2012-11-17 NOTE — Progress Notes (Signed)
D: Pt refused to get his CBG checked @ 2200.

## 2012-11-17 NOTE — [facility] Group Notes (Signed)
[facility] LCSW Group Therapy  11/17/2012 1:49 PM  Type of Therapy:  Group Therapy  Participation Level:  Did Not Attend  [name], Ernest Mchatton 11/17/2012, 1:49 PM

## 2012-11-17 NOTE — Progress Notes (Signed)
Recreation Therapy Notes   Date: 08.05.2014 Time: 3:10pm Location: [address] Dayroom  Group Topic: Problem Solving  Goal Area(s) Addresses:  Patient will use effective problem solving techniques to solve puzzles provided. Patient will verbalize skills used during activity. Patient will verbalize how skills can be applied post d/c.   Behavioral Response: Did not attend  Jearl Klinefelter, LRT/CTRS  Jearl Klinefelter 11/17/2012 4:45 PM

## 2012-11-17 NOTE — Progress Notes (Signed)
Patient ID: GARHETT BERNHARD, male   DOB: [birth year], 72 y.o.   MRN: [id number] D-Patient ate breakfast in his room this am.  He reports his appetite is poor.  He ate eggs and grits for breakfast.  He did not eat the bacon or roll.  A- Asked patient questions on self inventory and he started answering them , then got irritable and raised his voice saying he did not wan tto answer anymore questions. A- Talked with patient a about what he needed.  R-Patient tries to do things for himself but needed help with changing depends and putting on his jeans and shoes.  He also wanted more coffee and nicorette gum.  He says he smokes one and a half packs of cigarettes today.  Patient spilled his coffee and was very appreciative when staff cleaned it up, replaced it and then changed his bed .Patient wanted to be in dayroom with peers and he is getting around in his wheelchair and interacting in dayroom with peers.  He is a fall risk.  Reviewed fall precautions with him. Gave him a denture cup for his dentures.
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
## 2012-11-18 DIAGNOSIS — F39 Unspecified mood [affective] disorder: Secondary | ICD-10-CM

## 2012-11-18 LAB — GLUCOSE, CAPILLARY
Glucose-Capillary: 177 mg/dL — ABNORMAL HIGH (ref 70–99)
Glucose-Capillary: 129 mg/dL — ABNORMAL HIGH (ref 70–99)
Glucose-Capillary: 100 mg/dL — ABNORMAL HIGH (ref 70–99)

## 2012-11-18 MED ORDER — ADULT MULTIVITAMIN W/MINERALS CH
1.0000 | ORAL_TABLET | Freq: Every day | ORAL | Status: DC
  Administered 2012-11-18 – 2012-11-26 (×9): 1 via ORAL
  Filled 2012-11-18 (×10): qty 1

## 2012-11-18 MED ORDER — ENSURE COMPLETE PO LIQD
237.0000 mL | Freq: Two times a day (BID) | ORAL | Status: DC
  Administered 2012-11-19 – 2012-11-26 (×14): 237 mL via ORAL

## 2012-11-18 NOTE — Progress Notes (Signed)
D: Pt. Has been using W/C to get around.Pt using depends for periods of incontinence. Pt refused lab. this PM. Lab re-ordered for AM 8/7.CBG @ HS = 100. A: Supported & encouraged. Continues on 15 minute checks. R: Pt safety maintained.

## 2012-11-18 NOTE — Progress Notes (Addendum)
Adult Psychoeducational Group Note  Date:  11/18/2012 Time:  5:16 PM  Group Topic/Focus:  Personal Choices and Values:   The focus of this group is to help patients assess and explore the importance of values in their lives, how their values affect their decisions, how they express their values and what opposes their expression.  Participation Level:  Minimal  Participation Quality:  Resistant  Affect:  Flat and Irritable  Cognitive:  Alert  Insight: Lacking  Engagement in Group:  Resistant  Modes of Intervention:  Discussion, Education and Support  Additional Comments:  Pt was asked to participate but was resistant and this Clinical research associate passed him, he seemed pleased.  Reynolds Bowl 11/18/2012, 5:16 PM

## 2012-11-18 NOTE — [facility] Group Notes (Signed)
[facility] LCSW Aftercare Discharge Planning Group Note   11/18/2012 9:22 AM  Participation Quality:  DID NOT ATTEND   Smart, Ernest [name]

## 2012-11-18 NOTE — Progress Notes (Signed)
NUTRITION ASSESSMENT  Pt identified as at risk on the Malnutrition Screen Tool  INTERVENTION: 1. Educated patient on the importance of nutrition and encouraged intake of food and beverages. 2. Discussed weight goals. 3. Supplements: Ensure Complete po BID, each supplement provides 350 kcal and 13 grams of protein. 4.  MVI daily  NUTRITION DIAGNOSIS: Predicted sub optimal intake related to ETOH use AEB patient report.  Goal: Pt to meet >/= 90% of their estimated nutrition needs.  Monitor:  PO intake  Assessment:  Patient admitted with ETOH abuse.  Sodium of 127.  On Glucophage and CHO MOD diet for diabetes.  Reports new dentures and asking for denture adhesive.  This was obtained.  Agreed to Ensure.  Fair intake prior to admit.  Fair appetite.  Good intake currently.  UBW 168 lbs but does not know when.  Denies weight loss.  72 y.o. male  Height: Ht Readings from Last 1 Encounters:  11/16/12 6' (1.829 m)    Weight: Wt Readings from Last 1 Encounters:  11/16/12 178 lb (80.74 kg)    Weight Hx: Wt Readings from Last 10 Encounters:  11/16/12 178 lb (80.74 kg)    BMI:  Body mass index is 24.14 kg/(m^2). Pt meets criteria for wnl based on current BMI.  Estimated Nutritional Needs: Kcal: 25-30 kcal/kg Protein: > 1 gram protein/kg Fluid: 1 ml/kcal  Diet Order: Carb Control Pt is also offered choice of unit snacks mid-morning and mid-afternoon.  Pt is eating as desired.   Lab results and medications reviewed.   Oran Rein, RD, LDN Clinical Inpatient Dietitian Pager:  [phone] Weekend and after hours pager:  [phone]
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
## 2012-11-18 NOTE — [facility] Group Notes (Signed)
[facility] LCSW Group Therapy  11/18/2012 2:16 PM  Type of Therapy:  Group Therapy  Participation Level:  Did Not Attend  Smart, Mannie Wineland 11/18/2012, 2:16 PM

## 2012-11-19 LAB — GLUCOSE, CAPILLARY
Glucose-Capillary: 214 mg/dL — ABNORMAL HIGH (ref 70–99)
Glucose-Capillary: 114 mg/dL — ABNORMAL HIGH (ref 70–99)

## 2012-11-19 LAB — HEMOGLOBIN A1C: Mean Plasma Glucose: 148 mg/dL — ABNORMAL HIGH (ref <117)

## 2012-11-19 NOTE — Progress Notes (Signed)
Patient ID: Ernest [name], male   DOB: Aug 18, 1940, 72 y.o.   MRN: [id number] PER STATE REGULATIONS 482.30  THIS CHART WAS REVIEWED FOR MEDICAL NECESSITY WITH RESPECT TO THE PATIENT'S ADMISSION/ DURATION OF STAY.  NEXT REVIEW DATE: 11/23/2012  Willa Rough, RN, BSN CASE MANAGER

## 2012-11-19 NOTE — [facility] Group Notes (Signed)
[facility] LCSW Group Therapy  11/19/2012 2:17 PM  Type of Therapy:  Group Therapy  Participation Level:  Minimal  Participation Quality:  Attentive  Affect:  Appropriate  Cognitive:  Oriented  Insight:  Limited  Engagement in Therapy:  Limited  Modes of Intervention:  Discussion, Education, Exploration, Socialization and Support  Summary of Progress/Problems:  Finding Balance in Life. Today's group focused on defining balance in one's own words, identifying things that can knock one off balance, and exploring healthy ways to maintain balance in life. Group members were asked to provide an example of a time when they felt off balance, describe how they handled that situation,and process healthier ways to regain balance in the future. Group members were asked to share the most important tool for maintaining balance that they learned while at [facility] and how they plan to apply this method after discharge. Goldman was attentive throughout the day's group but did not actively participate unless asked a specific question by CSW. Colyn stated that he needs to work on his patience but could not identify how he would do this. Other group members offered suggestions and encouragement. Raylyn shows limited progress in the group setting. He was attentive and listened as others participated but did not actively participate in group discussion. He was able to name something that he needed to work on in order to regain balance-patience, but could not identify ways to do this.    Smart, Euphemia Lingerfelt 11/19/2012, 2:17 PM

## 2012-11-19 NOTE — Progress Notes (Signed)
D:  Patient up and active in the milieu today.  Attending some groups.  Loud at times.  Frequently states he is going to go into assisted living.  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  He rates depression at 7, hopelessness at 5, and anxiety at 7.  Rates sleep and appetite as poor.  Continues to be incontinent of urine.  A:  Medications given as prescribed.  Encouraged participation in groups.    R:  Cooperative with staff.  Interacting well with peers.  Tolerating medications well.  Safety is maintained on the unit with 15 minute checks.

## 2012-11-19 NOTE — [facility] Suicide Risk Assessment (Signed)
[facility] INPATIENT:  Family/Significant Other Suicide Prevention Education  Suicide Prevention Education:  Patient Refusal for Family/Significant Other Suicide Prevention Education: The patient Ernest [name] has refused to provide written consent for family/significant other to be provided Family/Significant Other Suicide Prevention Education during admission and/or prior to discharge.  Physician notified.  SPE completed with pt and he was given SPI pamphlet. Marquez was encouraged to ask questions and talk about any concerns relating to SPE.   Smart, Genny Caulder 11/19/2012, 4:27 PM

## 2012-11-19 NOTE — Progress Notes (Signed)
Patient did not attend the evening karaoke group. Pt remained in bed during group.  

## 2012-11-19 NOTE — Progress Notes (Signed)
Patient ID: Ernest [name], male   DOB: [birth year], 72 y.o.   MRN: [id number]  Patient asleep; no s/s of distress noted at this time. Respirations regular and unlabored.

## 2012-11-19 NOTE — Progress Notes (Signed)
Patient ID: Ernest [name], male   DOB: 1940/09/07, 72 y.o.   MRN: [id number] [facility] MD Progress Note  11/19/2012 11:43 AM Ernest [name]  MRN:  [id number]  Subjective:  Ernest [name] continue to endorse feeling depressed. He admits feeling suicidal/homicidal, however, denies any intent and or plans to hurt himself and or others. He says he is feeling this bad because he needed his pain and anxiety medicines, and could not understand why he is not getting them. He did ask, is this a hospital or what?, and if it is, why can't he receive the medicines that he knows worked for him in the past. Ernest [name] uses wheel chair for mobility. He propels his wheel chair per self.  Diagnosis:  Alcohol Dependence, PTSD, Mood Disorder NOS  ADL's:  Intact  Sleep: Fair  Appetite:  Fair  Suicidal Ideation:  Plan:  denies Intent:  denies Means:  denies Homicidal Ideation:  Plan:  denies Intent:  denies Means:  denies  AEB (as evidenced by):  Psychiatric Specialty Exam: Review of Systems  HENT: Negative.   Eyes: Negative.   Respiratory: Negative.   Cardiovascular: Negative.   Gastrointestinal: Negative.   Genitourinary: Negative.   Musculoskeletal: Positive for myalgias, back pain and joint pain.  Skin: Negative.   Neurological: Positive for dizziness and weakness.  Endo/Heme/Allergies: Negative.   Psychiatric/Behavioral: Positive for depression, suicidal ideas and substance abuse. The patient is nervous/anxious and has insomnia.     Blood pressure 143/75, pulse 66, temperature 97.6 F (36.4 C), temperature source Oral, resp. rate 17, height 6' (1.829 m), weight 80.74 kg (178 lb).Body mass index is 24.14 kg/(m^2).  General Appearance: Disheveled  Eye Solicitor::  Fair  Speech:  Clear and Coherent and Slow  Volume:  fluctuates  Mood:  Anxious, Depressed and Irritable  Affect:  Labile  Thought Process:  Coherent and Goal Directed  Orientation:  Full (Time, Place, and Person)  Thought Content:   worries, concerns, wanting to be placed  Suicidal Thoughts:  Yes.  without intent/plan  Homicidal Thoughts:  No  Memory:  Immediate;   Fair Recent;   Fair Remote;   Fair  Judgement:  Fair  Insight:  Shallow  Psychomotor Activity:  Restlessness  Concentration:  Fair  Recall:  Fair  Akathisia:  No  Handed:  Right  AIMS (if indicated):     Assets:  Desire for Improvement  Sleep:  Number of Hours: 6   Current Medications: Current Facility-Administered Medications  Medication Dose Route Frequency Provider Last Rate Last Dose  . acetaminophen (TYLENOL) tablet 650 mg  650 mg Oral Q6H PRN Earney Navy, NP   650 mg at 11/19/12 0841  . alum & mag hydroxide-simeth (MAALOX/MYLANTA) 200-200-20 MG/5ML suspension 30 mL  30 mL Oral Q4H PRN Earney Navy, NP      . aspirin EC tablet 81 mg  81 mg Oral Daily Earney Navy, NP   81 mg at 11/19/12 0836  . feeding supplement (ENSURE COMPLETE) liquid 237 mL  237 mL Oral BID BM Jeoffrey Massed, RD   237 mL at 11/19/12 1019  . glipiZIDE (GLUCOTROL) tablet 10 mg  10 mg Oral BID AC Earney Navy, NP   10 mg at 11/19/12 8469  . hydrOXYzine (ATARAX/VISTARIL) tablet 25 mg  25 mg Oral Q6H PRN Kerry Hough, PA-C   25 mg at 11/16/12 2249  . insulin aspart (novoLOG) injection 0-15 Units  0-15 Units Subcutaneous TID WC Sanjuana Kava, NP      .  magnesium hydroxide (MILK OF MAGNESIA) suspension 30 mL  30 mL Oral Daily PRN Earney Navy, NP      . metFORMIN (GLUCOPHAGE) tablet 500 mg  500 mg Oral BID WC Earney Navy, NP   500 mg at 11/19/12 0836  . multivitamin with minerals tablet 1 tablet  1 tablet Oral Daily Jeoffrey Massed, RD   1 tablet at 11/19/12 0836  . MUSCLE RUB CREA   Topical PRN Rachael Fee, MD      . nicotine polacrilex (NICORETTE) gum 2 mg  2 mg Oral Q4H while awake Rachael Fee, MD   2 mg at 11/19/12 1019   And  . nicotine polacrilex (NICORETTE) gum 2 mg  2 mg Oral PRN Rachael Fee, MD   2 mg at 11/18/12 2050  .  sertraline (ZOLOFT) tablet 50 mg  50 mg Oral Daily Sanjuana Kava, NP   50 mg at 11/19/12 0836  . traZODone (DESYREL) tablet 100 mg  100 mg Oral QHS Earney Navy, NP   100 mg at 11/18/12 2049    Lab Results:  Results for orders placed during the hospital encounter of 11/16/12 (from the past 48 hour(s))  GLUCOSE, CAPILLARY     Status: None   Collection Time    11/17/12 12:03 PM      Result Value Range   Glucose-Capillary 77  70 - 99 mg/dL  GLUCOSE, CAPILLARY     Status: Abnormal   Collection Time    11/17/12  4:09 PM      Result Value Range   Glucose-Capillary 160 (*) 70 - 99 mg/dL   Comment 1 Notify RN    GLUCOSE, CAPILLARY     Status: Abnormal   Collection Time    11/18/12  6:00 AM      Result Value Range   Glucose-Capillary 177 (*) 70 - 99 mg/dL   Comment 1 Notify RN    GLUCOSE, CAPILLARY     Status: Abnormal   Collection Time    11/18/12 12:02 PM      Result Value Range   Glucose-Capillary 129 (*) 70 - 99 mg/dL   Comment 1 Notify RN    GLUCOSE, CAPILLARY     Status: Abnormal   Collection Time    11/18/12  5:13 PM      Result Value Range   Glucose-Capillary 193 (*) 70 - 99 mg/dL  GLUCOSE, CAPILLARY     Status: Abnormal   Collection Time    11/18/12  8:44 PM      Result Value Range   Glucose-Capillary 100 (*) 70 - 99 mg/dL   Comment 1 Notify RN      Physical Findings: AIMS: Facial and Oral Movements Muscles of Facial Expression: None, normal Lips and Perioral Area: None, normal Jaw: None, normal Tongue: None, normal,Extremity Movements Upper (arms, wrists, hands, fingers): None, normal Lower (legs, knees, ankles, toes): None, normal, Trunk Movements Neck, shoulders, hips: None, normal, Overall Severity Severity of abnormal movements (highest score from questions above): None, normal Incapacitation due to abnormal movements: None, normal Patient's awareness of abnormal movements (rate only patient's report): No Awareness, Dental Status Current problems with  teeth and/or dentures?: No Does patient usually wear dentures?: Yes  CIWA:  CIWA-Ar Total: 1 COWS:  COWS Total Score: 0  Treatment Plan Summary: Daily contact with patient to assess and evaluate symptoms and progress in treatment Medication management  Plan: Supportive approach/coping skills/relapse prevention Optimize treatment with psychotropics. Facilitate/explore placement  options. Discharge plans in progress.  Medical Decision Making Problem Points:  Review of psycho-social stressors (1) Data Points:  Review of medication regiment & side effects (2)  I certify that inpatient services furnished can reasonably be expected to improve the patient's condition.   Armandina Stammer I, PMHNP, FNP-BC 11/19/2012, 11:43 AM

## 2012-11-19 NOTE — Clinical Social Work Note (Signed)
Pt asking for placement in nursing home or ALF today. CSW informed pt that he had refused assistance with referrals for the past two days. Pt and CSW discussed options. Pt agreed that admittance back into i/p psych unit or PTSD program at [facility] would be best option for him at this time. CSW called [facility] to check if space available in [facility] program. [facility] CSW referred to psych unit and received  pt transfer #.  [id number], [phone]. Left voicemail for case manager to call back.

## 2012-11-19 NOTE — Progress Notes (Addendum)
Recreation Therapy Notes  Date: 08.07.2014 Time: 3:00pm Location: [address] Dayroom  Group Topic: Communication, Team Building, Problem Solving  Goal Area(s) Addresses:  Patient will work effectively in teams to accomplish shared goal. Patient will identify skills used to make activity successful. Patient will relate skills used in group session to positive lifestyle changes.  Behavioral Response: Did not attend.   Emelyn Roen L Caetano Oberhaus, LRT/CTRS  Kerstyn Coryell L 11/19/2012 9:00 PM 

## 2012-11-19 NOTE — Clinical Social Work Note (Signed)
Pt FL2 completed and PASRR #: [id number] K. Referral sent to [facility] at 4:15PM for pt.

## 2012-11-20 LAB — GLUCOSE, CAPILLARY
Glucose-Capillary: 184 mg/dL — ABNORMAL HIGH (ref 70–99)
Glucose-Capillary: 150 mg/dL — ABNORMAL HIGH (ref 70–99)

## 2012-11-20 NOTE — Progress Notes (Signed)
Refused HS CBG testing.

## 2012-11-20 NOTE — Progress Notes (Signed)
[facility] MD Progress Note  11/20/2012 12:12 PM Ernest [name]  MRN:  [id number] Subjective:  Homer continues to endorse anxiety, worry, concerns. He is wanting to go into an assisted living facility. States he is not going to be able to make it if he was not in a structured place. States he does not need to go to another rehab. He is committed to abstinence. Concerned about his mood his anxiety.  Diagnosis:  Mood Disorder NOS, PTSD, Alcohol Dependence  ADL's:  Intact  Sleep: Fair  Appetite:  Fair  Suicidal Ideation:  Plan:  denies Intent:  denies Means:  denies Homicidal Ideation:  Plan:  denies Intent:  denies Means:  denies AEB (as evidenced by):  Psychiatric Specialty Exam: Review of Systems  Constitutional: Negative.   HENT: Negative.   Eyes: Negative.   Respiratory: Negative.   Cardiovascular: Negative.   Gastrointestinal: Positive for constipation.  Genitourinary: Negative.   Musculoskeletal: Positive for back pain.  Skin: Negative.   Neurological: Negative.   Endo/Heme/Allergies: Negative.   Psychiatric/Behavioral: Positive for depression and substance abuse. The patient is nervous/anxious.     Blood pressure 119/70, pulse 72, temperature 97.6 F (36.4 C), temperature source Oral, resp. rate 16, height 6' (1.829 m), weight 80.74 kg (178 lb).Body mass index is 24.14 kg/(m^2).  General Appearance: Disheveled  Eye Solicitor::  Fair  Speech:  Clear and Coherent  Volume:  fluctuates  Mood:  Anxious, Irritable and worried  Affect:  anxious, worried  Thought Process:  Coherent and Goal Directed  Orientation:  Full (Time, Place, and Person)  Thought Content:  worries, concerns about placement  Suicidal Thoughts:  Intermittent  Homicidal Thoughts:  No  Memory:  Immediate;   Fair Recent;   Fair Remote;   Fair  Judgement:  Fair  Insight:  superficial  Psychomotor Activity:  Restlessness  Concentration:  Fair  Recall:  Fair  Akathisia:  No  Handed:  Right  AIMS  (if indicated):     Assets:  Desire for Improvement  Sleep:  Number of Hours: 6   Current Medications: Current Facility-Administered Medications  Medication Dose Route Frequency Provider Last Rate Last Dose  . acetaminophen (TYLENOL) tablet 650 mg  650 mg Oral Q6H PRN Earney Navy, NP   650 mg at 11/19/12 0841  . alum & mag hydroxide-simeth (MAALOX/MYLANTA) 200-200-20 MG/5ML suspension 30 mL  30 mL Oral Q4H PRN Earney Navy, NP      . aspirin EC tablet 81 mg  81 mg Oral Daily Earney Navy, NP   81 mg at 11/20/12 0829  . feeding supplement (ENSURE COMPLETE) liquid 237 mL  237 mL Oral BID BM Jeoffrey Massed, RD   237 mL at 11/20/12 1028  . glipiZIDE (GLUCOTROL) tablet 10 mg  10 mg Oral BID AC Earney Navy, NP   10 mg at 11/20/12 0645  . hydrOXYzine (ATARAX/VISTARIL) tablet 25 mg  25 mg Oral Q6H PRN Kerry Hough, PA-C   25 mg at 11/16/12 2249  . insulin aspart (novoLOG) injection 0-15 Units  0-15 Units Subcutaneous TID WC Sanjuana Kava, NP      . magnesium hydroxide (MILK OF MAGNESIA) suspension 30 mL  30 mL Oral Daily PRN Earney Navy, NP      . metFORMIN (GLUCOPHAGE) tablet 500 mg  500 mg Oral BID WC Earney Navy, NP   500 mg at 11/20/12 0830  . multivitamin with minerals tablet 1 tablet  1 tablet Oral Daily  Jeoffrey Massed, RD   1 tablet at 11/20/12 [phone]  . MUSCLE RUB CREA   Topical PRN Rachael Fee, MD      . nicotine polacrilex (NICORETTE) gum 2 mg  2 mg Oral Q4H while awake Rachael Fee, MD   2 mg at 11/20/12 1031   And  . nicotine polacrilex (NICORETTE) gum 2 mg  2 mg Oral PRN Rachael Fee, MD   2 mg at 11/19/12 1258  . sertraline (ZOLOFT) tablet 50 mg  50 mg Oral Daily Sanjuana Kava, NP   50 mg at 11/20/12 0830  . traZODone (DESYREL) tablet 100 mg  100 mg Oral QHS Earney Navy, NP   100 mg at 11/19/12 2008    Lab Results:  Results for orders placed during the hospital encounter of 11/16/12 (from the past 48 hour(s))  GLUCOSE, CAPILLARY      Status: Abnormal   Collection Time    11/18/12  5:13 PM      Result Value Range   Glucose-Capillary 193 (*) 70 - 99 mg/dL  GLUCOSE, CAPILLARY     Status: Abnormal   Collection Time    11/18/12  8:44 PM      Result Value Range   Glucose-Capillary 100 (*) 70 - 99 mg/dL   Comment 1 Notify RN    HEMOGLOBIN A1C     Status: Abnormal   Collection Time    11/19/12  6:40 AM      Result Value Range   Hemoglobin A1C 6.8 (*) <5.7 %   Comment: (NOTE)                                                                               According to the ADA Clinical Practice Recommendations for 2011, when     HbA1c is used as a screening test:      >=6.5%   Diagnostic of Diabetes Mellitus               (if abnormal result is confirmed)     5.7-6.4%   Increased risk of developing Diabetes Mellitus     References:Diagnosis and Classification of Diabetes Mellitus,Diabetes     Care,2011,34(Suppl 1):S62-S69 and Standards of Medical Care in             Diabetes - 2011,Diabetes Care,2011,34 (Suppl 1):S11-S61.   Mean Plasma Glucose 148 (*) <117 mg/dL   Comment: Performed at [facility]  GLUCOSE, CAPILLARY     Status: Abnormal   Collection Time    11/19/12 11:44 AM      Result Value Range   Glucose-Capillary 214 (*) 70 - 99 mg/dL   Comment 1 Documented in Chart     Comment 2 Notify RN    GLUCOSE, CAPILLARY     Status: Abnormal   Collection Time    11/19/12  5:08 PM      Result Value Range   Glucose-Capillary 195 (*) 70 - 99 mg/dL  GLUCOSE, CAPILLARY     Status: Abnormal   Collection Time    11/19/12 10:19 PM      Result Value Range   Glucose-Capillary 114 (*) 70 - 99 mg/dL   Comment  1 Notify RN     Comment 2 Documented in Chart    GLUCOSE, CAPILLARY     Status: Abnormal   Collection Time    11/20/12  6:17 AM      Result Value Range   Glucose-Capillary 189 (*) 70 - 99 mg/dL  GLUCOSE, CAPILLARY     Status: Abnormal   Collection Time    11/20/12 12:07 PM      Result Value Range    Glucose-Capillary 184 (*) 70 - 99 mg/dL    Physical Findings: AIMS: Facial and Oral Movements Muscles of Facial Expression: None, normal Lips and Perioral Area: None, normal Jaw: None, normal Tongue: None, normal,Extremity Movements Upper (arms, wrists, hands, fingers): None, normal Lower (legs, knees, ankles, toes): None, normal, Trunk Movements Neck, shoulders, hips: None, normal, Overall Severity Severity of abnormal movements (highest score from questions above): None, normal Incapacitation due to abnormal movements: None, normal Patient's awareness of abnormal movements (rate only patient's report): No Awareness, Dental Status Current problems with teeth and/or dentures?: No Does patient usually wear dentures?: No  CIWA:  CIWA-Ar Total: 1 COWS:  COWS Total Score: 0  Treatment Plan Summary: Daily contact with patient to assess and evaluate symptoms and progress in treatment Medication management  Plan: Supportive approach/coping skills/relapse prevention           Reassess and address the co morbidities            Explore placement options  Medical Decision Making Problem Points:  Review of psycho-social stressors (1) Data Points:  Review of medication regiment & side effects (2)  I certify that inpatient services furnished can reasonably be expected to improve the patient's condition.   Dmonte Maher A 11/20/2012, 12:12 PM

## 2012-11-20 NOTE — BHH Group Notes (Signed)
[facility] LCSW Group Therapy  11/20/2012 2:08 PM  Type of Therapy:  Group Therapy  Participation Level:  Did Not Attend  [name], Ernest Magro 11/20/2012, 2:08 PM

## 2012-11-20 NOTE — Progress Notes (Signed)
D) Pt requires assistance with his ADL's. Is incontinent. Pt rates his depression at an 8 his hopelessness at a 0 and his anxiety at a 7. States that he is still having thoughts of SI. Talked about going to a nursing home because "I can't take care of myself". A) Pt. Given a bath and a shave. Linens changed and clothing washed. Given support and reassurance along with praise. R) Continues to endorse thoughts of SI on and off. Contracts for his safety.

## 2012-11-20 NOTE — BHH Group Notes (Signed)
[facility] LCSW Aftercare Discharge Planning Group Note   11/20/2012 9:18 AM  Participation Quality:  DID NOT ATTEND   Smart, Ernest [name]

## 2012-11-20 NOTE — Clinical Social Work Note (Signed)
[facility] did not accept Pt. CSW sent referral to [facility] and [facility]. Currently waiting for these facilities to review pt information.

## 2012-11-20 NOTE — Progress Notes (Signed)
Date: 11/20/2012  Time: 10:00 AM  Group Topic/Focus:  Relapse Prevention Planning: The focus of this group is to define relapse and discuss the need for planning to combat relapse.  Participation Level: Did Not Attend  Participation Quality:  Affect:  Cognitive:  Insight:  Engagement in Group:  Modes of Intervention:  Additional Comments: Pt was in the bed sleeping, refused to attend.  Isla Pence M  11/20/2012, 4:12 PM

## 2012-11-20 NOTE — Progress Notes (Signed)
Date: 11/20/2012  Time: 11:00 AM  Group Topic/Focus:  Relapse Prevention Planning: The focus of this group is to define relapse and discuss the need for planning to combat relapse.  Participation Level: Did Not Attend  Participation Quality:  Affect:  Cognitive:  Insight:  Engagement in Group:  Modes of Intervention:  Additional Comments: Pt was in the bed sleeping, refused to attend.  Dalisha Shively M  11/20/2012, 4:12 PM  

## 2012-11-20 NOTE — Tx Team (Signed)
Interdisciplinary Treatment Plan Update (Adult)  Date: 11/20/2012   Time Reviewed: 10:26 AM  Progress in Treatment:  Attending groups: Intermittently Participating in groups: Minimally  Taking medication as prescribed: Yes  Tolerating medication: Yes  Family/Significant othe contact made: SPE completed with pt. He refused to consent to family contact.  Patient understands diagnosis: Yes AEB seeking treatment for mood stabilization, medication management, and referral to SNF.  Discussing patient identified problems/goals with staff: Yes  Medical problems stabilized or resolved: Yes  Denies suicidal/homicidal ideation: Yes. Self report.  Patient has not harmed self or Others: Yes  New problem(s) identified: Pt solely focused on getting d/ced and going to bank in order to straighten out social security check problem.  Discharge Plan or Barriers: Pt has PASRR # and Fl2 completed on 8/7. Pt referred to [facility]. Currently CSW waiting for pt to be accepted into facility.  Additional comments: Pt rating depression and anxiety high but presents with pleasant mood.  Reason for Continuation of Hospitalization: Mood stabilization Medication management Estimated length of stay:2-3 days (likely d/c Monday)  For review of initial/current patient goals, please see plan of care.  Attendees:  Patient:    Family:    Physician: Geoffery Lyons MD 11/20/2012 10:26 AM   Nursing: Philippa Chester RN 11/20/2012 10:26 AM   Clinical Social Worker [employer], LCSWA  11/20/2012 10:26 AM   Other: Thayer Ohm RN 11/20/2012 10:26 AM   Other: Aggie PA 11/20/2012 10:26 AM   Other: Massie Kluver, Community Care Coordinator  11/20/2012 10:26 AM   Other: Darden Dates Nurse CM 11/20/2012 10:26 AM   Scribe for Treatment Team:  Herbert Seta Smart LCSWA 11/20/2012 10:26 AM

## 2012-11-20 NOTE — Progress Notes (Signed)
Patient ID: Ernest [name], male   DOB: 1940-08-30, 72 y.o.   MRN: [id number] D: Took over patient's care @ 2330. Patient in bed sleeping. Respiration regular and unlabored. No sign of distress noted at this time A: 15 mins checks for safety. R: Patient is asleep. Pt is safe.

## 2012-11-20 NOTE — Progress Notes (Signed)
Adult Psychoeducational Group Note  Date:  11/20/2012 Time:  8:41 PM  Group Topic/Focus:  Goals Group:   The focus of this group is to help patients establish daily goals to achieve during treatment and discuss how the patient can incorporate goal setting into their daily lives to aide in recovery.  Participation Level:  Did Not Attend  Participation Quality:  didnt attend  Affect: didn't attend  Cognitive:  didnt attend  Insight: None  Engagement in Group:  didnt attend  Modes of Intervention:  didnt attend  Additional Comments:  Pt stated in bed  Aldona Lento 11/20/2012, 8:41 PM

## 2012-11-21 LAB — GLUCOSE, CAPILLARY: Glucose-Capillary: 132 mg/dL — ABNORMAL HIGH (ref 70–99)

## 2012-11-21 MED ORDER — IBUPROFEN 600 MG PO TABS
600.0000 mg | ORAL_TABLET | Freq: Four times a day (QID) | ORAL | Status: DC | PRN
  Administered 2012-11-22 – 2012-11-24 (×6): 600 mg via ORAL
  Filled 2012-11-21 (×6): qty 1

## 2012-11-21 MED ORDER — IBUPROFEN 600 MG PO TABS
ORAL_TABLET | ORAL | Status: AC
  Administered 2012-11-21: 600 mg via ORAL
  Filled 2012-11-21: qty 1

## 2012-11-21 NOTE — Progress Notes (Signed)
Patient ID: TOA MIA, male   DOB: [birth year], 72 y.o.   MRN: [id number]  D: Pt has been very flat and depressed on the unit today, and has also been very blunted and agitated. Pt has not engaged in any activity, and has not attended any groups. Pt uses a wheelchair due to decreased mobility, and has also been incontinent of urine. Pt reported been negative SI/HI, no HA/VH noted. A: 15 min checks continued for patient safety. R: Pts safety maintained.

## 2012-11-21 NOTE — BHH Group Notes (Addendum)
BHH Group Notes:  (Clinical Social Work)  11/21/2012     10-11AM  Summary of Progress/Problems:   The main focus of today's process group was for the patient to identify ways in which they have in the past sabotaged their own recovery. Motivational Interviewing was utilized to ask the group members what they get out of their substance use, and what reasons they may have for wanting to change.  The Stages of Change were explained using a handout, and patients identified where they currently are with regard to stages of change.  The patient expressed that he did not want to speak in group and alcohol is not a problem for him.  He talked later of being a [place] War veteran, and of being in [place] for over 40 years.  He talked at length about needing to avoid society today, about carrying a pistol and willingness to use it for self-protection against a home invasion.  He insisted that he is okay because he does not go to bars, will only drink when he is alone.  Type of Therapy:  Group Therapy - Process   Participation Level:  Minimal  Participation Quality:  Attentive  Affect:  Blunted  Cognitive:  Oriented  Insight:  Limited  Engagement in Therapy:  Limited  Modes of Intervention:  Education, Support and Processing, Motivational Interviewing  Ernest Mantle, LCSW 11/21/2012, 11:20 AM

## 2012-11-21 NOTE — Progress Notes (Signed)
Psychoeducational Group Note  Date:  11/21/2012 Time:  0945 am  Group Topic/Focus:  Identifying Needs:   The focus of this group is to help patients identify their personal needs that have been historically problematic and identify healthy behaviors to address their needs.  Participation Level:  Did Not Attend  Andrena Mews 11/21/2012,3:00 PM

## 2012-11-21 NOTE — Progress Notes (Signed)
Patient ID: Ernest [name], male   DOB: 1940-11-28, 72 y.o.   MRN: [id number] D: Patient lying in bed with eyes closed. Respirations even and non-labored. A: Staff will monitor on q 15 minute checks, follow treatment plan, and give meds as ordered. R: Appears to be sleeping

## 2012-11-21 NOTE — BHH Group Notes (Signed)
B[facility] Group Notes:  (Nursing/MHT/Case Management/Adjunct)  Date:  11/21/2012  Time:  1:23 PM  Type of Therapy:  Psychoeducational Skills  Participation Level:  Did Not Attend  Garvey Westcott Shanta 11/21/2012, 1:23 PM 

## 2012-11-21 NOTE — Progress Notes (Signed)
[facility] MD Progress Note  11/21/2012 4:08 PM Ernest [name]  MRN:  [id number] Subjective:  Ernest [name] endorses that he is still anxious, worried. He is hopeful to  be able to get into the assisted living facility that interviewed him. He is complaining of increased joint pain and asked for Ibuprofen. He is concerned that if he does not get a stable placement the cycle will start all over again Diagnosis:  Alcohol Dependence, PTSD, Mood Disorder NOS  ADL's:  Intact  Sleep: Fair  Appetite:  Fair  Suicidal Ideation:  Plan:  Denies Intent:  denies Means:  denies Homicidal Ideation:  Plan:  denies Intent:  denies Means:  denies AEB (as evidenced by):  Psychiatric Specialty Exam: Review of Systems  HENT: Negative.   Eyes: Negative.   Respiratory: Negative.   Cardiovascular: Negative.   Gastrointestinal: Negative.   Genitourinary: Negative.   Musculoskeletal: Positive for back pain and joint pain.  Skin: Negative.   Neurological: Positive for weakness.  Endo/Heme/Allergies: Negative.   Psychiatric/Behavioral: Positive for substance abuse. The patient is nervous/anxious.     Blood pressure 112/65, pulse 75, temperature 98.3 F (36.8 C), temperature source Oral, resp. rate 16, height 6' (1.829 m), weight 80.74 kg (178 lb).Body mass index is 24.14 kg/(m^2).  General Appearance: Disheveled  Eye Solicitor::  Fair  Speech:  Clear and Coherent  Volume:  fluctuates  Mood:  Anxious, Irritable and worried  Affect:  anxious, worried  Thought Process:  Coherent and Goal Directed  Orientation:  Full (Time, Place, and Person)  Thought Content:  worries, concerns  Suicidal Thoughts:  No  Homicidal Thoughts:  No  Memory:  Immediate;   Fair Recent;   Fair Remote;   Fair  Judgement:  Fair  Insight:  Shallow  Psychomotor Activity:  Restlessness  Concentration:  Fair  Recall:  Fair  Akathisia:  No  Handed:  Right  AIMS (if indicated):     Assets:  Desire for Improvement  Sleep:  Number  of Hours: 6.25   Current Medications: Current Facility-Administered Medications  Medication Dose Route Frequency Provider Last Rate Last Dose  . acetaminophen (TYLENOL) tablet 650 mg  650 mg Oral Q6H PRN Earney Navy, NP   650 mg at 11/21/12 0926  . alum & mag hydroxide-simeth (MAALOX/MYLANTA) 200-200-20 MG/5ML suspension 30 mL  30 mL Oral Q4H PRN Earney Navy, NP      . aspirin EC tablet 81 mg  81 mg Oral Daily Earney Navy, NP   81 mg at 11/21/12 0927  . feeding supplement (ENSURE COMPLETE) liquid 237 mL  237 mL Oral BID BM Jeoffrey Massed, RD   237 mL at 11/21/12 1400  . glipiZIDE (GLUCOTROL) tablet 10 mg  10 mg Oral BID AC Earney Navy, NP   10 mg at 11/21/12 7829  . hydrOXYzine (ATARAX/VISTARIL) tablet 25 mg  25 mg Oral Q6H PRN Kerry Hough, PA-C   25 mg at 11/16/12 2249  . ibuprofen (ADVIL,MOTRIN) tablet 600 mg  600 mg Oral Q6H PRN Rachael Fee, MD   600 mg at 11/21/12 1212  . insulin aspart (novoLOG) injection 0-15 Units  0-15 Units Subcutaneous TID WC Sanjuana Kava, NP      . magnesium hydroxide (MILK OF MAGNESIA) suspension 30 mL  30 mL Oral Daily PRN Earney Navy, NP      . metFORMIN (GLUCOPHAGE) tablet 500 mg  500 mg Oral BID WC Earney Navy, NP   500 mg  at 11/21/12 0927  . multivitamin with minerals tablet 1 tablet  1 tablet Oral Daily Jeoffrey Massed, RD   1 tablet at 11/21/12 [phone]  . MUSCLE RUB CREA   Topical PRN Rachael Fee, MD      . nicotine polacrilex (NICORETTE) gum 2 mg  2 mg Oral Q4H while awake Rachael Fee, MD   2 mg at 11/21/12 9604   And  . nicotine polacrilex (NICORETTE) gum 2 mg  2 mg Oral PRN Rachael Fee, MD   2 mg at 11/20/12 1711  . sertraline (ZOLOFT) tablet 50 mg  50 mg Oral Daily Sanjuana Kava, NP   50 mg at 11/21/12 5409  . traZODone (DESYREL) tablet 100 mg  100 mg Oral QHS Earney Navy, NP   100 mg at 11/20/12 2232    Lab Results:  Results for orders placed during the hospital encounter of 11/16/12 (from the  past 48 hour(s))  GLUCOSE, CAPILLARY     Status: Abnormal   Collection Time    11/19/12  5:08 PM      Result Value Range   Glucose-Capillary 195 (*) 70 - 99 mg/dL  GLUCOSE, CAPILLARY     Status: Abnormal   Collection Time    11/19/12 10:19 PM      Result Value Range   Glucose-Capillary 114 (*) 70 - 99 mg/dL   Comment 1 Notify RN     Comment 2 Documented in Chart    GLUCOSE, CAPILLARY     Status: Abnormal   Collection Time    11/20/12  6:17 AM      Result Value Range   Glucose-Capillary 189 (*) 70 - 99 mg/dL  GLUCOSE, CAPILLARY     Status: Abnormal   Collection Time    11/20/12 12:07 PM      Result Value Range   Glucose-Capillary 184 (*) 70 - 99 mg/dL  GLUCOSE, CAPILLARY     Status: Abnormal   Collection Time    11/20/12  4:54 PM      Result Value Range   Glucose-Capillary 150 (*) 70 - 99 mg/dL  GLUCOSE, CAPILLARY     Status: Abnormal   Collection Time    11/21/12  6:24 AM      Result Value Range   Glucose-Capillary 178 (*) 70 - 99 mg/dL  GLUCOSE, CAPILLARY     Status: Abnormal   Collection Time    11/21/12 11:41 AM      Result Value Range   Glucose-Capillary 132 (*) 70 - 99 mg/dL   Comment 1 Notify RN      Physical Findings: AIMS: Facial and Oral Movements Muscles of Facial Expression: None, normal Lips and Perioral Area: None, normal Jaw: None, normal Tongue: None, normal,Extremity Movements Upper (arms, wrists, hands, fingers): None, normal Lower (legs, knees, ankles, toes): None, normal, Trunk Movements Neck, shoulders, hips: None, normal, Overall Severity Severity of abnormal movements (highest score from questions above): None, normal Incapacitation due to abnormal movements: None, normal Patient's awareness of abnormal movements (rate only patient's report): No Awareness, Dental Status Current problems with teeth and/or dentures?: No Does patient usually wear dentures?: No  CIWA:  CIWA-Ar Total: 1 COWS:  COWS Total Score: 0  Treatment Plan  Summary: Daily contact with patient to assess and evaluate symptoms and progress in treatment Medication management  Plan: Supportive approach/coping skills/relapse prevention            Continue to optimize treatment  Motrin 800 mg Every 6 hours as needed  Medical Decision Making Problem Points:  Review of psycho-social stressors (1) Data Points:  Review of new medications or change in dosage (2)  I certify that inpatient services furnished can reasonably be expected to improve the patient's condition.   Maddax Palinkas A 11/21/2012, 4:08 PM

## 2012-11-22 MED ORDER — TRAZODONE HCL 100 MG PO TABS
100.0000 mg | ORAL_TABLET | Freq: Every day | ORAL | Status: DC
  Administered 2012-11-22 – 2012-11-25 (×4): 100 mg via ORAL
  Filled 2012-11-22 (×5): qty 1
  Filled 2012-11-22: qty 4

## 2012-11-22 NOTE — Progress Notes (Signed)
Psychoeducational Group Note  Date:  11/21/2012  Time:  2100    Group Topic/Focus:  wrap up group  Participation Level: Did Not Attend  Participation Quality:  Not Applicable  Affect:  Not Applicable  Cognitive:  Not Applicable  Insight:  Not Applicable  Engagement in Group: Not Applicable  Additional Comments:  Pt remained in bed during group.  Ernest [name] 11/21/2012, 2100

## 2012-11-22 NOTE — Progress Notes (Signed)
Adult Psychoeducational Group Note  Date:  11/22/2012 Time:  3:15PM  Group Topic/Focus:  Making Healthy Choices:   The focus of this group is to help patients identify negative/unhealthy choices they were using prior to admission and identify positive/healthier coping strategies to replace them upon discharge.  Participation Level:  Did Not Attend   Additional Comments:  Pt opted not to attend the group session.   Zacarias Pontes R 11/22/2012, 4:11 PM

## 2012-11-22 NOTE — BHH Group Notes (Signed)
[facility] Group Notes:  (Nursing/MHT/Case Management/Adjunct)  Date:  11/22/2012  Time:  10:27 AM  Type of Therapy: Psychoeducational Skills   Participation Level: Did Not Attend     Buford Dresser 11/22/2012, 10:27 AM

## 2012-11-22 NOTE — Progress Notes (Signed)
Patient ID: Ernest [name], male   DOB: 10/16/1940, 72 y.o.   MRN: [id number]  Patient asleep; no s/s of distress noted at this time. Respirations regular and unlabored. 

## 2012-11-22 NOTE — Progress Notes (Signed)
[facility] MD Progress Note  11/22/2012 12:49 PM Ernest [name]  MRN:  [id number] Subjective:  Ernest [name] endorses that he is getting the Trazodone too late so it is not hleping as much and he feels groggy in the morning. He is still having the difficulties with his back, his legs. He is hopeful that they are going to take him at the assisted living facility. He asked to be able to take the Trazodone early Diagnosis:  Alcohol Dependence, PTSD, Mood Disorder NOS  ADL's:  Intact  Sleep: Poor  Appetite:  Fair  Suicidal Ideation:  Plan:  denies Intent:  denies Means:  denies Homicidal Ideation:  Plan:  denies Intent:  denies Means:  denies AEB (as evidenced by):  Psychiatric Specialty Exam: Review of Systems  Constitutional: Negative.   HENT: Negative.   Eyes: Negative.   Respiratory: Negative.   Cardiovascular: Negative.   Gastrointestinal: Negative.   Genitourinary: Negative.   Musculoskeletal: Positive for myalgias, back pain and joint pain.  Skin: Negative.   Neurological: Negative.   Endo/Heme/Allergies: Negative.   Psychiatric/Behavioral: Positive for substance abuse. The patient is nervous/anxious.     Blood pressure 137/79, pulse 64, temperature 97.7 F (36.5 C), temperature source Oral, resp. rate 16, height 6' (1.829 m), weight 80.74 kg (178 lb).Body mass index is 24.14 kg/(m^2).  General Appearance: Disheveled  Eye Solicitor::  Fair  Speech:  Clear and Coherent  Volume:  fluctuates  Mood:  Anxious and worried  Affect:  Restricted  Thought Process:  Coherent and Goal Directed  Orientation:  Full (Time, Place, and Person)  Thought Content:  worries, concerns  Suicidal Thoughts:  No  Homicidal Thoughts:  No  Memory:  Immediate;   Fair Recent;   Fair Remote;   Fair  Judgement:  Fair  Insight:  Shallow  Psychomotor Activity:  Restlessness  Concentration:  Fair  Recall:  Fair  Akathisia:  No  Handed:  Right  AIMS (if indicated):     Assets:  Desire for  Improvement  Sleep:  Number of Hours: 5.25   Current Medications: Current Facility-Administered Medications  Medication Dose Route Frequency Provider Last Rate Last Dose  . acetaminophen (TYLENOL) tablet 650 mg  650 mg Oral Q6H PRN Earney Navy, NP   650 mg at 11/21/12 0926  . alum & mag hydroxide-simeth (MAALOX/MYLANTA) 200-200-20 MG/5ML suspension 30 mL  30 mL Oral Q4H PRN Earney Navy, NP      . aspirin EC tablet 81 mg  81 mg Oral Daily Earney Navy, NP   81 mg at 11/22/12 0804  . feeding supplement (ENSURE COMPLETE) liquid 237 mL  237 mL Oral BID BM Jeoffrey Massed, RD   237 mL at 11/22/12 1134  . glipiZIDE (GLUCOTROL) tablet 10 mg  10 mg Oral BID AC Earney Navy, NP   10 mg at 11/22/12 0618  . hydrOXYzine (ATARAX/VISTARIL) tablet 25 mg  25 mg Oral Q6H PRN Kerry Hough, PA-C   25 mg at 11/16/12 2249  . ibuprofen (ADVIL,MOTRIN) tablet 600 mg  600 mg Oral Q6H PRN Rachael Fee, MD   600 mg at 11/22/12 0804  . insulin aspart (novoLOG) injection 0-15 Units  0-15 Units Subcutaneous TID WC Sanjuana Kava, NP      . magnesium hydroxide (MILK OF MAGNESIA) suspension 30 mL  30 mL Oral Daily PRN Earney Navy, NP      . metFORMIN (GLUCOPHAGE) tablet 500 mg  500 mg Oral BID WC Julieanne Cotton  Doree Albee, NP   500 mg at 11/22/12 0804  . multivitamin with minerals tablet 1 tablet  1 tablet Oral Daily Jeoffrey Massed, RD   1 tablet at 11/22/12 0804  . MUSCLE RUB CREA   Topical PRN Rachael Fee, MD      . nicotine polacrilex (NICORETTE) gum 2 mg  2 mg Oral Q4H while awake Rachael Fee, MD   2 mg at 11/22/12 1134   And  . nicotine polacrilex (NICORETTE) gum 2 mg  2 mg Oral PRN Rachael Fee, MD   2 mg at 11/20/12 1711  . sertraline (ZOLOFT) tablet 50 mg  50 mg Oral Daily Sanjuana Kava, NP   50 mg at 11/22/12 0804  . traZODone (DESYREL) tablet 100 mg  100 mg Oral QHS Earney Navy, NP   100 mg at 11/22/12 0008    Lab Results:  Results for orders placed during the hospital  encounter of 11/16/12 (from the past 48 hour(s))  GLUCOSE, CAPILLARY     Status: Abnormal   Collection Time    11/20/12  4:54 PM      Result Value Range   Glucose-Capillary 150 (*) 70 - 99 mg/dL  GLUCOSE, CAPILLARY     Status: Abnormal   Collection Time    11/21/12  6:24 AM      Result Value Range   Glucose-Capillary 178 (*) 70 - 99 mg/dL  GLUCOSE, CAPILLARY     Status: Abnormal   Collection Time    11/21/12 11:41 AM      Result Value Range   Glucose-Capillary 132 (*) 70 - 99 mg/dL   Comment 1 Notify RN    GLUCOSE, CAPILLARY     Status: Abnormal   Collection Time    11/22/12  6:22 AM      Result Value Range   Glucose-Capillary 170 (*) 70 - 99 mg/dL    Physical Findings: AIMS: Facial and Oral Movements Muscles of Facial Expression: None, normal Lips and Perioral Area: None, normal Jaw: None, normal Tongue: None, normal,Extremity Movements Upper (arms, wrists, hands, fingers): None, normal Lower (legs, knees, ankles, toes): None, normal, Trunk Movements Neck, shoulders, hips: None, normal, Overall Severity Severity of abnormal movements (highest score from questions above): None, normal Incapacitation due to abnormal movements: None, normal Patient's awareness of abnormal movements (rate only patient's report): No Awareness, Dental Status Current problems with teeth and/or dentures?: No Does patient usually wear dentures?: No  CIWA:  CIWA-Ar Total: 0 COWS:  COWS Total Score: 0  Treatment Plan Summary: Daily contact with patient to assess and evaluate symptoms and progress in treatment  Plan: Supportive approach/coping skills/relapse prevention            Reassess and address the co morbidities            Change Trazodone to 1900  Medical Decision Making Problem Points:  Review of psycho-social stressors (1) Data Points:  Review of medication regiment & side effects (2)  I certify that inpatient services furnished can reasonably be expected to improve the patient's  condition.   Jashayla Glatfelter A 11/22/2012, 12:49 PM

## 2012-11-22 NOTE — Progress Notes (Signed)
Patient did not attend the evening speaker AA meeting. Pt remained in bed during meeting.   

## 2012-11-22 NOTE — Progress Notes (Signed)
Patient ID: Ernest [name], male   DOB: 09-17-1940, 72 y.o.   MRN: [id number]  D: Pt has been very flat and depressed on the unit, and has also been very isolative. Pt does not attend any groups and does not engage in any treatment. Pt only wakes up for medications, and is not interested in staffs feedback regarding attending groups and engaging in treatment. Pt reported being negative SI/HI, no AH/VH noted. A: 15 min checks continued for patient safety. R: Pts safety maintained.

## 2012-11-22 NOTE — Progress Notes (Signed)
Patient ID: Ernest [name], male   DOB: 01/17/1941, 72 y.o.   MRN: [id number]  Patient asleep; no s/s of distress noted at this time. Respirations regular and unlabored. 

## 2012-11-22 NOTE — [facility] Group Notes (Signed)
[facility] Group Notes: (Clinical Social Work)   11/22/2012      Type of Therapy:  Group Therapy   Participation Level:  Did Not Attend    Ambrose Mantle, LCSW 11/22/2012, 12:14 PM

## 2012-11-22 NOTE — [facility] Group Notes (Signed)
[facility] Group Notes:  (Nursing/MHT/Case Management/Adjunct)  Date:  11/22/2012  Time:  2:58 PM  Type of Therapy:  Nurse Education  Participation Level:  Did Not Attend  Participation Quality:    Affect:    Cognitive:    Insight:    Engagement in Group:    Modes of Intervention:    Summary of Progress/Problems:  Cresenciano Lick 11/22/2012, 2:58 PM

## 2012-11-23 LAB — GLUCOSE, CAPILLARY: Glucose-Capillary: 167 mg/dL — ABNORMAL HIGH (ref 70–99)

## 2012-11-23 MED ORDER — DOCUSATE SODIUM 100 MG PO CAPS
100.0000 mg | ORAL_CAPSULE | Freq: Two times a day (BID) | ORAL | Status: DC
  Administered 2012-11-23 – 2012-11-26 (×4): 100 mg via ORAL
  Filled 2012-11-23 (×8): qty 1

## 2012-11-23 MED ORDER — CALCIUM POLYCARBOPHIL 625 MG PO TABS
625.0000 mg | ORAL_TABLET | Freq: Every day | ORAL | Status: DC
  Administered 2012-11-23 – 2012-11-26 (×3): 625 mg via ORAL
  Filled 2012-11-23 (×5): qty 1

## 2012-11-23 NOTE — [facility] Group Notes (Signed)
[facility] LCSW Aftercare Discharge Planning Group Note   11/23/2012 10:17 AM  Participation Quality:  DID NOT ATTEND    Smart, Ernest [name]

## 2012-11-23 NOTE — Progress Notes (Signed)
[facility] MD Progress Note  11/23/2012 3:16 PM Ernest TRETTIN  MRN:  [id number] Subjective:  Ernest [name] is anxiously waiting for a decision on his placement. He is very worried about what is going to happen If he does not get a placement. He is still concerned about his constipation.  Diagnosis:  Alcohol Dependence, PTSD, Mood Disorder NOS  ADL's:  Intact  Sleep: Fair  Appetite:  Poor  Suicidal Ideation:  Plan:  denies Intent:  denies Means:  denies Homicidal Ideation:  Plan:  denies Intent:  denies Means:  denies AEB (as evidenced by):  Psychiatric Specialty Exam: Review of Systems  Constitutional: Negative.   HENT: Positive for neck pain.   Eyes: Negative.   Respiratory: Negative.   Cardiovascular: Negative.   Gastrointestinal: Positive for constipation.  Genitourinary: Negative.   Musculoskeletal: Positive for back pain.  Skin: Negative.   Neurological: Negative.   Endo/Heme/Allergies: Negative.   Psychiatric/Behavioral: Positive for depression and substance abuse. The patient is nervous/anxious.     Blood pressure 121/74, pulse 65, temperature 97.9 F (36.6 C), temperature source Oral, resp. rate 18, height 6' (1.829 m), weight 80.74 kg (178 lb).Body mass index is 24.14 kg/(m^2).  General Appearance: Disheveled  Eye Solicitor::  Fair  Speech:  Clear and Coherent  Volume:  fluctuates  Mood:  Anxious, Depressed and Irritable  Affect:  Restricted  Thought Process:  Coherent and Goal Directed  Orientation:  Full (Time, Place, and Person)  Thought Content:  worries, concerns  Suicidal Thoughts:  No  Homicidal Thoughts:  No  Memory:  Immediate;   Fair Recent;   Fair Remote;   Fair  Judgement:  Fair  Insight:  Present  Psychomotor Activity:  Restlessness  Concentration:  Fair  Recall:  Fair  Akathisia:  No  Handed:  Right  AIMS (if indicated):     Assets:  Desire for Improvement  Sleep:  Number of Hours: 5   Current Medications: Current Facility-Administered  Medications  Medication Dose Route Frequency Provider Last Rate Last Dose  . acetaminophen (TYLENOL) tablet 650 mg  650 mg Oral Q6H PRN Earney Navy, NP   650 mg at 11/21/12 0926  . alum & mag hydroxide-simeth (MAALOX/MYLANTA) 200-200-20 MG/5ML suspension 30 mL  30 mL Oral Q4H PRN Earney Navy, NP      . aspirin EC tablet 81 mg  81 mg Oral Daily Earney Navy, NP   81 mg at 11/23/12 0819  . docusate sodium (COLACE) capsule 100 mg  100 mg Oral BID Rachael Fee, MD   100 mg at 11/23/12 1237  . feeding supplement (ENSURE COMPLETE) liquid 237 mL  237 mL Oral BID BM Jeoffrey Massed, RD   237 mL at 11/23/12 1342  . glipiZIDE (GLUCOTROL) tablet 10 mg  10 mg Oral BID AC Earney Navy, NP   10 mg at 11/23/12 0606  . hydrOXYzine (ATARAX/VISTARIL) tablet 25 mg  25 mg Oral Q6H PRN Kerry Hough, PA-C   25 mg at 11/16/12 2249  . ibuprofen (ADVIL,MOTRIN) tablet 600 mg  600 mg Oral Q6H PRN Rachael Fee, MD   600 mg at 11/23/12 [phone]  . insulin aspart (novoLOG) injection 0-15 Units  0-15 Units Subcutaneous TID WC Sanjuana Kava, NP      . magnesium hydroxide (MILK OF MAGNESIA) suspension 30 mL  30 mL Oral Daily PRN Earney Navy, NP      . metFORMIN (GLUCOPHAGE) tablet 500 mg  500 mg Oral BID  WC Earney Navy, NP   500 mg at 11/23/12 0817  . multivitamin with minerals tablet 1 tablet  1 tablet Oral Daily Jeoffrey Massed, RD   1 tablet at 11/23/12 0818  . MUSCLE RUB CREA   Topical PRN Rachael Fee, MD   1 application at 11/23/12 1417  . nicotine polacrilex (NICORETTE) gum 2 mg  2 mg Oral Q4H while awake Rachael Fee, MD   2 mg at 11/23/12 1342   And  . nicotine polacrilex (NICORETTE) gum 2 mg  2 mg Oral PRN Rachael Fee, MD   2 mg at 11/20/12 1711  . polycarbophil (FIBERCON) tablet 625 mg  625 mg Oral Daily Rachael Fee, MD   625 mg at 11/23/12 1234  . sertraline (ZOLOFT) tablet 50 mg  50 mg Oral Daily Sanjuana Kava, NP   50 mg at 11/23/12 0818  . traZODone (DESYREL) tablet 100  mg  100 mg Oral QHS Rachael Fee, MD   100 mg at 11/22/12 1900    Lab Results:  Results for orders placed during the hospital encounter of 11/16/12 (from the past 48 hour(s))  GLUCOSE, CAPILLARY     Status: Abnormal   Collection Time    11/22/12  6:22 AM      Result Value Range   Glucose-Capillary 170 (*) 70 - 99 mg/dL    Physical Findings: AIMS: Facial and Oral Movements Muscles of Facial Expression: None, normal Lips and Perioral Area: None, normal Jaw: None, normal Tongue: None, normal,Extremity Movements Upper (arms, wrists, hands, fingers): None, normal Lower (legs, knees, ankles, toes): None, normal, Trunk Movements Neck, shoulders, hips: None, normal, Overall Severity Severity of abnormal movements (highest score from questions above): None, normal Incapacitation due to abnormal movements: None, normal Patient's awareness of abnormal movements (rate only patient's report): No Awareness, Dental Status Current problems with teeth and/or dentures?: No Does patient usually wear dentures?: No  CIWA:  CIWA-Ar Total: 0 COWS:  COWS Total Score: 0  Treatment Plan Summary: Daily contact with patient to assess and evaluate symptoms and progress in treatment Medication management  Plan: Supportive approach/coping skills/relapse prevention           Reassess and address the co mobidities           Address the constipation: Colace/[place] Medical Decision Making Problem Points:  Review of psycho-social stressors (1) Data Points:  Review of new medications or change in dosage (2)  I certify that inpatient services furnished can reasonably be expected to improve the patient's condition.   [name] A 11/23/2012, 3:16 PM

## 2012-11-23 NOTE — Progress Notes (Signed)
Patient ID: Ernest [name], male   DOB: 10-Feb-1941, 72 y.o.   MRN: [id number]  D: Pt denies SI/HI/AVH. Pt is pleasant and cooperative. Pt continues to irritated, but feels better that he is going to an assisted living facility. Pt still refusing to take insulin and sometimes get his blood sugar checked.   A: Pt was offered support and encouragement. Pt was given scheduled medications. Pt was encourage to attend groups. Q 15 minute checks were done for safety.   R: Pt is taking some  medication. Pt has no complaints at this time.Pt receptive to treatment and safety maintained on unit.

## 2012-11-23 NOTE — Care Management Utilization Note (Signed)
   Per State Regulation 482.30  This chart was reviewed for necessity with respect to the patient's Admission/ Duration of stay.  Next review date:  11/26/12  Kinya Meine Morrison RN, BSN 

## 2012-11-23 NOTE — [facility] Group Notes (Signed)
[facility] LCSW Group Therapy  11/23/2012 4:23 PM  Type of Therapy:  Group Therapy  Participation Level:  Did Not Attend   Smart, Cindy Brindisi 11/23/2012, 4:23 PM  

## 2012-11-23 NOTE — Progress Notes (Signed)
Pt has been in his bed or room all day.  He has rolled out of his room and sat in the hallway a few times today.  He is argu and cursing a lot today if things don't goes his way.  He denies any S/H ideation or A/V/H.  He has demanded ensure and milk all day.  He stated,"I don't give a damn I am not eating and all I want is my ensure and milk. Dietician consult and she told him he could only have 3 per day and he has already had 2 today.  The dietician has written out a plan with the cafeteria to get meals pt will and can eat.  He is looking to go into a ALF at discharge.

## 2012-11-23 NOTE — Progress Notes (Signed)
Didn't attend group 

## 2012-11-23 NOTE — Progress Notes (Signed)
Adult Psychoeducational Group Note  Date:  11/23/2012 Time:  11:00AM Group Topic/Focus:  Self Care:   The focus of this group is to help patients understand the importance of self-care in order to improve or restore emotional, physical, spiritual, interpersonal, and financial health.  Participation Level:  Did Not Attend   Additional Comments: Pt. Didn't attend group.   Bing Plume D 11/23/2012, 1:05 PM

## 2012-11-23 NOTE — Progress Notes (Signed)
Nutrition Consult.  Initial assessment 8/6.  Patient admitted with ETOH abuse. Currently waiting placement into an Assisted Living Facility.  Diet:   CHO MOD, tid snacks on unit.  Ensure Complete bid.  Spoke with RN.  Patient is refusing meals and only asking for Ensure and milk.  Blood sugars have been elevated and patient is refusing insulin.    Spoke with patient.  Patient states that he does not like going to the cafeteria in the wheel chair as there is not enough room.  New dentures and requires soft foods.  Dislikes some foods.  Patient is addiment about receiving what he wants.    Goal:  Patient to meet >/=90% of estimated nutrition needs-met.  Patient is meeting basic calorie and protein needs.  Receiving MVI  Intervention:  Encouraged patient to eat meals and snacks.  Preferences obtained for meals.  Continue Ensure Complete tid but would like patient to begin eating more solid foods.  Discussed patient's need for insulin.  Patient is resistant.  Ernest [name], RD, LDN Clinical Inpatient Dietitian Pager:  [phone] Weekend and after hours pager:  [phone]

## 2012-11-24 LAB — GLUCOSE, CAPILLARY: Glucose-Capillary: 186 mg/dL — ABNORMAL HIGH (ref 70–99)

## 2012-11-24 MED ORDER — FLEET ENEMA 7-19 GM/118ML RE ENEM
1.0000 | ENEMA | Freq: Once | RECTAL | Status: DC
  Filled 2012-11-24: qty 1

## 2012-11-24 MED ORDER — BISACODYL 10 MG RE SUPP
10.0000 mg | Freq: Once | RECTAL | Status: DC
  Filled 2012-11-24: qty 1

## 2012-11-24 MED ORDER — MAGNESIUM CITRATE PO SOLN
1.0000 | Freq: Once | ORAL | Status: AC
  Administered 2012-11-24: 1 via ORAL

## 2012-11-24 MED ORDER — BISACODYL 5 MG PO TBEC
5.0000 mg | DELAYED_RELEASE_TABLET | Freq: Every day | ORAL | Status: DC | PRN
  Administered 2012-11-24: 5 mg via ORAL
  Filled 2012-11-24: qty 1

## 2012-11-24 NOTE — Progress Notes (Signed)
Recreation Therapy Notes  Date: 08.12.2014 Time: 2:30pm Location:[address]  Group Topic: Animal Assisted Activities  Goal Area(s) Addresses:  Patient will interact appropriately with dog team.    Behavioral Response: Did not attend  Jearl Klinefelter, LRT/CTRS  Ting Cage L 11/24/2012 4:30 PM

## 2012-11-24 NOTE — Progress Notes (Signed)
D: Patient denies SI/HI and A/V hallucinations; patient declined self inventory; patient denies depression,anxiety, and hopelessness; patient denies any withdrawal symptoms; patient complaints of constipation and patient was ordered magnesium citrate  A: Monitored q 15 minutes; patient encouraged to attend groups; patient educated about medications; patient given medications per physician orders; patient encouraged to express feelings and/or concerns  R: Patient is demanding and assertive; patient is appropriate; patient's interaction with staff and peers is appropriate; patient is taking medications as prescribed and tolerating medications; patient is not attending  groups

## 2012-11-24 NOTE — [facility] Group Notes (Signed)
[facility] LCSW Group Therapy  11/24/2012 2:14 PM  Type of Therapy:  Group Therapy  Participation Level:  Did Not Attend   Smart, Herbert Seta 11/24/2012, 2:14 PM

## 2012-11-24 NOTE — Clinical Social Work Note (Signed)
New PASRR # posted this morning for pt: [id number]. PASRR faxed to [facility] and msg left for Janalyn Harder at this facility. She is currently out of the office and should be returning this afternoon to review pt's information.

## 2012-11-24 NOTE — Progress Notes (Signed)
Recreation Therapy Notes  Date: 08.12.2014 Time: 3:00pm Location: [address]   Group Topic: Problem Solving, Decision Making, Communication  Goal Area(s) Addresses:  Patient will effectively work together to accomplish a shared goal. Patient will verbalize skills needed to make activity successful.  Patient will verbalize ways to use skills used in group session post d/c.  Behavioral Response: Did not attend  Jearl Klinefelter, LRT/CTRS   Jearl Klinefelter 11/24/2012 4:02 PM

## 2012-11-24 NOTE — Progress Notes (Signed)
Patient did not attend 0900 hr RN group. 

## 2012-11-24 NOTE — Progress Notes (Signed)
[facility] MD Progress Note  11/24/2012 2:25 PM Ernest [name]  MRN:  [id number] Subjective: Orlo is worrying about not being able to go to the bathroom. He is also worrying about his placement. He is wanting some stability in his life. He is worried about not having that structure and relapsing and having to face the consequences of his relapse.  Diagnosis:  Alcohol Dependence, PTSD, Mood Disorder NOS  ADL's:  Intact  Sleep: Fair  Appetite:  Fair  Suicidal Ideation:  Plan:  denies Intent:  denies Means:  denies Homicidal Ideation:  Plan:  denies Intent:  denies Means:  denies AEB (as evidenced by):  Psychiatric Specialty Exam: Review of Systems  Gastrointestinal: Positive for constipation.  Psychiatric/Behavioral: Positive for substance abuse. The patient is nervous/anxious.     Blood pressure 122/76, pulse 65, temperature 97.6 F (36.4 C), temperature source Oral, resp. rate 22, height 6' (1.829 m), weight 80.74 kg (178 lb).Body mass index is 24.14 kg/(m^2).  General Appearance: Disheveled  Eye Solicitor::  Fair  Speech:  Clear and Coherent and Slow  Volume:  fluctuates  Mood:  worried  Affect:  worried  Thought Process:  Coherent and Goal Directed  Orientation:  Full (Time, Place, and Person)  Thought Content:  worries, concerns, somatically focused  Suicidal Thoughts:  No  Homicidal Thoughts:  No  Memory:  Immediate;   Fair Recent;   Fair Remote;   Fair  Judgement:  Fair  Insight:  Present  Psychomotor Activity:  Restlessness  Concentration:  Fair  Recall:  Fair  Akathisia:  No  Handed:  Right  AIMS (if indicated):     Assets:  Desire for Improvement  Sleep:  Number of Hours: 5   Current Medications: Current Facility-Administered Medications  Medication Dose Route Frequency Provider Last Rate Last Dose  . acetaminophen (TYLENOL) tablet 650 mg  650 mg Oral Q6H PRN Earney Navy, NP   650 mg at 11/21/12 0926  . alum & mag hydroxide-simeth  (MAALOX/MYLANTA) 200-200-20 MG/5ML suspension 30 mL  30 mL Oral Q4H PRN Earney Navy, NP      . aspirin EC tablet 81 mg  81 mg Oral Daily Earney Navy, NP   81 mg at 11/24/12 0741  . docusate sodium (COLACE) capsule 100 mg  100 mg Oral BID Rachael Fee, MD   100 mg at 11/24/12 8295  . feeding supplement (ENSURE COMPLETE) liquid 237 mL  237 mL Oral BID BM Jeoffrey Massed, RD   237 mL at 11/24/12 0959  . glipiZIDE (GLUCOTROL) tablet 10 mg  10 mg Oral BID AC Earney Navy, NP   10 mg at 11/24/12 6213  . hydrOXYzine (ATARAX/VISTARIL) tablet 25 mg  25 mg Oral Q6H PRN Kerry Hough, PA-C   25 mg at 11/16/12 2249  . ibuprofen (ADVIL,MOTRIN) tablet 600 mg  600 mg Oral Q6H PRN Rachael Fee, MD   600 mg at 11/24/12 0744  . insulin aspart (novoLOG) injection 0-15 Units  0-15 Units Subcutaneous TID WC Sanjuana Kava, NP      . magnesium hydroxide (MILK OF MAGNESIA) suspension 30 mL  30 mL Oral Daily PRN Earney Navy, NP      . metFORMIN (GLUCOPHAGE) tablet 500 mg  500 mg Oral BID WC Earney Navy, NP   500 mg at 11/24/12 0742  . multivitamin with minerals tablet 1 tablet  1 tablet Oral Daily Jeoffrey Massed, RD   1 tablet at 11/24/12  1610  . MUSCLE RUB CREA   Topical PRN Rachael Fee, MD      . nicotine polacrilex (NICORETTE) gum 2 mg  2 mg Oral Q4H while awake Rachael Fee, MD   2 mg at 11/24/12 1301   And  . nicotine polacrilex (NICORETTE) gum 2 mg  2 mg Oral PRN Rachael Fee, MD   2 mg at 11/23/12 1753  . polycarbophil (FIBERCON) tablet 625 mg  625 mg Oral Daily Rachael Fee, MD   625 mg at 11/24/12 0741  . sertraline (ZOLOFT) tablet 50 mg  50 mg Oral Daily Sanjuana Kava, NP   50 mg at 11/24/12 0742  . traZODone (DESYREL) tablet 100 mg  100 mg Oral QHS Rachael Fee, MD   100 mg at 11/23/12 1901    Lab Results:  Results for orders placed during the hospital encounter of 11/16/12 (from the past 48 hour(s))  GLUCOSE, CAPILLARY     Status: Abnormal   Collection Time     11/23/12 11:26 AM      Result Value Range   Glucose-Capillary 186 (*) 70 - 99 mg/dL   Comment 1 Notify RN    GLUCOSE, CAPILLARY     Status: Abnormal   Collection Time    11/23/12  5:17 PM      Result Value Range   Glucose-Capillary 167 (*) 70 - 99 mg/dL  GLUCOSE, CAPILLARY     Status: Abnormal   Collection Time    11/24/12 11:31 AM      Result Value Range   Glucose-Capillary 130 (*) 70 - 99 mg/dL    Physical Findings: AIMS: Facial and Oral Movements Muscles of Facial Expression: None, normal Lips and Perioral Area: None, normal Jaw: None, normal Tongue: None, normal,Extremity Movements Upper (arms, wrists, hands, fingers): None, normal Lower (legs, knees, ankles, toes): None, normal, Trunk Movements Neck, shoulders, hips: None, normal, Overall Severity Severity of abnormal movements (highest score from questions above): None, normal Incapacitation due to abnormal movements: None, normal Patient's awareness of abnormal movements (rate only patient's report): No Awareness, Dental Status Current problems with teeth and/or dentures?: No Does patient usually wear dentures?: No  CIWA:  CIWA-Ar Total: 0 COWS:  COWS Total Score: 0  Treatment Plan Summary: Daily contact with patient to assess and evaluate symptoms and progress in treatment Medication management  Plan: Supportive approach/coping skills/relapse prevention            Reassess and address the co morbidities             Address the constipation: magnesium citrate             Facilitate placement Medical Decision Making Problem Points:  Review of psycho-social stressors (1) Data Points:  Review of medication regiment & side effects (2)  I certify that inpatient services furnished can reasonably be expected to improve the patient's condition.   Sybrina Laning A 11/24/2012, 2:25 PM

## 2012-11-24 NOTE — Progress Notes (Signed)
Patient ID: Ernest [name], male   DOB: 02-Mar-1941, 72 y.o.   MRN: [id number] Patient currently asleep; no s/s of distress noted at this time. Respirations regular and unlabored.

## 2012-11-25 LAB — GLUCOSE, CAPILLARY
Glucose-Capillary: 179 mg/dL — ABNORMAL HIGH (ref 70–99)
Glucose-Capillary: 132 mg/dL — ABNORMAL HIGH (ref 70–99)

## 2012-11-25 NOTE — Progress Notes (Signed)
Adult Psychoeducational Group Note  Date:  11/25/2012 Time:  3:10 PM  Group Topic/Focus:  Personal Choices and Values:   The focus of this group is to help patients assess and explore the importance of values in their lives, how their values affect their decisions, how they express their values and what opposes their expression.  Participation Level:  Did Not Attend  Additional Comments:  Pt did not attend group, despite being prompted to do so by staff.  Reinaldo Raddle K 11/25/2012, 3:10 PM

## 2012-11-25 NOTE — Tx Team (Signed)
Interdisciplinary Treatment Plan Update (Adult)  Date: 11/25/2012   Time Reviewed: 10:21 AM  Progress in Treatment:  Attending groups: Intermittently Participating in groups: Minimally  Taking medication as prescribed: Yes  Tolerating medication: Yes  Family/Significant othe contact made: SPE completed with pt. He refused to consent to family contact.  Patient understands diagnosis: Yes AEB seeking treatment for mood stabilization, medication management, and referral to SNF.  Discussing patient identified problems/goals with staff: Yes  Medical problems stabilized or resolved: Yes  Denies suicidal/homicidal ideation: Yes. Self report.  Patient has not harmed self or Others: Yes  New problem(s) identified: n/a Discharge Plan or Barriers: Pt was evaluated and received new PASRR number. Referred to [facility] and [facility]. CSW waiting to hear back from these facilities. [facility] may be able to take pt for short term rehab.  Additional comments: Pt rating depression and anxiety high but presents with pleasant mood.  Reason for Continuation of Hospitalization: Medication management Estimated length of stay:1-2 days  For review of initial/current patient goals, please see plan of care.  Attendees:  Patient:    Family:    Physician:     Nursing: Brittney RN 11/25/2012 10:21 AM   Clinical Social Worker [employer], LCSWA  11/25/2012 10:21 AM   Other: Maury Dus  11/25/2012 10:21 AM   Other: Aggie PA 11/25/2012 10:21 AM   Other: Massie Kluver, Community Care Coordinator  11/25/2012 10:21 AM   Other:    Scribe for Treatment Team:  [employer] LCSWA 11/25/2012 10:21 AM

## 2012-11-25 NOTE — Clinical Social Work Note (Signed)
Alvino Chapel from [facility] called CSW. Pt accepted into facility. PA stated that pt can be d/ced tomorrow. No MD working on hall today and unable to arrange d/c today (per PA). Alvino Chapel made aware that pt will be ready for d/c at 11AM tomorrow. Information faxed to [facility].

## 2012-11-25 NOTE — Progress Notes (Signed)
Psychoeducational Group Note  Date:  11/25/2012 Time:  2000  Group Topic/Focus:  NA group  Participation Level: Did Not Attend  Participation Quality:  Not Applicable  Affect:  Not Applicable  Cognitive:  Not Applicable  Insight:  Not Applicable  Engagement in Group: Not Applicable  Additional Comments:  Pt was told that group was starting, but refused to attend.  Kaleen Odea R 11/25/2012, 8:54 PM

## 2012-11-25 NOTE — [facility] Group Notes (Signed)
[facility] LCSW Group Therapy  11/25/2012 2:49 PM  Type of Therapy:  Group Therapy  Participation Level:  Active  Participation Quality:  Attentive  Affect:  Appropriate  Cognitive:  Alert  Insight:  Improving  Engagement in Therapy:  Improving  Modes of Intervention:  Discussion, Education, Exploration, Socialization and Support  Summary of Progress/Problems: Emotion Regulation: This group focused on both positive and negative emotion identification and allowed group members to process ways to identify feelings, regulate negative emotions, and find healthy ways to manage internal/external emotions. Group members were asked to reflect on a time when their reaction to an emotion led to a negative outcome and explored how alternative responses using emotion regulation would have benefited them. Group members were also asked to discuss a time when emotion regulation was utilized when a negative emotion was experienced. Taeshawn came to group ten minutes before group ended. He was asked to identify an emotion that he struggles with. "I struggle with patience but I'm getting better at it." CSW offered praise and encouragement, noting that Donley patiently waited to find out about his SNF placement. Oris identified prayer, attending AA meetings, and improving his personal hygiene as goals/coping skills that he plans to practice after leaving [facility]. Cesario shows progress in the group setting AEB his attentiveness, participation, and ability to identify healthy coping skills to assist him with emotion regulation.    Smart, Julea Hutto 11/25/2012, 2:49 PM

## 2012-11-25 NOTE — Progress Notes (Signed)
Patient ID: Ernest [name], male   DOB: July 08, 1940, 72 y.o.   MRN: [id number]  D: Pt was more pleasant today than previous day. Informed the writer that he will be discharged tomorrow to an asst living facility. Stated he's thankful he was accepted. Pt continued to refuse to attend group, stating he had a problem with diarrhea earlier today.   A:  Support and encouragement was offered. 15 min checks continued for safety.  R: Pt remains safe.

## 2012-11-25 NOTE — Progress Notes (Signed)
Patient ID: Ernest [name], male   DOB: December 19, 1940, 72 y.o.   MRN: [id number] [facility] MD Progress Note  11/25/2012 4:34 PM Ernest [name]  MRN:  [id number]  Subjective: Ernest [name] reports, "I have been having diarrhea all morning long off and on. I'm still waiting to go to an assisted living place. I don't need to talk to you. I just talk to Dr. Dub Mikes".  Diagnosis:  Alcohol Dependence, PTSD, Mood Disorder NOS  ADL's:  Intact  Sleep: Fair  Appetite:  Fair  Suicidal Ideation:  Plan:  denies Intent:  denies Means:  denies Homicidal Ideation:  Plan:  denies Intent:  denies Means:  denies AEB (as evidenced by):  Psychiatric Specialty Exam: Review of Systems  Gastrointestinal: Positive for constipation.  Psychiatric/Behavioral: Positive for substance abuse. The patient is nervous/anxious.     Blood pressure 123/67, pulse 73, temperature 97.8 F (36.6 C), temperature source Oral, resp. rate 14, height 6' (1.829 m), weight 80.74 kg (178 lb).Body mass index is 24.14 kg/(m^2).  General Appearance: Disheveled  Eye Solicitor::  Fair  Speech:  Clear and Coherent and Slow  Volume:  fluctuates  Mood:  worried  Affect:  worried  Thought Process:  Coherent and Goal Directed  Orientation:  Full (Time, Place, and Person)  Thought Content:  worries, concerns, somatically focused  Suicidal Thoughts:  No  Homicidal Thoughts:  No  Memory:  Immediate;   Fair Recent;   Fair Remote;   Fair  Judgement:  Fair  Insight:  Present  Psychomotor Activity:  Restlessness  Concentration:  Fair  Recall:  Fair  Akathisia:  No  Handed:  Right  AIMS (if indicated):     Assets:  Desire for Improvement  Sleep:  Number of Hours: 5   Current Medications: Current Facility-Administered Medications  Medication Dose Route Frequency Provider Last Rate Last Dose  . acetaminophen (TYLENOL) tablet 650 mg  650 mg Oral Q6H PRN Earney Navy, NP   650 mg at 11/21/12 0926  . alum & mag hydroxide-simeth  (MAALOX/MYLANTA) 200-200-20 MG/5ML suspension 30 mL  30 mL Oral Q4H PRN Earney Navy, NP      . aspirin EC tablet 81 mg  81 mg Oral Daily Earney Navy, NP   81 mg at 11/25/12 0924  . bisacodyl (DULCOLAX) EC tablet 5 mg  5 mg Oral Daily PRN Sanjuana Kava, NP   5 mg at 11/24/12 1649  . docusate sodium (COLACE) capsule 100 mg  100 mg Oral BID Rachael Fee, MD   100 mg at 11/24/12 1650  . feeding supplement (ENSURE COMPLETE) liquid 237 mL  237 mL Oral BID BM Jeoffrey Massed, RD   237 mL at 11/25/12 1003  . glipiZIDE (GLUCOTROL) tablet 10 mg  10 mg Oral BID AC Earney Navy, NP   10 mg at 11/25/12 0653  . hydrOXYzine (ATARAX/VISTARIL) tablet 25 mg  25 mg Oral Q6H PRN Kerry Hough, PA-C   25 mg at 11/16/12 2249  . ibuprofen (ADVIL,MOTRIN) tablet 600 mg  600 mg Oral Q6H PRN Rachael Fee, MD   600 mg at 11/24/12 1549  . insulin aspart (novoLOG) injection 0-15 Units  0-15 Units Subcutaneous TID WC Sanjuana Kava, NP      . magnesium hydroxide (MILK OF MAGNESIA) suspension 30 mL  30 mL Oral Daily PRN Earney Navy, NP      . metFORMIN (GLUCOPHAGE) tablet 500 mg  500 mg Oral BID WC Josephine C  [place], NP   500 mg at 11/25/12 0927  . multivitamin with minerals tablet 1 tablet  1 tablet Oral Daily Jeoffrey Massed, RD   1 tablet at 11/25/12 0926  . MUSCLE RUB CREA   Topical PRN Rachael Fee, MD      . nicotine polacrilex (NICORETTE) gum 2 mg  2 mg Oral Q4H while awake Rachael Fee, MD   2 mg at 11/25/12 1340   And  . nicotine polacrilex (NICORETTE) gum 2 mg  2 mg Oral PRN Rachael Fee, MD   2 mg at 11/24/12 1651  . polycarbophil (FIBERCON) tablet 625 mg  625 mg Oral Daily Rachael Fee, MD   625 mg at 11/24/12 0741  . sertraline (ZOLOFT) tablet 50 mg  50 mg Oral Daily Sanjuana Kava, NP   50 mg at 11/25/12 0924  . sodium phosphate (FLEET) 7-19 GM/118ML enema 1 enema  1 enema Rectal Once Sanjuana Kava, NP      . traZODone (DESYREL) tablet 100 mg  100 mg Oral QHS Rachael Fee, MD   100  mg at 11/24/12 2209    Lab Results:  Results for orders placed during the hospital encounter of 11/16/12 (from the past 48 hour(s))  GLUCOSE, CAPILLARY     Status: Abnormal   Collection Time    11/23/12  5:17 PM      Result Value Range   Glucose-Capillary 167 (*) 70 - 99 mg/dL  GLUCOSE, CAPILLARY     Status: Abnormal   Collection Time    11/24/12 11:31 AM      Result Value Range   Glucose-Capillary 130 (*) 70 - 99 mg/dL  GLUCOSE, CAPILLARY     Status: Abnormal   Collection Time    11/24/12  4:55 PM      Result Value Range   Glucose-Capillary 111 (*) 70 - 99 mg/dL    Physical Findings: AIMS: Facial and Oral Movements Muscles of Facial Expression: None, normal Lips and Perioral Area: None, normal Jaw: None, normal Tongue: None, normal,Extremity Movements Upper (arms, wrists, hands, fingers): None, normal Lower (legs, knees, ankles, toes): None, normal, Trunk Movements Neck, shoulders, hips: None, normal, Overall Severity Severity of abnormal movements (highest score from questions above): None, normal Incapacitation due to abnormal movements: None, normal Patient's awareness of abnormal movements (rate only patient's report): No Awareness, Dental Status Current problems with teeth and/or dentures?: No Does patient usually wear dentures?: No  CIWA:  CIWA-Ar Total: 0 COWS:  COWS Total Score: 0  Treatment Plan Summary: Daily contact with patient to assess and evaluate symptoms and progress in treatment Medication management  Plan: Supportive approach/coping skills/relapse prevention Facilitate placement to an assisted living facility. Possible discharge in am. Explain to patient that his loose bowel is associated with constipation treatment received yesterday. Continue current treatment plan.  Medical Decision Making Problem Points:  Review of psycho-social stressors (1) Data Points:  Review of medication regiment & side effects (2)  I certify that inpatient services  furnished can reasonably be expected to improve the patient's condition.   Armandina Stammer I, PMHNP-BC 11/25/2012, 4:34 PM

## 2012-11-25 NOTE — Progress Notes (Signed)
Brief Nutrition Follow Up  Spoke to patient who reports diarrhea all morning.  States he is going to assisted living tomorrow.  Continues to drink Ensure and Milk and reports better eating.  Preferences obtained for dinner.  Will provide.  Oran Rein, RD, LDN Clinical Inpatient Dietitian Pager:  [phone] Weekend and after hours pager:  [phone]

## 2012-11-25 NOTE — [facility] Group Notes (Signed)
[facility] LCSW Aftercare Discharge Planning Group Note   11/25/2012 9:45 AM  Participation Quality:  DID NOT ATTEND    Smart, Herbert Seta

## 2012-11-25 NOTE — [facility] Group Notes (Signed)
Adult Psychoeducational Group Note  Date:  11/25/2012 Time:  4:24 AM  Group Topic/Focus:  Wellness Toolbox:   Participation Level:  Did Not Attend  Participation Quality:  None  Affect:  None  Cognitive:  None  Insight: None  Engagement in Group:  None  Modes of Intervention:  None  Additional Comments:  Patient did not attend group.  Delia Chimes 11/25/2012, 4:24 AM

## 2012-11-25 NOTE — Progress Notes (Signed)
Adult Psychoeducational Group Note  Date:  11/25/2012 Time:  10:00am Group Topic/Focus:  Therapetic Activity  Participation Level:  Did Not Attend  Participation Quality:   Affect:    Cognitive:    Insight:   Engagement in Group:    Modes of Intervention:    Additional Comments:  Pt did not attend group.  Shelly Bombard D 11/25/2012, 10:56 AM

## 2012-11-25 NOTE — Progress Notes (Signed)
D:Pt's mood is labile from demanding milk to saying "yes mam." Pt has asked for multiple milks during the shift. Writer explained to pt that milk is not the best option for someone with diabetes and offered a substitute. Pt became loud saying no "I want two white milks." Pt refuses to go to cafeteria and told writer that it takes to long when using his wheelchair. When pt was offered to go first in line, he refused saying that he has had diarrhea today.  Pt does report wanting to go to an assisted living facility and expresses sadness due to two of his roommates leaving while hear in the hospital. Pt reports passive si and talked about being a veteran. A:Offered support, encouragement and 15 minute checks. R:Pt contracts with staff for safety. Safety maintained on the unit.

## 2012-11-25 NOTE — Progress Notes (Signed)
Patient ID: Ernest [name], male   DOB: 1941/01/23, 72 y.o.   MRN: [id number]  D: Pt was laying in the bed during the assessment. Pt was slightly irritated and was reluctant to answer most questions. However, pt did inform the writer of some circumstances surrounding his adm. Stated that he'd come to [facility] but wouldn't say why, except to say he was trying to get back to [place]. However, later pt stated he doesn't want to go back to [place]. Stated he hopes to get placed in [place].  A: Encouragement and support was offered. 15 min checks for safety.  R: Pt remains safe.

## 2012-11-26 LAB — GLUCOSE, CAPILLARY: Glucose-Capillary: 183 mg/dL — ABNORMAL HIGH (ref 70–99)

## 2012-11-26 MED ORDER — DSS 100 MG PO CAPS: 100.0000 mg | ORAL_CAPSULE | Freq: Two times a day (BID) | ORAL | Status: DC

## 2012-11-26 MED ORDER — GLIPIZIDE 10 MG PO TABS: 10.0000 mg | ORAL_TABLET | Freq: Two times a day (BID) | ORAL | Status: DC

## 2012-11-26 MED ORDER — TRAZODONE HCL 100 MG PO TABS: 100.0000 mg | ORAL_TABLET | Freq: Every day | ORAL | Status: DC

## 2012-11-26 MED ORDER — CALCIUM POLYCARBOPHIL 625 MG PO TABS: 625.0000 mg | ORAL_TABLET | Freq: Every day | ORAL | Status: DC

## 2012-11-26 MED ORDER — MUSCLE RUB 10-15 % EX CREA: 1.0000 "application " | TOPICAL_CREAM | CUTANEOUS | Status: DC | PRN

## 2012-11-26 MED ORDER — HYDROXYZINE HCL 25 MG PO TABS: 25.0000 mg | ORAL_TABLET | Freq: Four times a day (QID) | ORAL | Status: DC | PRN

## 2012-11-26 MED ORDER — SERTRALINE HCL 50 MG PO TABS: 50.0000 mg | ORAL_TABLET | Freq: Every day | ORAL | Status: DC

## 2012-11-26 MED ORDER — METFORMIN HCL 500 MG PO TABS: 500.0000 mg | ORAL_TABLET | Freq: Two times a day (BID) | ORAL | Status: DC

## 2012-11-26 MED ORDER — HYPROMELLOSE (GONIOSCOPIC) 2.5 % OP SOLN: 1.0000 [drp] | Freq: Four times a day (QID) | OPHTHALMIC | Status: DC | PRN

## 2012-11-26 MED ORDER — ASPIRIN 81 MG PO TABS: 81.0000 mg | ORAL_TABLET | Freq: Every day | ORAL | Status: AC
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
## 2012-11-26 NOTE — [facility] Suicide Risk Assessment (Signed)
Suicide Risk Assessment  Discharge Assessment     Demographic Factors:  Male, Age 72 or older, Caucasian and Low socioeconomic status  Mental Status Per Nursing Assessment::   On Admission:  NA  Current Mental Status by Physician: Patient is calm and cooperative. Patient stated he is waiting for transport patient taken to the assisted living facility. Patient has fine mood with a constricted affect. He is normal speech and thought process. Patient has no suicidal or homicidal ideation intentions or plans. Patient has no evidence of psychosis  Loss Factors: Financial problems/change in socioeconomic status  Historical Factors: Impulsivity  Risk Reduction Factors:   Sense of responsibility to family, Religious beliefs about death, Living with another person, especially a relative, Positive social support, Positive therapeutic relationship and Positive coping skills or problem solving skills  Continued Clinical Symptoms:  Severe Anxiety and/or Agitation Alcohol/Substance Abuse/Dependencies  Cognitive Features That Contribute To Risk:  Polarized thinking    Suicide Risk:  Minimal: No identifiable suicidal ideation.  Patients presenting with no risk factors but with morbid ruminations; may be classified as minimal risk based on the severity of the depressive symptoms  Discharge Diagnoses:   AXIS I:  Post Traumatic Stress Disorder, Substance Induced Mood Disorder and Alcohol dependence AXIS II:  Deferred AXIS III:   Past Medical History  Diagnosis Date  . Diabetes mellitus   . Hypertension   . Neuropathy   . Mental disorder   . Depression   . Anxiety    AXIS IV:  other psychosocial or environmental problems, problems related to social environment and problems with primary support group AXIS V:  51-60 moderate symptoms  Plan Of Care/Follow-up recommendations:  Activity:  As tolerated Diet:  Regular  Is patient on multiple antipsychotic therapies at discharge:  No   Has  Patient had three or more failed trials of antipsychotic monotherapy by history:  No  Recommended Plan for Multiple Antipsychotic Therapies: Not applicable  Nehemiah Settle., M.D. 11/26/2012, 11:23 AM

## 2012-11-26 NOTE — Progress Notes (Signed)
[facility] Adult Case Management Discharge Plan :  Will you be returning to the same living situation after discharge: No.Pt going to [facility] (SNF for Rehab) At discharge, do you have transportation home?:Yes,  [facility] Do you have the ability to pay for your medications:Yes,  Medicare and medicaid  Release of information consent forms completed and in the chart;  Patient's signature needed at discharge.  Patient to Follow up at: Follow-up Information   Follow up with [facility] On 11/26/2012. [facility] will arrange for patient transport for 11:00AM. Pt accepted into short term rehab program at this facility.                       )    Contact information:   [address] [place], [place] [address] phone: [phone] fax: [phone]      Patient denies SI/HI:   Yes,  during group/self report    Safety Planning and Suicide Prevention discussed:  Yes,  pt refused to consent to family contact. SPE completed with [name] and he was given SPI pamphet. Pt was encouraged to ask questions and talk about concerns relating to SPE.  Smart, Chrishawna Farina 11/26/2012, 9:38 AM
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
## 2012-11-30 NOTE — Progress Notes (Signed)
Patient Discharge Instructions:  After Visit Summary (AVS):   Faxed to:  11/30/12 Discharge Summary Note:   Faxed to:  11/30/12 Psychiatric Admission Assessment Note:   Faxed to:  11/30/12 Suicide Risk Assessment - Discharge Assessment:   Faxed to:  11/30/12    Tonna Corner, 11/30/2012, 1:00 PM  Faxed to [facility] @ [phone]

## 2012-11-30 NOTE — Progress Notes (Signed)
Patient Discharge Instructions:  After Visit Summary (AVS):   Faxed to:  11/30/12 Discharge Summary Note:   Faxed to:  11/30/12 Psychiatric Admission Assessment Note:   Faxed to:  11/30/12 Suicide Risk Assessment - Discharge Assessment:   Faxed to:  11/30/12  Tonna Corner, 11/30/2012, 11:54 AM  Faxed to [facility] at [phone]

## 2012-11-30 NOTE — Progress Notes (Signed)
Patient Discharge Instructions:  After Visit Summary (AVS):   Faxed to:  11/30/12 Discharge Summary Note:   Faxed to:  11/30/12 Psychiatric Admission Assessment Note:   Faxed to:  11/30/12 Suicide Risk Assessment - Discharge Assessment:   Faxed to:  11/30/12  Tonna Corner, 11/30/2012, 12:01 PM  Faxed to [facility] at [phone]

## 2013-03-25 ENCOUNTER — Encounter (HOSPITAL_COMMUNITY): Payer: Self-pay | Admitting: Emergency Medicine

## 2013-03-25 ENCOUNTER — Emergency Department (HOSPITAL_COMMUNITY)
Admission: EM | Admit: 2013-03-25 | Discharge: 2013-03-25 | Disposition: A | Discharge status: Home / Self Care | Payer: Medicare Other | Attending: Emergency Medicine | Admitting: Emergency Medicine

## 2013-03-25 DIAGNOSIS — F3289 Other specified depressive episodes: Secondary | ICD-10-CM | POA: Insufficient documentation

## 2013-03-25 DIAGNOSIS — F411 Generalized anxiety disorder: Secondary | ICD-10-CM | POA: Insufficient documentation

## 2013-03-25 DIAGNOSIS — Z7982 Long term (current) use of aspirin: Secondary | ICD-10-CM | POA: Insufficient documentation

## 2013-03-25 DIAGNOSIS — Z043 Encounter for examination and observation following other accident: Secondary | ICD-10-CM | POA: Insufficient documentation

## 2013-03-25 DIAGNOSIS — Y929 Unspecified place or not applicable: Secondary | ICD-10-CM | POA: Insufficient documentation

## 2013-03-25 DIAGNOSIS — F329 Major depressive disorder, single episode, unspecified: Secondary | ICD-10-CM | POA: Insufficient documentation

## 2013-03-25 DIAGNOSIS — R296 Repeated falls: Secondary | ICD-10-CM | POA: Insufficient documentation

## 2013-03-25 DIAGNOSIS — F17200 Nicotine dependence, unspecified, uncomplicated: Secondary | ICD-10-CM | POA: Insufficient documentation

## 2013-03-25 DIAGNOSIS — E119 Type 2 diabetes mellitus without complications: Secondary | ICD-10-CM | POA: Insufficient documentation

## 2013-03-25 DIAGNOSIS — Y939 Activity, unspecified: Secondary | ICD-10-CM | POA: Insufficient documentation

## 2013-03-25 DIAGNOSIS — W19XXXA Unspecified fall, initial encounter: Secondary | ICD-10-CM

## 2013-03-25 DIAGNOSIS — Z79899 Other long term (current) drug therapy: Secondary | ICD-10-CM | POA: Insufficient documentation

## 2013-03-25 DIAGNOSIS — Z8669 Personal history of other diseases of the nervous system and sense organs: Secondary | ICD-10-CM | POA: Insufficient documentation

## 2013-03-25 DIAGNOSIS — I10 Essential (primary) hypertension: Secondary | ICD-10-CM | POA: Insufficient documentation
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
## 2013-03-25 NOTE — ED Provider Notes (Signed)
CSN: [id number]     Arrival date & time 03/25/13  1626 History   First MD Initiated Contact with Patient 03/25/13 1638     Chief Complaint  Patient presents with  . Fall   (Consider location/radiation/quality/duration/timing/severity/associated sxs/prior Treatment) HPI Pt presents from his nursing facility requesting to be transferred to [facility].  Per EMS/patient report he called police to come to the facility because he has multiple complaints about his care there- he states that he has doctors at the [facility] that he would like to see.  Apparently police reported to the facility then called EMS and then patient mentioned that he had fallen.  He had all his bags packed and requested that EMS take him to [facility].  Pt states he was told by "everyone" that once he came to the ED he could be transferred to the [facility] [place].  When asked specifically about areas of pain from the fall he has no complaints.  Did not strike head.  No neck or back pain. Pts primary reason for coming to the ED is that he wants to have transportation to the [facility].  There are no other associated systemic symptoms, there are no other alleviating or modifying factors.   Past Medical History  Diagnosis Date  . Diabetes mellitus   . Hypertension   . Neuropathy   . Mental disorder   . Depression   . Anxiety    History reviewed. No pertinent past surgical history. No family history on file. History  Substance Use Topics  . Smoking status: Current Every Day Smoker -- 2.00 packs/day    Types: Cigarettes  . Smokeless tobacco: Never Used  . Alcohol Use: 2.4 oz/week    4 Cans of beer per week    Review of Systems ROS reviewed and all otherwise negative except for mentioned in HPI  Allergies  Nicoderm  Home Medications   Current Outpatient Rx  Name  Route  Sig  Dispense  Refill  . aspirin 81 MG tablet   Oral   Take 1 tablet (81 mg total) by mouth daily. For heart health   30 tablet      .  folic acid (FOLVITE) 1 MG tablet   Oral   Take 1 mg by mouth daily.         Marland Kitchen gabapentin (NEURONTIN) 300 MG capsule   Oral   Take 300 mg by mouth 2 (two) times daily.         Marland Kitchen glipiZIDE (GLUCOTROL) 10 MG tablet   Oral   Take 10 mg by mouth daily before breakfast.         . lisinopril (PRINIVIL,ZESTRIL) 5 MG tablet   Oral   Take 5 mg by mouth daily.         . metFORMIN (GLUCOPHAGE) 1000 MG tablet   Oral   Take 1,000 mg by mouth 2 (two) times daily with a meal.         . oxyCODONE-acetaminophen (PERCOCET/ROXICET) 5-325 MG per tablet   Oral   Take 1 tablet by mouth every 8 (eight) hours as needed for severe pain.         . polycarbophil (FIBERCON) 625 MG tablet   Oral   Take 625 mg by mouth daily.         Marland Kitchen senna (SENOKOT) 8.6 MG TABS tablet   Oral   Take 1 tablet by mouth 2 (two) times daily.         Marland Kitchen  sertraline (ZOLOFT) 50 MG tablet   Oral   Take 1 tablet (50 mg total) by mouth daily. For depression   30 tablet   0   . thiamine (VITAMIN B-1) 100 MG tablet   Oral   Take 100 mg by mouth daily.         . traZODone (DESYREL) 100 MG tablet   Oral   Take 100 mg by mouth at bedtime as needed for sleep. For sleep          BP 145/81  Pulse 66  Temp(Src) 98.3 F (36.8 C) (Oral)  Resp 20  SpO2 95% Vitals reviewed Physical Exam Physical Examination: General appearance - alert, well appearing, and in no distress Mental status - alert, oriented to person, place, and time Eyes - pupils equal and reactive, extraocular eye movements intact Mouth - mucous membranes moist, pharynx normal without lesions Chest - clear to auscultation, no wheezes, rales or rhonchi, symmetric air entry Heart - normal rate, regular rhythm, normal S1, S2, no murmurs, rubs, clicks or gallops Abdomen - soft, nontender, nondistended, no masses or organomegaly Neurological - alert, oriented, normal speech, moving all extremities Extremities - peripheral pulses normal, no pedal  edema, no clubbing or cyanosis Skin - normal coloration and turgor, no rashes Psych- cooperative, but anxious and shouting intermittently  ED Course  Procedures (including critical care time)  5:46 PM d/w case management- she will see patient, review the situation and assist with dispo and/or call social work if needed  7:20 PM case management has talked with nursing facility and with patient.  He is cleared to go back to his nursing facility. He has been advised that we cannot arrange for transport to [place].  He has been given phone numbers and encouraged to arrange for transfer to [place] if he still wishes to do so from his nursing facility.  Labs Review Labs Reviewed - No data to display Imaging Review No results found.  EKG Interpretation   None       MDM   1. Fall, initial encounter    Pt presenting with request for transport to [place].  He is unhappy with his current nursing facility and wants to be transferred.  He did mention a fall to EMS.  However on exam today he has no findings of injury and has no complaints of pain.  He is chronically wheelchair bound- there appears to be no medical indication for further workup.  He states he was told that if he came to the ED that we would arrange for him to be transferred to [place].  D/w case management    Ethelda Chick, MD 03/25/13 2148

## 2013-03-25 NOTE — ED Notes (Signed)
Bed: WA17 Expected date:  Expected time:  Means of arrival:  Comments: EMS fall 

## 2013-03-25 NOTE — ED Notes (Signed)
Per EMS: Pt from rest home, police were at site. Pt was supposively being kicked out of rest home due to issues with bill. After pt found out about situation he stated that he fell yesterday and today c/o pain all over. Had all of personal items packed. Pt is wheelchair bound.Pt wants to go to [place] and was told he had to come to hospital first.

## 2013-03-25 NOTE — ED Notes (Signed)
Patient up for discharge [facility] called and made aware of need for transport

## 2013-04-20 ENCOUNTER — Encounter (HOSPITAL_COMMUNITY): Payer: Self-pay | Admitting: Emergency Medicine

## 2013-04-20 ENCOUNTER — Emergency Department (HOSPITAL_COMMUNITY): Payer: Medicare Other

## 2013-04-20 ENCOUNTER — Emergency Department (HOSPITAL_COMMUNITY)
Admission: EM | Admit: 2013-04-20 | Discharge: 2013-04-21 | Disposition: A | Discharge status: Skilled Nursing Facility | Payer: Medicare Other | Attending: Emergency Medicine | Admitting: Emergency Medicine

## 2013-04-20 DIAGNOSIS — R45851 Suicidal ideations: Secondary | ICD-10-CM | POA: Insufficient documentation

## 2013-04-20 DIAGNOSIS — Y939 Activity, unspecified: Secondary | ICD-10-CM | POA: Insufficient documentation

## 2013-04-20 DIAGNOSIS — Y9210 Unspecified residential institution as the place of occurrence of the external cause: Secondary | ICD-10-CM | POA: Insufficient documentation

## 2013-04-20 DIAGNOSIS — F411 Generalized anxiety disorder: Secondary | ICD-10-CM | POA: Insufficient documentation

## 2013-04-20 DIAGNOSIS — T07XXXA Unspecified multiple injuries, initial encounter: Secondary | ICD-10-CM | POA: Insufficient documentation

## 2013-04-20 DIAGNOSIS — F17200 Nicotine dependence, unspecified, uncomplicated: Secondary | ICD-10-CM | POA: Insufficient documentation

## 2013-04-20 DIAGNOSIS — Z7982 Long term (current) use of aspirin: Secondary | ICD-10-CM | POA: Insufficient documentation

## 2013-04-20 DIAGNOSIS — W19XXXA Unspecified fall, initial encounter: Secondary | ICD-10-CM | POA: Insufficient documentation

## 2013-04-20 DIAGNOSIS — I10 Essential (primary) hypertension: Secondary | ICD-10-CM | POA: Insufficient documentation

## 2013-04-20 DIAGNOSIS — E119 Type 2 diabetes mellitus without complications: Secondary | ICD-10-CM | POA: Insufficient documentation

## 2013-04-20 DIAGNOSIS — G589 Mononeuropathy, unspecified: Secondary | ICD-10-CM | POA: Insufficient documentation

## 2013-04-20 DIAGNOSIS — F329 Major depressive disorder, single episode, unspecified: Secondary | ICD-10-CM | POA: Insufficient documentation

## 2013-04-20 DIAGNOSIS — F3289 Other specified depressive episodes: Secondary | ICD-10-CM | POA: Insufficient documentation

## 2013-04-20 DIAGNOSIS — Z79899 Other long term (current) drug therapy: Secondary | ICD-10-CM | POA: Insufficient documentation

## 2013-04-20 LAB — URINALYSIS, ROUTINE W REFLEX MICROSCOPIC
Bilirubin Urine: NEGATIVE
GLUCOSE, UA: 250 mg/dL — AB
Hgb urine dipstick: NEGATIVE
KETONES UR: NEGATIVE mg/dL
LEUKOCYTES UA: NEGATIVE
Nitrite: NEGATIVE
Protein, ur: NEGATIVE mg/dL
SPECIFIC GRAVITY, URINE: 1.009 (ref 1.005–1.030)
Urobilinogen, UA: 0.2 mg/dL (ref 0.0–1.0)
pH: 6 (ref 5.0–8.0)

## 2013-04-20 LAB — ETHANOL

## 2013-04-20 LAB — CBC
HEMATOCRIT: 38.3 % — AB (ref 39.0–52.0)
HEMOGLOBIN: 12.7 g/dL — AB (ref 13.0–17.0)
MCH: 26 pg (ref 26.0–34.0)
MCHC: 33.2 g/dL (ref 30.0–36.0)
MCV: 78.3 fL (ref 78.0–100.0)
Platelets: 312 10*3/uL (ref 150–400)
RBC: 4.89 MIL/uL (ref 4.22–5.81)
RDW: 17.7 % — AB (ref 11.5–15.5)
WBC: 11.7 10*3/uL — ABNORMAL HIGH (ref 4.0–10.5)

## 2013-04-20 LAB — BASIC METABOLIC PANEL
BUN: 11 mg/dL (ref 6–23)
CALCIUM: 9.9 mg/dL (ref 8.4–10.5)
CO2: 22 mEq/L (ref 19–32)
Chloride: 93 mEq/L — ABNORMAL LOW (ref 96–112)
Creatinine, Ser: 0.76 mg/dL (ref 0.50–1.35)
GFR, EST NON AFRICAN AMERICAN: 88 mL/min — AB (ref >90)
GLUCOSE: 138 mg/dL — AB (ref 70–99)
POTASSIUM: 4.2 meq/L (ref 3.7–5.3)
SODIUM: 129 meq/L — AB (ref 137–147)

## 2013-04-20 LAB — RAPID URINE DRUG SCREEN, HOSP PERFORMED
Amphetamines: NOT DETECTED
Barbiturates: NOT DETECTED
Benzodiazepines: NOT DETECTED
Cocaine: NOT DETECTED
OPIATES: NOT DETECTED
Tetrahydrocannabinol: NOT DETECTED

## 2013-04-20 LAB — ACETAMINOPHEN LEVEL

## 2013-04-20 LAB — SALICYLATE LEVEL: Salicylate Lvl: <2 mg/dL — ABNORMAL LOW (ref 2.8–20.0)

## 2013-04-20 MED ORDER — ACETAMINOPHEN 325 MG PO TABS
650.0000 mg | ORAL_TABLET | Freq: Once | ORAL | Status: DC
  Filled 2013-04-20: qty 2

## 2013-04-20 NOTE — ED Notes (Signed)
Notified Dr. Gwendolyn GrantWalden that pt was refusing to have any more blood work drawn.

## 2013-04-20 NOTE — ED Notes (Signed)
Patient transported to X-ray 

## 2013-04-20 NOTE — Progress Notes (Signed)
   CARE MANAGEMENT ED NOTE 04/20/2013  Patient:  Ernest [name],Ernest [name]   Account Number:  [id number]  Date Initiated:  04/20/2013  Documentation initiated by:  Radford PaxFERRERO,Britne Borelli  Subjective/Objective Assessment:   Patient presents to ED post fall as per patient.  [facility] [facility] [place]     Subjective/Objective Assessment Detail:     Action/Plan:   Action/Plan Detail:   Anticipated DC Date:  04/20/2013     Status Recommendation to Physician:   Result of Recommendation:    Other ED Services  Consult Working Plan    DC Planning Services  CM consult  Other    Choice offered to / List presented to:            Status of service:  Completed, signed off  ED Comments:   ED Comments Detail:  [facility] by EDP regarding patient not wanting to [facility].  [place]  [facility] for same reason, [place].  Provided EDP with on call social worker phone number.  No furhter CM needs at this time.

## 2013-04-20 NOTE — ED Notes (Signed)
Unable to obtain urine sample at this time. Pt used the bathroom previously in depends.

## 2013-04-20 NOTE — ED Notes (Signed)
Patient refused blood draw for labs. RN made aware 

## 2013-04-20 NOTE — ED Notes (Signed)
Per EMS: [facility], pt called 911 stating he'd fallen, staff states he has not fallen and he does call out for falls all the time. Pt c/o pain all over, and states he does noy want to go back to the home and [facility].

## 2013-04-21 NOTE — Discharge Instructions (Signed)
You were seen today and evaluated for suicidal thoughts. You do not meet criteria for inpatient management. Resources will be provided for outpatient services. Please return if he has recurrence of thoughts, increasing depression, or thoughts of self-harm behavior.  [facility] [address] [place], [place] [phone]   Emergency Department Resource Guide 1) Find a Doctor and Pay Out of Pocket Although you won't have to find out who is covered by your insurance plan, it is a good idea to ask around and get recommendations. You will then need to call the office and see if the doctor you have chosen will accept you as a new patient and what types of options they offer for patients who are self-pay. Some doctors offer discounts or will set up payment plans for their patients who do not have insurance, but you will need to ask so you aren't surprised when you get to your appointment.  2) Contact Your Local Health Department Not all health departments have doctors that can see patients for sick visits, but many do, so it is worth a call to see if yours does. If you don't know where your local health department is, you can check in your phone book. The CDC also has a tool to help you locate your state's health department, and many state websites also have listings of all of their local health departments.  3) Find a Walk-in Clinic If your illness is not likely to be very severe or complicated, you may want to try a walk in clinic. These are popping up all over the country in pharmacies, drugstores, and shopping centers. They're usually staffed by nurse practitioners or physician assistants that have been trained to treat common illnesses and complaints. They're usually fairly quick and inexpensive. However, if you have serious medical issues or chronic medical problems, these are probably not your best option.  No Primary Care Doctor: - Call Health  Connect at  [phone] - they can help you locate a primary care doctor that  accepts your insurance, provides certain services, etc. - Physician Referral Service- [phone]  Chronic Pain Problems: Organization         Address  Phone   Notes  [facility]  [phone] Patients need to be referred by their primary care doctor.   Medication Assistance: Organization         Address  Phone   Notes  [facility] Med[facility] [address] [place] [address] [place], [place] [address] [phone] --Must be a resident of [facility] -- Must have NO insurance coverage whatsoever (no Medicaid/ Medicare, etc.) -- The pt. MUST have a primary care doctor that directs their care regularly and follows them in the community   MedAssist  [phone]   [employer]  [phone]    Agencies that provide inexpensive medical care: Organization         Address  Phone   Notes  [facility]  [phone]   [facility]    [phone]   [facility] [address] [place], [place] [address] [phone]   [facility] [address] [place] [place] [phone]   Planned Parenthood    [phone]   [facility]    [phone]   Community Health and [facility]  [address], [place] Phone:  [phone], Fax:  [phone]  Hours of Operation:  9 am - 6 pm, M-F.  Also accepts Medicaid/Medicare and self-pay.  [facility]  [address], [place] Phone: [phone]) [phone], Fax: [phone]) [phone]. Hours of Operation:  8:30 am - 5:30 pm, M-F.  Also accepts Medicaid and self-pay.  [facility] [place] [address], [place] Phone: [phone]) [phone]   [facility] [address], [place], [place] [phone], Ext. [phone] Mondays & Thursdays: 7-9 AM.  First 15 patients are seen on a first come, first serve basis.     Medicaid-accepting [facility] Providers:  Organization         Address  Phone   Notes  [facility] [address], [place] [phone]) [phone] Also accepts self-pay patients.  [facility] [address], [place]  [phone]) [phone]   [facility] [address], [place] [phone]   [facility] [address] [place] [address], [place] [phone]) [phone]   Renaye RakersVeita Bland [address], [place]   [phone] Only accepts [place] Access [place]icaid patients after they have their name applied to their card.   Self-Pay (no insurance) in [facility]:  Organization         Address  Phone   Notes  Sickle Cell Patients, [facility] [address], [place] [phone]) [phone]   [facility] [address], [place] [phone]) [phone]   [facility]  [address], [place] [phone]   [facility]/Dr. Osei-Bonsu  [address] [place] [address], [place] or [phone], [place] [phone]) [phone] Phone number for both [place] and [place] locations is the same.  [facility]gent [facility] and [facility] [address], [place] [phone]   [facility] [place] [address] [place] [address], [place] or [address] [phone]) [phone]   [facility] [address], [place] [phone], phone; [phone]) [phone], fax Sees patients 1st and 3rd Saturday of every month.  Must not qualify for public or private insurance (i.e. Medicaid, Medicare, [place] Health Choice, Veterans' Benefits)  Household income should be no more than 200% of the poverty level The clinic cannot treat you if you are pregnant or think you are pregnant  Sexually transmitted diseases are not treated at the clinic.    Dental Care: Organization         Address  Phone  Notes  [facility]  Department of [facility] [address], [place] [phone] Accepts children up to age 75 who are enrolled in [place]icaid or [place] Health Choice; pregnant women with a Medicaid card; and children who have applied for Medicaid or [place] Health Choice, but were declined, whose parents can pay a reduced fee at time of service.  [facility] Department of [facility] [place]  [address], [place] [phone] Accepts children up to age 75 who are enrolled in [place] or [place] Health Choice; pregnant women with a Medicaid card; and children who have applied for Medicaid or [place] Health Choice, but were declined, whose parents can pay a reduced fee at time of service.  [facility]  [address], [place] [phone] Patients are seen by appointment only. Walk-ins are not accepted. [facility] will  see patients 85 years of age and older. Monday - Tuesday (8am-5pm) Most Wednesdays (8:30-5pm) $30 per visit, cash only  [facility] Adult Dental Access PROGRAM  [address], [facility] [phone] Patients are seen by appointment only. Walk-ins are not accepted. [place] will see patients 66 years of age and older. One Wednesday Evening (Monthly: Volunteer Based).  $30 per visit, cash only  [place]  [phone] for adults; Children under age 39, call Graduate Pediatric Dentistry at [phone]. Children aged 45-14, please call [phone] to request a pediatric application.  Dental services are provided in all areas of dental care including fillings, crowns and bridges, complete and partial dentures, implants, gum treatment, root canals, and extractions. Preventive care is also provided. Treatment is provided to both adults and children. Patients are selected via a lottery and there is often a waiting list.   [facility] [address] [place]  [phone]  [email or web address]   [facility] [address] [place], [place] [phone], Ext. [phone] Second and Fourth Thursday of each month, opens at 6:30 AM; Clinic ends at 9 AM.  Patients are seen on a first-come first-served basis, and a limited number are seen during each clinic.   [facility]  [address] Hillard Danker [place], [place] [phone]   Eligibility Requirements You must have lived in [place], [place], or [place] counties for at least the last three months.   You cannot be eligible for state or federal sponsored [employer], including [employer], [place], or [employer].   You generally cannot be eligible for healthcare insurance through your employer.    How to apply: Eligibility screenings are held every Tuesday and Wednesday afternoon from 1:00 pm until 4:00 pm. You do not need an appointment for the interview!  [facility] [address], [place], [place]   [place]  [place] Department  [place]  [phone]    Behavioral Health Resources in the Community: Intensive Outpatient Programs Organization         Address  Phone  Notes  [place]. [address] [place], [place] [phone]   [facility] [address] [place], [place]   ADS: Alcohol & Drug Svcs [address], [place], [place]   [place] [address],  [place], [place] or [phone]   Substance Abuse Resources Organization         Address  Phone  Notes  Alcohol and Drug Services  3255966923   [place]  [phone]   [facility] [place]   Chinita Pester  [phone]   Residential & Outpatient Substance Abuse Program  [phone]   Psychological Services Organization          Address  Phone  Notes  [facility] [place]  [place]  [phone]   [place] [address], [place] or [phone]    Mobile Crisis Teams Organization         Address  Phone  Notes  [facility], Mobile Crisis Care Unit  [phone]   Assertive Psychotherapeutic Services  [address] [place], [place]   [facility] [address] [place] [phone]    Self-Help/Support Groups Organization  Address  Phone             Notes  Mental Health Assoc. of [place] - variety of support groups  [phone] [phone] Call for more information  Narcotics Anonymous (NA), Caring Services [address], [place]  2 meetings at this location   Statisticianesidential Treatment Programs Organization         Address  Phone  Notes  [facility] [address],    [place]  [phone]   [facility]  [address], [place] [phone], [place], [place] [phone]   [facility] [address], [place] [phone] Admissions: 8am-3pm M-F  [facility] [address]    [place], [place] [phone]   [facility] [address], [place], [place] [phone]   [facility] [address]  [place], [place] [phone]   [facility] - Intensive Outpatient [address], [place], [place] [phone]   [facility] ([facility] Recovery [facility] [address]  [place], [place] [phone] or [phone]   [facility] (RTS) [address] [place], [place] [phone] Accepts Medicaid  [facility] [address]  [place] [phone] Substance Abuse/Addiction Treatment   [facility] [place] Behavioral Health Resources Organization         Address  Phone  Notes  [facility]  [phone]   Angie FavaJulie Brannon, PhD [address], [address] [place], [place]   [phone](336) [phone] or [phone]) [phone]   [facility]   [address] [place], [place] [phone](336) [phone]   [facility] [address], [place], [place] [phone]) [phone] Insurance/Medicaid/sponsorship through [facility]  Faith and Families [address]                                    [place], [place] [phone] Therapy/tele-psych/case  [facility] [address].   [place], [place] [phone](336) [phone]    Dr. Lolly MustacheArfeen  [phone](336) [phone]   [facility] [place]  [employer] [facility] 1) [address], [place] 2) [address], [place] 3)  [address], [place] [phone](336) [phone]  [phone](336) [phone]   [facility] Child Abuse Hotline [phone](336) [phone] or [phone]) [phone] (After Hours)
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
## 2013-04-21 NOTE — ED Provider Notes (Signed)
I received this patient in signout from Dr. Nehemiah MassedWaldon pending TTS evaluation.  The patient came in for a fall but did endorse suicidal ideation. He is not happy with his living situation.  Social work and case management evaluated the patient and requested TTS evaluation. TTS is evaluated the patient and states that he does not meet criteria for inpatient management. They report chronic suicidal ideation without plan. Per discussion with Dr. Isaiah BlakesWaldman in signout, patient is stable for discharge home upon TTS evaluation. I was not directly involved in this patient's care appear.  After history, exam, and medical workup I feel the patient has been appropriately medically screened and is safe for discharge home. Pertinent diagnoses were discussed with the patient. Patient was given return precautions.   Shon Batonourtney F Jerrard Bradburn, MD 04/21/13 [phone]

## 2013-04-21 NOTE — ED Provider Notes (Signed)
CSN: [id number]     Arrival date & time 04/20/13  1857 History   First MD Initiated Contact with Patient 04/20/13 2043     Chief Complaint  Patient presents with  . Fall   (Consider location/radiation/quality/duration/timing/severity/associated sxs/prior Treatment) Patient is a 73 y.o. male presenting with fall. The history is provided by the patient.  Fall This is a chronic problem. The current episode started 12 to 24 hours ago. The problem occurs daily. The problem has not changed since onset.Pertinent negatives include no chest pain, no abdominal pain and no shortness of breath. Nothing aggravates the symptoms. Nothing relieves the symptoms. He has tried nothing for the symptoms.    Past Medical History  Diagnosis Date  . Diabetes mellitus   . Hypertension   . Neuropathy   . Mental disorder   . Depression   . Anxiety    History reviewed. No pertinent past surgical history. No family history on file. History  Substance Use Topics  . Smoking status: Current Every Day Smoker -- 2.00 packs/day    Types: Cigarettes  . Smokeless tobacco: Never Used  . Alcohol Use: 2.4 oz/week    4 Cans of beer per week    Review of Systems  Constitutional: Negative for fever.  Respiratory: Negative for cough and shortness of breath.   Cardiovascular: Negative for chest pain.  Gastrointestinal: Negative for vomiting and abdominal pain.  All other systems reviewed and are negative.    Allergies  Nicoderm  Home Medications   Current Outpatient Rx  Name  Route  Sig  Dispense  Refill  . aspirin 81 MG tablet   Oral   Take 1 tablet (81 mg total) by mouth daily. For heart health   30 tablet      . clonazePAM (KLONOPIN) 0.5 MG tablet   Oral   Take 0.25 mg by mouth 2 (two) times daily as needed for anxiety.         . folic acid (FOLVITE) 1 MG tablet   Oral   Take 1 mg by mouth daily.         Marland Kitchen. gabapentin (NEURONTIN) 300 MG capsule   Oral   Take 300 mg by mouth 2 (two) times  daily.         Marland Kitchen. glipiZIDE (GLUCOTROL) 10 MG tablet   Oral   Take 10 mg by mouth daily before breakfast.         . hydrOXYzine (ATARAX/VISTARIL) 25 MG tablet   Oral   Take 25 mg by mouth every 6 (six) hours as needed for itching.         Marland Kitchen. lisinopril (PRINIVIL,ZESTRIL) 5 MG tablet   Oral   Take 5 mg by mouth daily.         . meloxicam (MOBIC) 7.5 MG tablet   Oral   Take 7.5 mg by mouth daily as needed for pain.         . metFORMIN (GLUCOPHAGE) 1000 MG tablet   Oral   Take 1,000 mg by mouth 2 (two) times daily with a meal.         . oxyCODONE-acetaminophen (PERCOCET/ROXICET) 5-325 MG per tablet   Oral   Take 1 tablet by mouth every 6 (six) hours as needed (pain).          . polycarbophil (FIBERCON) 625 MG tablet   Oral   Take 625 mg by mouth daily.         . Polyvinyl Alcohol-Povidone (QC ARTIFICIAL TEARS OP)  Both Eyes   Place 1 drop into both eyes every 6 (six) hours as needed (dryness).         Marland Kitchen senna (SENOKOT) 8.6 MG TABS tablet   Oral   Take 1 tablet by mouth 2 (two) times daily.         . sertraline (ZOLOFT) 100 MG tablet   Oral   Take 100 mg by mouth daily.         Marland Kitchen thiamine (VITAMIN B-1) 100 MG tablet   Oral   Take 100 mg by mouth daily.         . traZODone (DESYREL) 100 MG tablet   Oral   Take 100 mg by mouth at bedtime. For sleep         . trolamine salicylate (ASPERCREME) 10 % cream   Topical   Apply 1 application topically as needed (painful joints).          BP 115/76  Pulse 65  Temp(Src) 98 F (36.7 C) (Oral)  Resp 20  SpO2 95% Physical Exam  Nursing note and vitals reviewed. Constitutional: He is oriented to person, place, and time. He appears well-developed and well-nourished. No distress.  HENT:  Head: Normocephalic and atraumatic.  Mouth/Throat: No oropharyngeal exudate.  Eyes: EOM are normal. Pupils are equal, round, and reactive to light.  Neck: Normal range of motion. Neck supple.  Cardiovascular:  Normal rate and regular rhythm.  Exam reveals no friction rub.   No murmur heard. Pulmonary/Chest: Effort normal and breath sounds normal. No respiratory distress. He has no wheezes. He has no rales.  Abdominal: He exhibits no distension. There is no tenderness. There is no rebound.  Musculoskeletal: Normal range of motion. He exhibits no edema.  Neurological: He is alert and oriented to person, place, and time. No cranial nerve deficit. He exhibits normal muscle tone. Coordination normal.  Skin: No rash noted. He is not diaphoretic.    ED Course  Procedures (including critical care time) Labs Review Labs Reviewed  CBC - Abnormal; Notable for the following:    WBC 11.7 (*)    Hemoglobin 12.7 (*)    HCT 38.3 (*)    RDW 17.7 (*)    All other components within normal limits  BASIC METABOLIC PANEL - Abnormal; Notable for the following:    Sodium 129 (*)    Chloride 93 (*)    Glucose, Bld 138 (*)    GFR calc non Af Amer 88 (*)    All other components within normal limits  URINALYSIS, ROUTINE W REFLEX MICROSCOPIC - Abnormal; Notable for the following:    APPearance CLOUDY (*)    Glucose, UA 250 (*)    All other components within normal limits  SALICYLATE LEVEL - Abnormal; Notable for the following:    Salicylate Lvl <2.0 (*)    All other components within normal limits  ACETAMINOPHEN LEVEL  ETHANOL  URINE RAPID DRUG SCREEN (HOSP PERFORMED)   Imaging Review Dg Chest 2 View  04/20/2013   CLINICAL DATA:  Hypertension and diabetes mellitus  EXAM: CHEST  2 VIEW  COMPARISON:  November 13, 2012  FINDINGS: There is stable mild elevation of the right hemidiaphragm. There is no edema or consolidation. Heart size and pulmonary vascularity are normal. No adenopathy. Aorta is mildly tortuous but stable. There is postoperative change in the lower cervical spine.  IMPRESSION: No edema or consolidation.   Electronically Signed   By: Bretta Bang M.D.   On: 04/20/2013 22:39  EKG Interpretation    None       MDM   1. Suicidal ideation    73 year old male presents stating of fall. He came from [place] [facility] home. He called 911 sitting and fallen. Staff from the home states he has not fallen. He calls out for falls, coming to the ED requesting not to be at his current nursing home. Patient wants to go to the hospital. Patient had a similar ED visit one month ago. He seen by case management who extensively researched what could happen and what is still is capable of. Here was a longer unable to send [facility] to the [place]. [facility]. [facility] is [facility] with the [facility] once she [facility] his [facility] home. He has not done so.  She is alert and oriented. Here he states it is only there was a good [facility]. Unfortunately cannot do this. Patient told me he is suicidal. He said his been suicidal since serving in the non-. He states his is not acutely worsened today, but he is trying to think of a planned. I believe the patient is malingering so he doesn't have to be at his current nursing home. I spoke with case management; she remembers the patient and states to get a TTS consult and if he is deemed appropriate to return home, he can leave. We cannot provide the services the patient is requesting. TTS has been consulted and will see the patient. Patient's labs show mild leukocytosis, which is normal for him. No other labs. Patient's right 2 complaining here    Dagmar Hait, MD 04/21/13 [phone]

## 2013-04-21 NOTE — ED Notes (Signed)
Pt requesting pain medication. Notified Dr. Gwendolyn GrantWalden of pt's request. Pt refused tylenol that was ordered and requested oxycodone.

## 2013-04-21 NOTE — ED Notes (Signed)
[facility] to the facility. Pt and facility agreed to return at this time.

## 2013-08-11 IMAGING — CR DG SHOULDER 2+V*R*
3 series · 3 of 3 positions shown · non-contrast
Comparison: November 19, 2008

CLINICAL DATA: Pain post trauma

RIGHT SHOULDER - 2+ VIEW

[t shoulder external right]
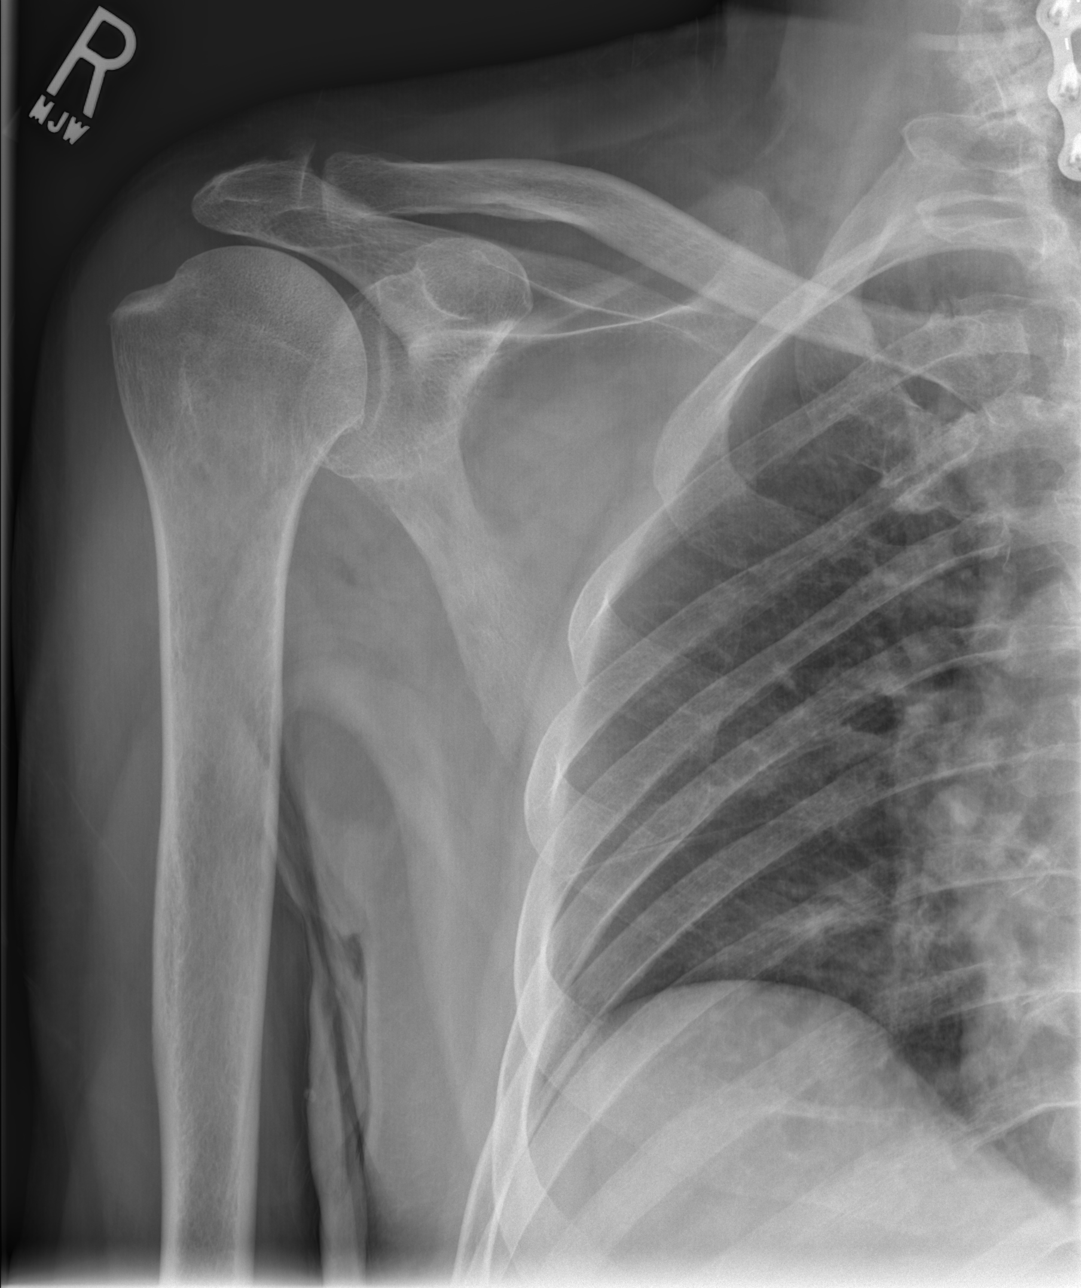

[t shoulder internal right (1 of 2)]
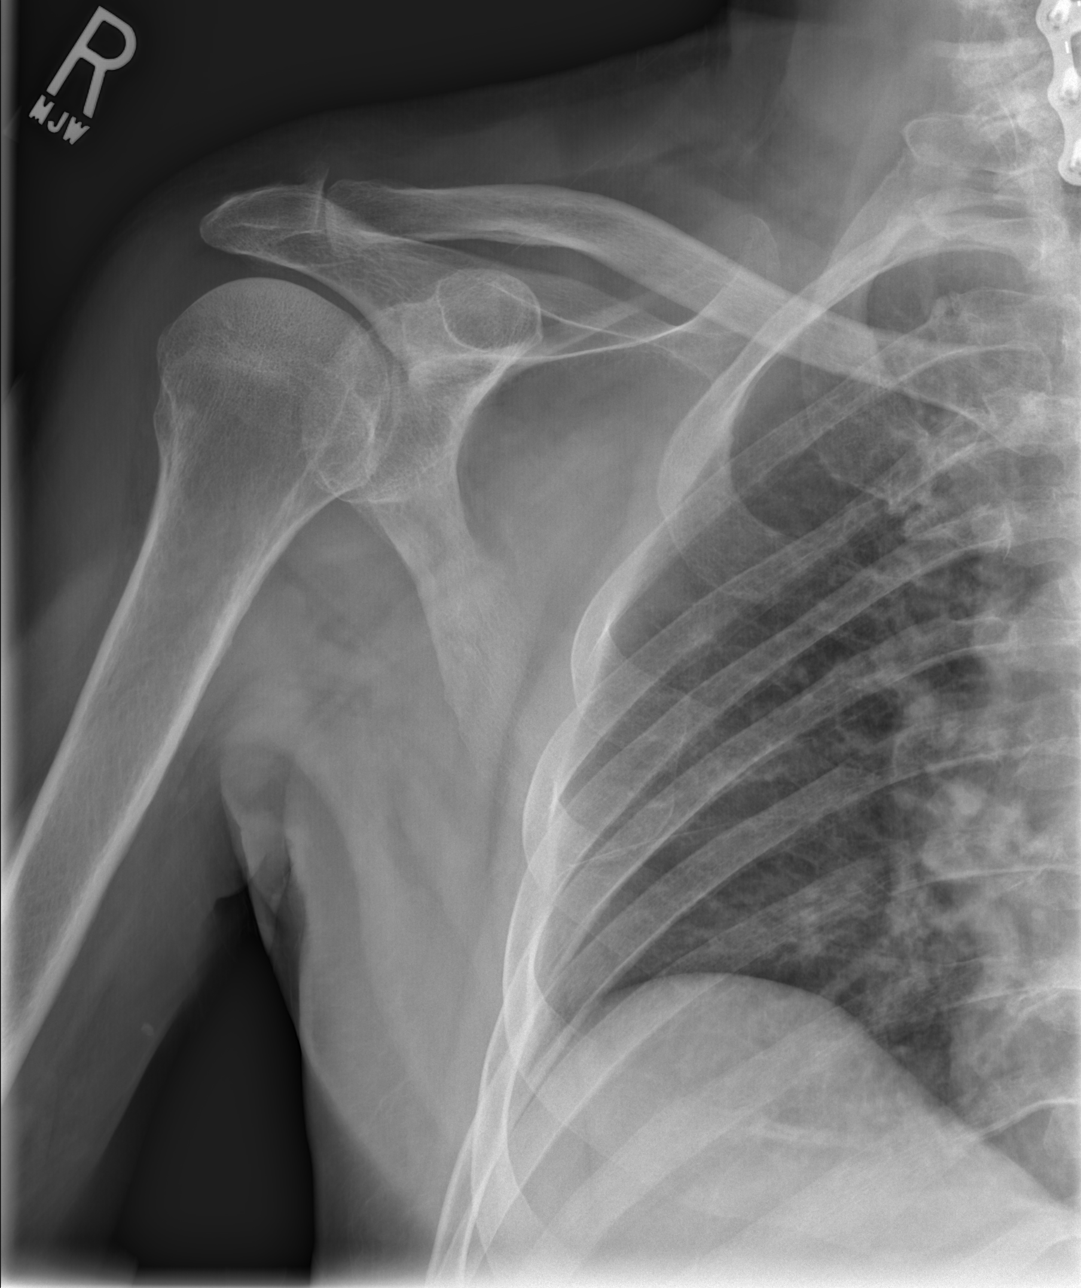

[t shoulder internal right (2 of 2)]
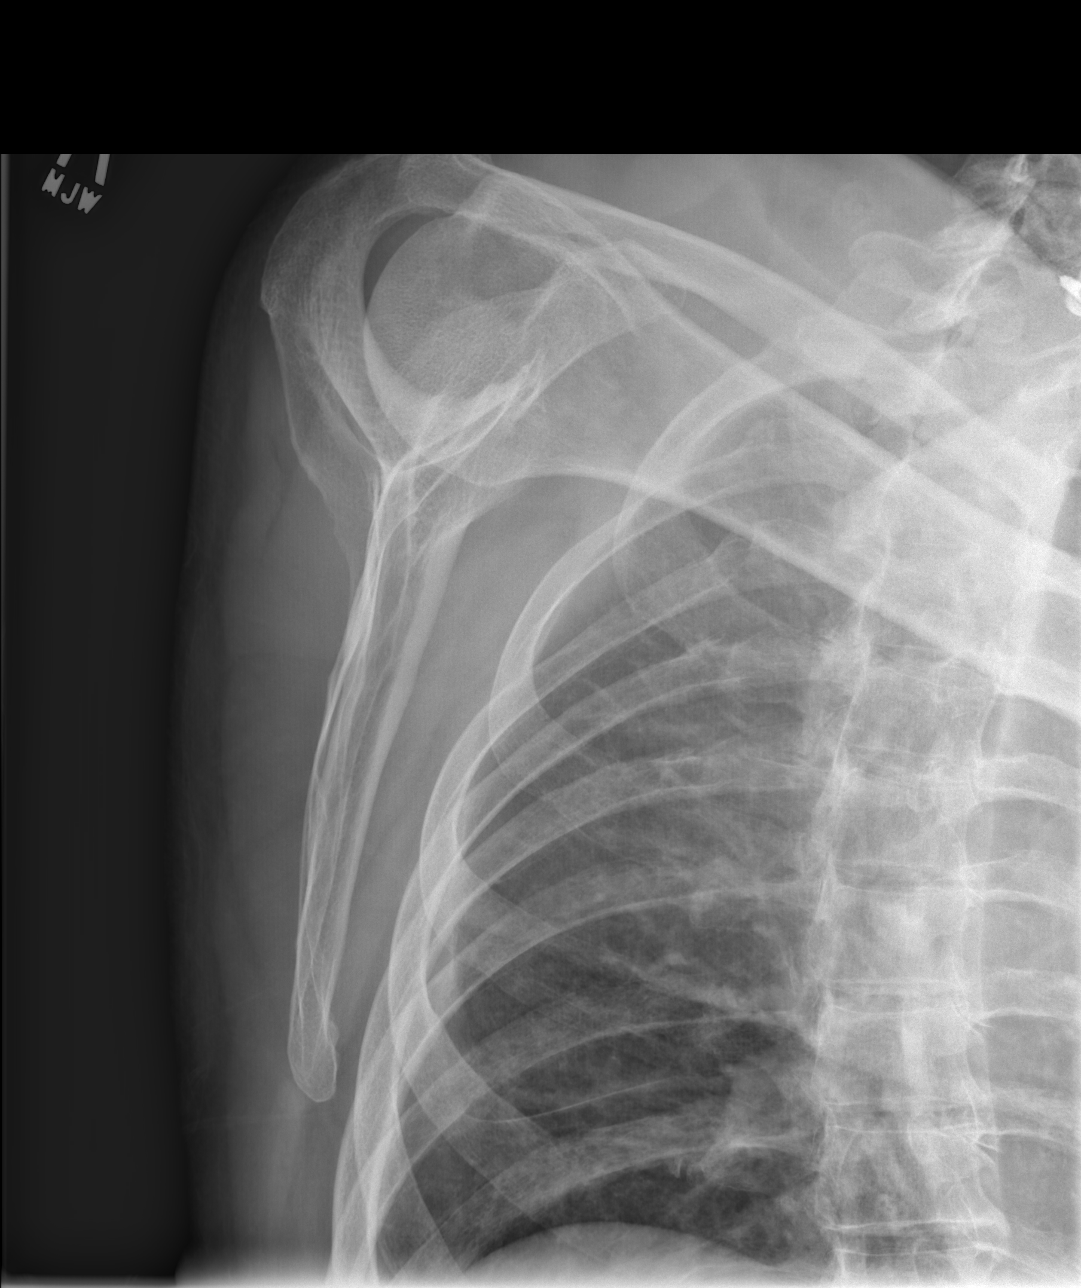

[3 of 3 positions shown; findings below may reference images not displayed]

FINDINGS: Internal rotation, external rotation, and Y scapular
images were obtained.  Fracture or dislocation.  There is moderate
generalized osteoarthritic change, stable.  No erosive change or
intra-articular calcifications.
IMPRESSION: No fracture or dislocation.  Moderate generalized osteoarthritic
change, stable.

## 2013-10-04 ENCOUNTER — Encounter (HOSPITAL_COMMUNITY): Payer: Self-pay | Admitting: Emergency Medicine

## 2013-10-04 ENCOUNTER — Emergency Department (HOSPITAL_COMMUNITY)
Admission: EM | Admit: 2013-10-04 | Discharge: 2013-10-04 | Disposition: A | Discharge status: Home / Self Care | Payer: Medicare Other | Attending: Emergency Medicine | Admitting: Emergency Medicine

## 2013-10-04 DIAGNOSIS — I10 Essential (primary) hypertension: Secondary | ICD-10-CM | POA: Diagnosis not present

## 2013-10-04 DIAGNOSIS — Y929 Unspecified place or not applicable: Secondary | ICD-10-CM | POA: Diagnosis not present

## 2013-10-04 DIAGNOSIS — F3289 Other specified depressive episodes: Secondary | ICD-10-CM | POA: Diagnosis not present

## 2013-10-04 DIAGNOSIS — R296 Repeated falls: Secondary | ICD-10-CM | POA: Diagnosis not present

## 2013-10-04 DIAGNOSIS — E119 Type 2 diabetes mellitus without complications: Secondary | ICD-10-CM | POA: Diagnosis not present

## 2013-10-04 DIAGNOSIS — Z043 Encounter for examination and observation following other accident: Secondary | ICD-10-CM | POA: Diagnosis present

## 2013-10-04 DIAGNOSIS — G608 Other hereditary and idiopathic neuropathies: Secondary | ICD-10-CM | POA: Diagnosis not present

## 2013-10-04 DIAGNOSIS — W19XXXA Unspecified fall, initial encounter: Secondary | ICD-10-CM

## 2013-10-04 DIAGNOSIS — F411 Generalized anxiety disorder: Secondary | ICD-10-CM | POA: Diagnosis not present

## 2013-10-04 DIAGNOSIS — F329 Major depressive disorder, single episode, unspecified: Secondary | ICD-10-CM | POA: Diagnosis not present

## 2013-10-04 DIAGNOSIS — Z7982 Long term (current) use of aspirin: Secondary | ICD-10-CM | POA: Diagnosis not present

## 2013-10-04 DIAGNOSIS — Y9389 Activity, other specified: Secondary | ICD-10-CM | POA: Diagnosis not present

## 2013-10-04 DIAGNOSIS — F17200 Nicotine dependence, unspecified, uncomplicated: Secondary | ICD-10-CM | POA: Insufficient documentation

## 2013-10-04 DIAGNOSIS — Z79899 Other long term (current) drug therapy: Secondary | ICD-10-CM | POA: Insufficient documentation

## 2013-10-04 HISTORY — DX: Post-traumatic stress disorder, unspecified: F43.10

## 2013-10-04 NOTE — Discharge Instructions (Signed)

## 2013-10-04 NOTE — ED Notes (Signed)
Discharge instructions reviewed with pt, questions answered. Pt verbalized understanding.  

## 2013-10-04 NOTE — ED Notes (Signed)
Pt brought in by [facility] from [facility]. Pt states that he wanted to come to ER because he did not want to live at this facility. Pt states he fell earlier and co pain to rt side of body.
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
## 2013-10-04 NOTE — ED Notes (Signed)
Called [facility], spoke with StatisticianCrystal RN, gave report, they will send a car to retrieve the pt.

## 2013-10-04 NOTE — ED Notes (Signed)
EDP in room at this time 
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
## 2020-05-16 DEATH — deceased
